# Patient Record
Sex: Male | Born: 1945
Health system: Southern US, Community
[De-identification: ages and names within clinical notes are randomized; demographics above are authoritative.]

## PROBLEM LIST (undated history)

## (undated) DIAGNOSIS — I2510 Atherosclerotic heart disease of native coronary artery without angina pectoris: Secondary | ICD-10-CM

## (undated) DIAGNOSIS — M51379 Other intervertebral disc degeneration, lumbosacral region without mention of lumbar back pain or lower extremity pain: Secondary | ICD-10-CM

## (undated) DIAGNOSIS — K219 Gastro-esophageal reflux disease without esophagitis: Secondary | ICD-10-CM

## (undated) DIAGNOSIS — M5137 Other intervertebral disc degeneration, lumbosacral region: Secondary | ICD-10-CM

## (undated) DIAGNOSIS — M779 Enthesopathy, unspecified: Secondary | ICD-10-CM

## (undated) DIAGNOSIS — Z87442 Personal history of urinary calculi: Secondary | ICD-10-CM

## (undated) DIAGNOSIS — N200 Calculus of kidney: Secondary | ICD-10-CM

## (undated) DIAGNOSIS — I10 Essential (primary) hypertension: Secondary | ICD-10-CM

## (undated) DIAGNOSIS — J301 Allergic rhinitis due to pollen: Secondary | ICD-10-CM

## (undated) DIAGNOSIS — E785 Hyperlipidemia, unspecified: Secondary | ICD-10-CM

## (undated) HISTORY — DX: Personal history of urinary calculi: Z87.442

## (undated) HISTORY — DX: Other intervertebral disc degeneration, lumbosacral region: M51.37

## (undated) HISTORY — DX: Atherosclerotic heart disease of native coronary artery without angina pectoris: I25.10

## (undated) HISTORY — PX: FOOT SURGERY: SHX648

## (undated) HISTORY — PX: TONSILLECTOMY: SHX5217

## (undated) HISTORY — DX: Hyperlipidemia, unspecified: E78.5

## (undated) HISTORY — DX: Allergic rhinitis due to pollen: J30.1

## (undated) HISTORY — DX: Other intervertebral disc degeneration, lumbosacral region without mention of lumbar back pain or lower extremity pain: M51.379

## (undated) HISTORY — DX: Essential (primary) hypertension: I10

## (undated) HISTORY — DX: Enthesopathy, unspecified: M77.9

## (undated) HISTORY — DX: Calculus of kidney: N20.0

## (undated) HISTORY — DX: Gastro-esophageal reflux disease without esophagitis: K21.9

## (undated) HISTORY — PX: OTHER SURGICAL HISTORY: SHX169

---

## 1999-09-15 ENCOUNTER — Emergency Department (HOSPITAL_COMMUNITY): Admission: EM | Admit: 1999-09-15 | Discharge: 1999-09-15 | Payer: Self-pay | Admitting: Emergency Medicine

## 1999-09-16 ENCOUNTER — Encounter: Payer: Self-pay | Admitting: Emergency Medicine

## 2004-03-06 ENCOUNTER — Ambulatory Visit (HOSPITAL_COMMUNITY): Admission: RE | Admit: 2004-03-06 | Discharge: 2004-03-06 | Payer: Self-pay | Admitting: Gastroenterology

## 2004-08-06 ENCOUNTER — Ambulatory Visit: Payer: Self-pay | Admitting: Internal Medicine

## 2004-09-10 ENCOUNTER — Ambulatory Visit: Payer: Self-pay | Admitting: Internal Medicine

## 2004-09-15 ENCOUNTER — Ambulatory Visit: Payer: Self-pay | Admitting: Internal Medicine

## 2004-10-08 ENCOUNTER — Ambulatory Visit: Payer: Self-pay | Admitting: Internal Medicine

## 2005-10-23 ENCOUNTER — Ambulatory Visit: Payer: Self-pay | Admitting: Internal Medicine

## 2005-10-29 ENCOUNTER — Ambulatory Visit: Payer: Self-pay | Admitting: Internal Medicine

## 2006-11-19 ENCOUNTER — Ambulatory Visit: Payer: Self-pay | Admitting: Internal Medicine

## 2006-11-19 LAB — CONVERTED CEMR LAB
ALT: 24 units/L (ref 0–40)
AST: 25 units/L (ref 0–37)
Albumin: 3.4 g/dL — ABNORMAL LOW (ref 3.5–5.2)
Alkaline Phosphatase: 74 units/L (ref 39–117)
BUN: 18 mg/dL (ref 6–23)
Basophils Absolute: 0.1 10*3/uL (ref 0.0–0.1)
Basophils Relative: 0.9 % (ref 0.0–1.0)
Bilirubin Urine: NEGATIVE
Bilirubin, Direct: 0.2 mg/dL (ref 0.0–0.3)
CO2: 29 meq/L (ref 19–32)
Calcium: 8.7 mg/dL (ref 8.4–10.5)
Chloride: 111 meq/L (ref 96–112)
Cholesterol: 162 mg/dL (ref 0–200)
Creatinine, Ser: 0.8 mg/dL (ref 0.4–1.5)
Eosinophils Absolute: 0.4 10*3/uL (ref 0.0–0.6)
Eosinophils Relative: 6.4 % — ABNORMAL HIGH (ref 0.0–5.0)
GFR calc Af Amer: 127 mL/min
GFR calc non Af Amer: 105 mL/min
Glucose, Bld: 110 mg/dL — ABNORMAL HIGH (ref 70–99)
HCT: 40.6 % (ref 39.0–52.0)
HDL: 45.6 mg/dL (ref >39.0)
Hemoglobin, Urine: NEGATIVE
Hemoglobin: 13.8 g/dL (ref 13.0–17.0)
Ketones, ur: NEGATIVE mg/dL
LDL Cholesterol: 103 mg/dL — ABNORMAL HIGH (ref 0–99)
Leukocytes, UA: NEGATIVE
Lymphocytes Relative: 36.5 % (ref 12.0–46.0)
MCHC: 34.1 g/dL (ref 30.0–36.0)
MCV: 91.3 fL (ref 78.0–100.0)
Monocytes Absolute: 0.6 10*3/uL (ref 0.2–0.7)
Monocytes Relative: 9 % (ref 3.0–11.0)
Neutro Abs: 3 10*3/uL (ref 1.4–7.7)
Neutrophils Relative %: 47.2 % (ref 43.0–77.0)
Nitrite: NEGATIVE
PSA: 0.96 ng/mL (ref 0.10–4.00)
Platelets: 254 10*3/uL (ref 150–400)
Potassium: 4 meq/L (ref 3.5–5.1)
RBC: 4.45 M/uL (ref 4.22–5.81)
RDW: 13.1 % (ref 11.5–14.6)
Sodium: 144 meq/L (ref 135–145)
Specific Gravity, Urine: 1.02 (ref 1.000–1.03)
TSH: 2.25 microintl units/mL (ref 0.35–5.50)
Total Bilirubin: 1.5 mg/dL — ABNORMAL HIGH (ref 0.3–1.2)
Total CHOL/HDL Ratio: 3.6
Total Protein, Urine: NEGATIVE mg/dL
Total Protein: 6.6 g/dL (ref 6.0–8.3)
Triglycerides: 69 mg/dL (ref 0–149)
Urine Glucose: NEGATIVE mg/dL
Urobilinogen, UA: 0.2 (ref 0.0–1.0)
VLDL: 14 mg/dL (ref 0–40)
WBC: 6.5 10*3/uL (ref 4.5–10.5)
pH: 7 (ref 5.0–8.0)

## 2006-11-25 ENCOUNTER — Ambulatory Visit: Payer: Self-pay | Admitting: Internal Medicine

## 2007-11-12 DIAGNOSIS — M51379 Other intervertebral disc degeneration, lumbosacral region without mention of lumbar back pain or lower extremity pain: Secondary | ICD-10-CM | POA: Insufficient documentation

## 2007-11-12 DIAGNOSIS — K219 Gastro-esophageal reflux disease without esophagitis: Secondary | ICD-10-CM | POA: Insufficient documentation

## 2007-11-12 DIAGNOSIS — Z9089 Acquired absence of other organs: Secondary | ICD-10-CM | POA: Insufficient documentation

## 2007-11-12 DIAGNOSIS — Z87442 Personal history of urinary calculi: Secondary | ICD-10-CM | POA: Insufficient documentation

## 2007-11-12 DIAGNOSIS — M5137 Other intervertebral disc degeneration, lumbosacral region: Secondary | ICD-10-CM | POA: Insufficient documentation

## 2007-11-12 DIAGNOSIS — J301 Allergic rhinitis due to pollen: Secondary | ICD-10-CM | POA: Insufficient documentation

## 2007-11-12 DIAGNOSIS — M779 Enthesopathy, unspecified: Secondary | ICD-10-CM | POA: Insufficient documentation

## 2007-11-12 DIAGNOSIS — E785 Hyperlipidemia, unspecified: Secondary | ICD-10-CM | POA: Insufficient documentation

## 2007-12-05 ENCOUNTER — Encounter: Payer: Self-pay | Admitting: Internal Medicine

## 2008-02-02 ENCOUNTER — Ambulatory Visit: Payer: Self-pay | Admitting: Internal Medicine

## 2008-02-02 LAB — CONVERTED CEMR LAB
ALT: 21 units/L (ref 0–53)
AST: 21 units/L (ref 0–37)
Albumin: 3.5 g/dL (ref 3.5–5.2)
Alkaline Phosphatase: 75 units/L (ref 39–117)
BUN: 19 mg/dL (ref 6–23)
Basophils Absolute: 0 10*3/uL (ref 0.0–0.1)
Basophils Relative: 0.6 % (ref 0.0–1.0)
Bilirubin Urine: NEGATIVE
Bilirubin, Direct: 0.2 mg/dL (ref 0.0–0.3)
CO2: 31 meq/L (ref 19–32)
Calcium: 9.3 mg/dL (ref 8.4–10.5)
Chloride: 105 meq/L (ref 96–112)
Cholesterol: 164 mg/dL (ref 0–200)
Creatinine, Ser: 0.8 mg/dL (ref 0.4–1.5)
Eosinophils Absolute: 0.3 10*3/uL (ref 0.0–0.7)
Eosinophils Relative: 4.6 % (ref 0.0–5.0)
GFR calc Af Amer: 126 mL/min
GFR calc non Af Amer: 104 mL/min
Glucose, Bld: 114 mg/dL — ABNORMAL HIGH (ref 70–99)
HCT: 41.8 % (ref 39.0–52.0)
HDL: 43 mg/dL (ref >39.0)
Hemoglobin, Urine: NEGATIVE
Hemoglobin: 14.2 g/dL (ref 13.0–17.0)
Ketones, ur: NEGATIVE mg/dL
LDL Cholesterol: 105 mg/dL — ABNORMAL HIGH (ref 0–99)
Leukocytes, UA: NEGATIVE
Lymphocytes Relative: 38 % (ref 12.0–46.0)
MCHC: 34.1 g/dL (ref 30.0–36.0)
MCV: 91.6 fL (ref 78.0–100.0)
Monocytes Absolute: 0.6 10*3/uL (ref 0.1–1.0)
Monocytes Relative: 8.3 % (ref 3.0–12.0)
Neutro Abs: 3.6 10*3/uL (ref 1.4–7.7)
Neutrophils Relative %: 48.5 % (ref 43.0–77.0)
Nitrite: NEGATIVE
PSA: 0.92 ng/mL (ref 0.10–4.00)
Platelets: 252 10*3/uL (ref 150–400)
Potassium: 4.2 meq/L (ref 3.5–5.1)
RBC: 4.56 M/uL (ref 4.22–5.81)
RDW: 13.5 % (ref 11.5–14.6)
Sodium: 141 meq/L (ref 135–145)
Specific Gravity, Urine: >=1.03 (ref 1.000–1.03)
TSH: 1.98 microintl units/mL (ref 0.35–5.50)
Total Bilirubin: 1.5 mg/dL — ABNORMAL HIGH (ref 0.3–1.2)
Total CHOL/HDL Ratio: 3.8
Total Protein, Urine: NEGATIVE mg/dL
Total Protein: 7 g/dL (ref 6.0–8.3)
Triglycerides: 78 mg/dL (ref 0–149)
Urine Glucose: NEGATIVE mg/dL
Urobilinogen, UA: 0.2 (ref 0.0–1.0)
VLDL: 16 mg/dL (ref 0–40)
WBC: 7.2 10*3/uL (ref 4.5–10.5)
pH: 5.5 (ref 5.0–8.0)

## 2008-02-08 ENCOUNTER — Ambulatory Visit: Payer: Self-pay | Admitting: Internal Medicine

## 2008-02-17 ENCOUNTER — Telehealth (INDEPENDENT_AMBULATORY_CARE_PROVIDER_SITE_OTHER): Payer: Self-pay | Admitting: *Deleted

## 2008-02-20 ENCOUNTER — Ambulatory Visit: Payer: Self-pay | Admitting: Internal Medicine

## 2008-02-20 DIAGNOSIS — M542 Cervicalgia: Secondary | ICD-10-CM | POA: Insufficient documentation

## 2008-02-21 ENCOUNTER — Encounter: Admission: RE | Admit: 2008-02-21 | Discharge: 2008-02-21 | Payer: Self-pay | Admitting: Internal Medicine

## 2008-02-26 ENCOUNTER — Encounter: Payer: Self-pay | Admitting: Internal Medicine

## 2008-07-31 ENCOUNTER — Ambulatory Visit: Payer: Self-pay | Admitting: Internal Medicine

## 2008-09-05 ENCOUNTER — Telehealth: Payer: Self-pay | Admitting: Internal Medicine

## 2008-11-22 ENCOUNTER — Encounter: Payer: Self-pay | Admitting: Internal Medicine

## 2009-01-10 IMAGING — CR DG CERVICAL SPINE WITH FLEX & EXTEND
7 series · 7 of 7 positions shown · non-contrast
Comparison: None

CLINICAL DATA: Next a dense

CERVICAL SPINE COMPLETE WITH FLEXION AND EXTENSION VIEWS

[view not recorded (1 of 7)]
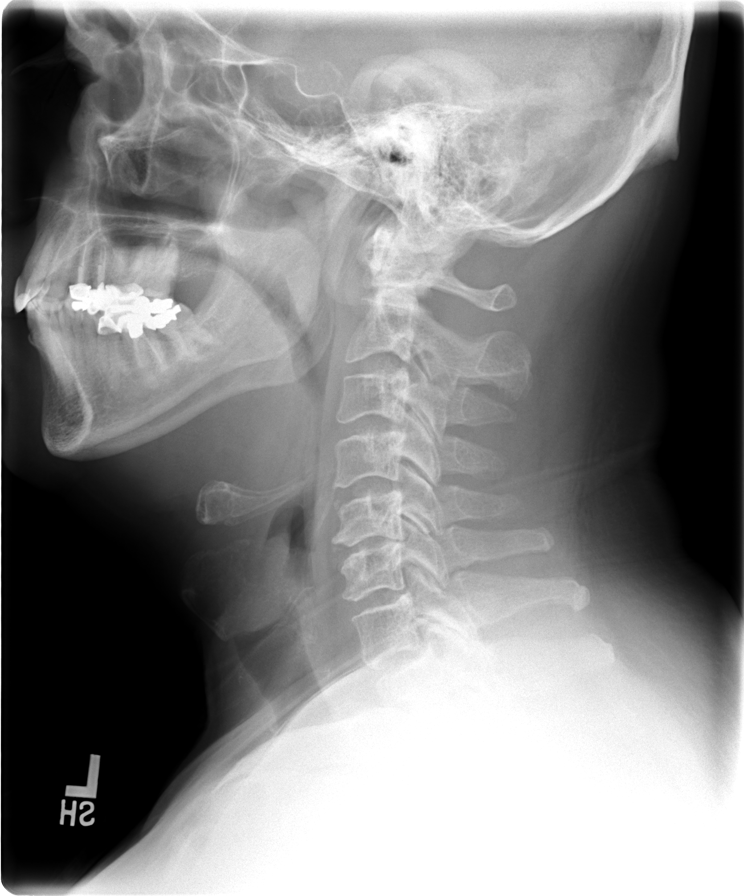

[view not recorded (2 of 7)]
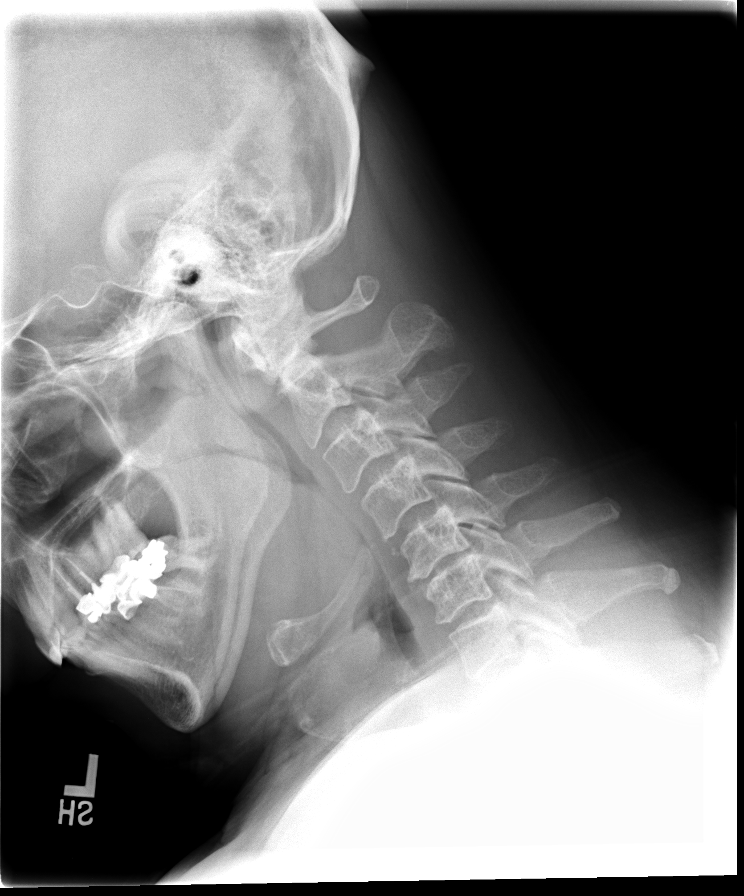

[view not recorded (3 of 7)]
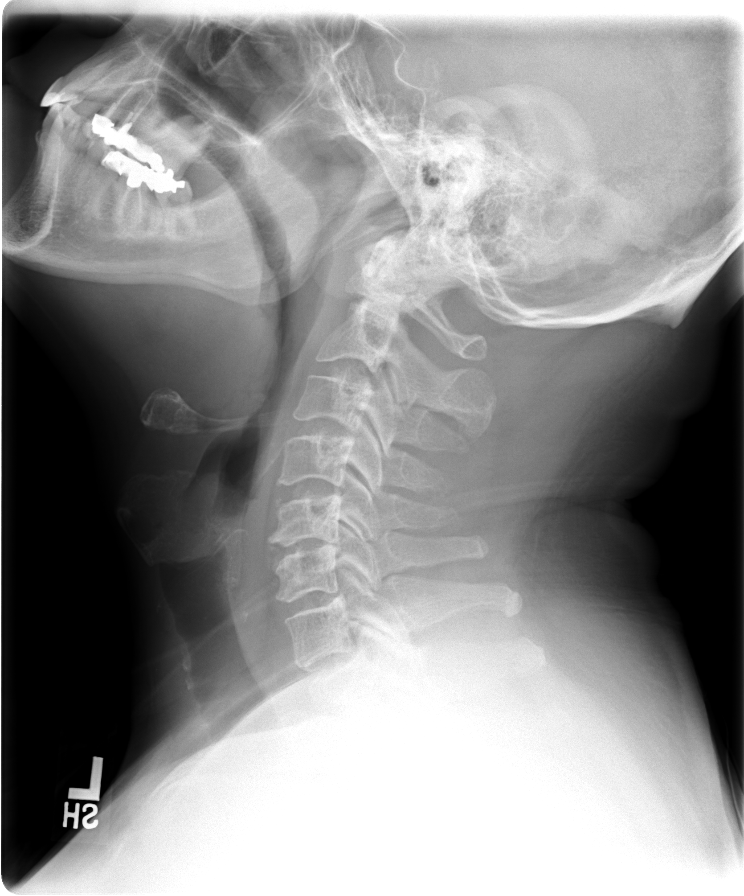

[view not recorded (4 of 7)]
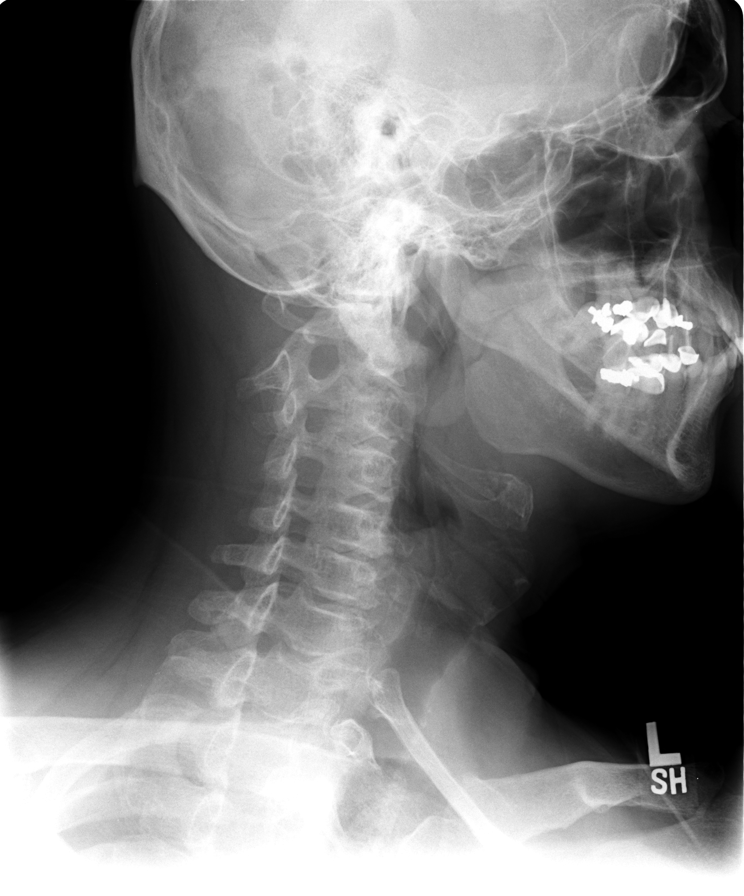

[view not recorded (5 of 7)]
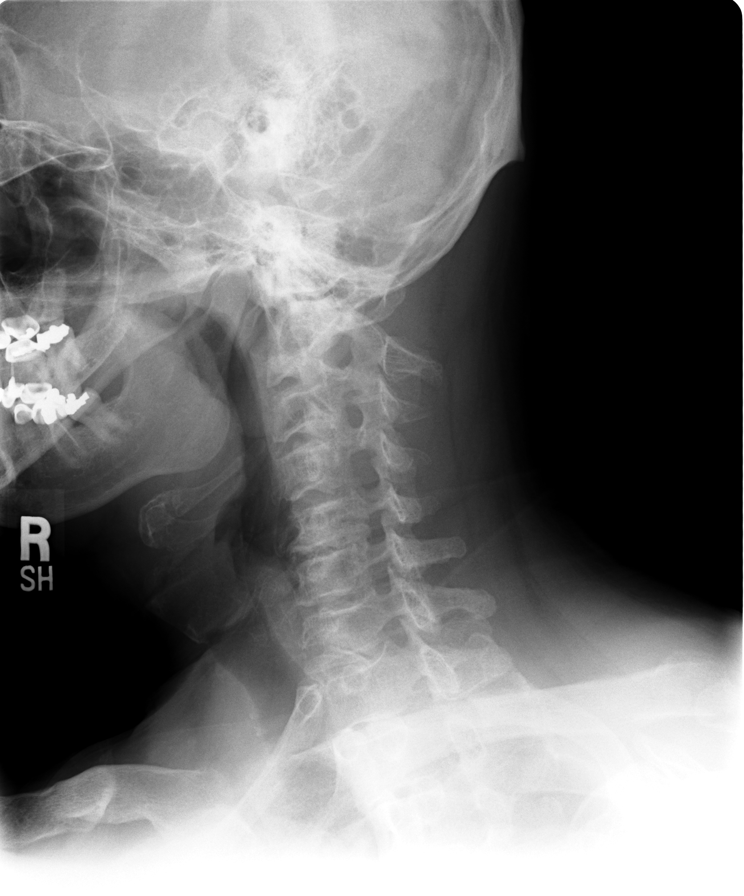

[view not recorded (6 of 7)]
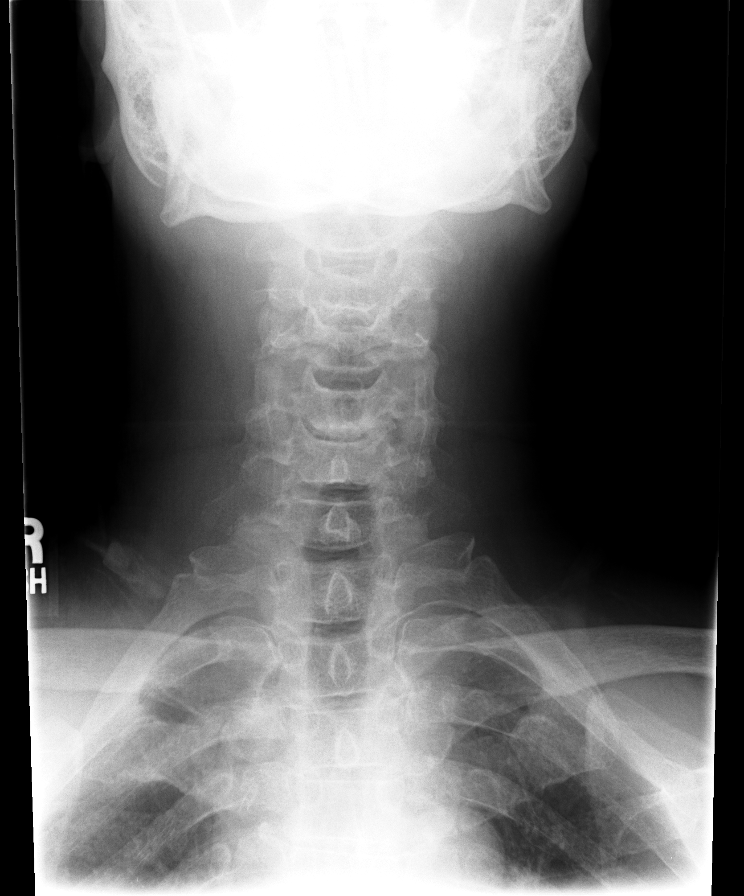

[view not recorded (7 of 7)]
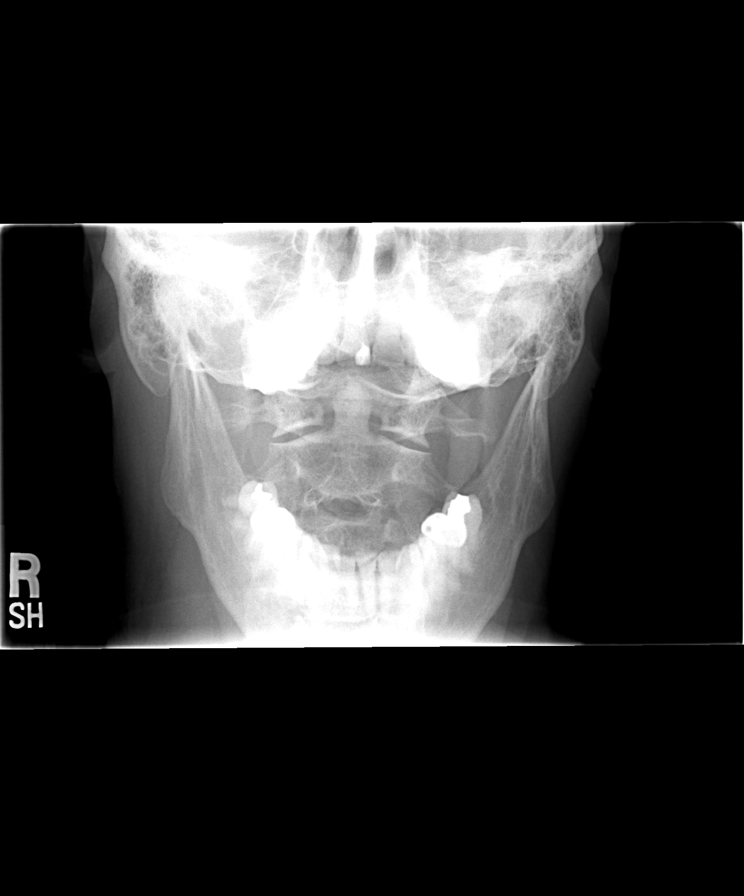

[7 of 7 positions shown; findings below may reference images not displayed]

FINDINGS: There is severe narrowing of the C5-C6 disc.  There is no
vertebral body height loss.  There is anatomic alignment.  Mild
narrowing at C6-7 is noted.  Flexion and extension views
demonstrate slight anterolisthesis at C4-5 upon flexion.  No
obvious instability is noted.  Mild narrowing of the left C5-6
foramen is noted due to uncovertebral osteophytes.  The odontoid is
intact.  No obvious fractures.  Soft tissues are within normal
limits.
IMPRESSION: Degenerative disc disease at C5-6 and to a lesser degree C6-7 as
described.

## 2009-02-11 ENCOUNTER — Ambulatory Visit: Payer: Self-pay | Admitting: Internal Medicine

## 2009-02-11 LAB — CONVERTED CEMR LAB
ALT: 20 units/L (ref 0–53)
AST: 23 units/L (ref 0–37)
Albumin: 3.5 g/dL (ref 3.5–5.2)
Alkaline Phosphatase: 72 units/L (ref 39–117)
BUN: 18 mg/dL (ref 6–23)
Basophils Absolute: 0 10*3/uL (ref 0.0–0.1)
Basophils Relative: 0.5 % (ref 0.0–3.0)
Bilirubin Urine: NEGATIVE
Bilirubin, Direct: 0.2 mg/dL (ref 0.0–0.3)
CO2: 31 meq/L (ref 19–32)
Calcium: 9 mg/dL (ref 8.4–10.5)
Chloride: 103 meq/L (ref 96–112)
Cholesterol: 181 mg/dL (ref 0–200)
Creatinine, Ser: 0.8 mg/dL (ref 0.4–1.5)
Eosinophils Absolute: 0.6 10*3/uL (ref 0.0–0.7)
Eosinophils Relative: 8.6 % — ABNORMAL HIGH (ref 0.0–5.0)
GFR calc non Af Amer: 104.02 mL/min (ref >60)
Glucose, Bld: 108 mg/dL — ABNORMAL HIGH (ref 70–99)
HCT: 40.7 % (ref 39.0–52.0)
HDL: 47.7 mg/dL (ref >39.00)
Hemoglobin, Urine: NEGATIVE
Hemoglobin: 14.3 g/dL (ref 13.0–17.0)
Ketones, ur: NEGATIVE mg/dL
LDL Cholesterol: 120 mg/dL — ABNORMAL HIGH (ref 0–99)
Leukocytes, UA: NEGATIVE
Lymphocytes Relative: 36.3 % (ref 12.0–46.0)
Lymphs Abs: 2.5 10*3/uL (ref 0.7–4.0)
MCHC: 35.2 g/dL (ref 30.0–36.0)
MCV: 90.8 fL (ref 78.0–100.0)
Monocytes Absolute: 0.6 10*3/uL (ref 0.1–1.0)
Monocytes Relative: 9.2 % (ref 3.0–12.0)
Neutro Abs: 3.1 10*3/uL (ref 1.4–7.7)
Neutrophils Relative %: 45.4 % (ref 43.0–77.0)
Nitrite: NEGATIVE
PSA: 0.89 ng/mL (ref 0.10–4.00)
Platelets: 230 10*3/uL (ref 150.0–400.0)
Potassium: 4.1 meq/L (ref 3.5–5.1)
RBC: 4.48 M/uL (ref 4.22–5.81)
RDW: 13.3 % (ref 11.5–14.6)
Sodium: 140 meq/L (ref 135–145)
Specific Gravity, Urine: 1.02 (ref 1.000–1.030)
TSH: 4.32 microintl units/mL (ref 0.35–5.50)
Total Bilirubin: 1.4 mg/dL — ABNORMAL HIGH (ref 0.3–1.2)
Total CHOL/HDL Ratio: 4
Total Protein, Urine: NEGATIVE mg/dL
Total Protein: 7.2 g/dL (ref 6.0–8.3)
Triglycerides: 67 mg/dL (ref 0.0–149.0)
Urine Glucose: NEGATIVE mg/dL
Urobilinogen, UA: 0.2 (ref 0.0–1.0)
VLDL: 13.4 mg/dL (ref 0.0–40.0)
WBC: 6.8 10*3/uL (ref 4.5–10.5)
pH: 6.5 (ref 5.0–8.0)

## 2009-02-15 ENCOUNTER — Ambulatory Visit: Payer: Self-pay | Admitting: Internal Medicine

## 2009-04-11 ENCOUNTER — Telehealth: Payer: Self-pay | Admitting: Internal Medicine

## 2009-08-30 ENCOUNTER — Telehealth: Payer: Self-pay | Admitting: Internal Medicine

## 2009-09-11 ENCOUNTER — Telehealth: Payer: Self-pay | Admitting: Internal Medicine

## 2010-02-11 ENCOUNTER — Ambulatory Visit: Payer: Self-pay | Admitting: Internal Medicine

## 2010-02-11 LAB — CONVERTED CEMR LAB
ALT: 99 units/L — ABNORMAL HIGH (ref 0–53)
AST: 45 units/L — ABNORMAL HIGH (ref 0–37)
Albumin: 3.5 g/dL (ref 3.5–5.2)
Alkaline Phosphatase: 180 units/L — ABNORMAL HIGH (ref 39–117)
BUN: 21 mg/dL (ref 6–23)
Basophils Absolute: 0.1 10*3/uL (ref 0.0–0.1)
Basophils Relative: 1 % (ref 0.0–3.0)
Bilirubin Urine: NEGATIVE
Bilirubin, Direct: 0.2 mg/dL (ref 0.0–0.3)
CO2: 29 meq/L (ref 19–32)
Calcium: 8.7 mg/dL (ref 8.4–10.5)
Chloride: 108 meq/L (ref 96–112)
Cholesterol: 157 mg/dL (ref 0–200)
Creatinine, Ser: 0.9 mg/dL (ref 0.4–1.5)
Eosinophils Absolute: 0.7 10*3/uL (ref 0.0–0.7)
Eosinophils Relative: 10.7 % — ABNORMAL HIGH (ref 0.0–5.0)
GFR calc non Af Amer: 94.12 mL/min (ref >60)
Glucose, Bld: 108 mg/dL — ABNORMAL HIGH (ref 70–99)
HCT: 40.8 % (ref 39.0–52.0)
HDL: 44.4 mg/dL (ref >39.00)
Hemoglobin, Urine: NEGATIVE
Hemoglobin: 14 g/dL (ref 13.0–17.0)
Ketones, ur: NEGATIVE mg/dL
LDL Cholesterol: 91 mg/dL (ref 0–99)
Leukocytes, UA: NEGATIVE
Lymphocytes Relative: 34.1 % (ref 12.0–46.0)
Lymphs Abs: 2.3 10*3/uL (ref 0.7–4.0)
MCHC: 34.3 g/dL (ref 30.0–36.0)
MCV: 91.5 fL (ref 78.0–100.0)
Monocytes Absolute: 0.8 10*3/uL (ref 0.1–1.0)
Monocytes Relative: 11.7 % (ref 3.0–12.0)
Neutro Abs: 2.9 10*3/uL (ref 1.4–7.7)
Neutrophils Relative %: 42.5 % — ABNORMAL LOW (ref 43.0–77.0)
Nitrite: NEGATIVE
PSA: 1.17 ng/mL (ref 0.10–4.00)
Platelets: 247 10*3/uL (ref 150.0–400.0)
Potassium: 4.4 meq/L (ref 3.5–5.1)
RBC: 4.46 M/uL (ref 4.22–5.81)
RDW: 14.9 % — ABNORMAL HIGH (ref 11.5–14.6)
Sodium: 143 meq/L (ref 135–145)
Specific Gravity, Urine: >=1.03 (ref 1.000–1.030)
TSH: 3.59 microintl units/mL (ref 0.35–5.50)
Total Bilirubin: 1 mg/dL (ref 0.3–1.2)
Total CHOL/HDL Ratio: 4
Total Protein, Urine: NEGATIVE mg/dL
Total Protein: 6.9 g/dL (ref 6.0–8.3)
Triglycerides: 110 mg/dL (ref 0.0–149.0)
Urine Glucose: NEGATIVE mg/dL
Urobilinogen, UA: 0.2 (ref 0.0–1.0)
VLDL: 22 mg/dL (ref 0.0–40.0)
WBC: 6.8 10*3/uL (ref 4.5–10.5)
pH: 5.5 (ref 5.0–8.0)

## 2010-02-17 ENCOUNTER — Ambulatory Visit: Payer: Self-pay | Admitting: Internal Medicine

## 2010-08-19 ENCOUNTER — Ambulatory Visit: Payer: Self-pay | Admitting: Internal Medicine

## 2010-08-24 HISTORY — PX: SHOULDER ARTHROSCOPY: SHX128

## 2010-09-24 ENCOUNTER — Ambulatory Visit (INDEPENDENT_AMBULATORY_CARE_PROVIDER_SITE_OTHER): Payer: BC Managed Care – PPO | Admitting: Internal Medicine

## 2010-09-24 ENCOUNTER — Encounter: Payer: Self-pay | Admitting: Internal Medicine

## 2010-09-24 DIAGNOSIS — L02419 Cutaneous abscess of limb, unspecified: Secondary | ICD-10-CM

## 2010-09-24 DIAGNOSIS — L03119 Cellulitis of unspecified part of limb: Secondary | ICD-10-CM

## 2010-09-25 NOTE — Progress Notes (Signed)
  Phone Note Call from Patient Call back at Home Phone [phone]   Caller: Patient Call For: Dr Debby Bud Summary of Call: Pt states he needs Nexium, [facility] [address], [address] [phone]. This is his new pharmacy not [facility]. Initial call taken by: Verdell Face,  September 11, 2009 9:53 AM    Prescriptions: NEXIUM 40 MG  CPDR (ESOMEPRAZOLE MAGNESIUM) Take 1 tablet by mouth every morning  #90 x 1   Entered by:   Ami Bullins CMA   Authorized by:   Jacques Navy MD   Signed by:   Bill Salinas CMA on 09/11/2009   Method used:   Electronically to        [id number] ([facility]ail[facility]       [address] [place], [place]  [address]       Ph: [phone]       Fax: [phone]   RxID:   [id number]

## 2010-09-25 NOTE — Progress Notes (Signed)
  Phone Note Refill Request Message from:  Fax from Pharmacy on August 30, 2009 9:13 AM  Refills Requested: Medication #1:  CRESTOR 5 MG TABS Take 1 tablet by mouth once a day. Initial call taken by: Ami Bullins CMA,  August 30, 2009 9:13 AM    Prescriptions: CRESTOR 5 MG TABS (ROSUVASTATIN CALCIUM) Take 1 tablet by mouth once a day  #90 x 3   Entered by:   Ami Bullins CMA   Authorized by:   Jacques Navy MD   Signed by:   Bill Salinas CMA on 08/30/2009   Method used:   Electronically to        [facility] # [id number]* (retail)       [address]       [place], [place]  [address]       Ph: [phone] or [phone]       Fax: [phone]   RxID:   [id number]

## 2010-09-25 NOTE — Assessment & Plan Note (Signed)
Summary: LEG BOIL--MRSA???--REFUSED ANOTHER MD--[facility]C   Vital Signs:  Patient profile:   65 year old male Height:      68 inches Weight:      216 pounds BMI:     32.96 O2 Sat:      97 % on Room air Temp:     97.7 degrees F oral Pulse rate:   65 / minute BP sitting:   142 / 78  (left arm) Cuff size:   regular  Vitals Entered By: Bill Salinas CMA (August 19, 2010 8:47 AM)  O2 Flow:  Room air CC: pt here for evaluation of boil on his right leg/ ab   Primary Care Provider:  Norins  CC:  pt here for evaluation of boil on his right leg/ ab.  History of Present Illness: Patinet had a furncle on the rigft lower leg several weeks ago for which he went to Urgent Care. Culture came back MRSA. He was treated with TMP/SMX which he did not tolerate and was switched to Doxy with good results.  Last week he developed a sore on the right lower leg that became swollen, red and drained purulent material. He has been applying warm soaks and dressing the wound. He has had increased swelling of the calve and tenderness. He denies any fever or rigors or pain in the calve. He has had no ascending erythema and no enlarged lymph nodes in the groin.   He is concerned about his left testicle having a lump.   Current Medications (verified): 1)  Nexium 40 Mg  Cpdr (Esomeprazole Magnesium) .... Take 1 Tablet By Mouth Every Morning 2)  Adult Aspirin Ec Low Strength 81 Mg Tbec (Aspirin) .Marland Kitchen.. 1 By Mouth Once Daily 3)  Crestor 5 Mg Tabs (Rosuvastatin Calcium) .... Take 1 Tablet By Mouth Once A Day 4)  Celebrex 200 Mg Caps (Celecoxib) .Marland Kitchen.. 1 By Mouth Once Daily  Allergies (verified): No Known Drug Allergies  Past History:  Past Medical History: Last updated: 11/12/2007 DISC DISEASE, LUMBAR (ICD-722.52) NEPHROLITHIASIS, HX OF (ICD-V13.01) ALLERGIC RHINITIS, SEASONAL (ICD-477.0) HYPERLIPIDEMIA (ICD-272.4) Hx of TENDINITIS (ICD-726.90) GASTROESOPHAGEAL REFLUX DISEASE (ICD-530.81)    Past Surgical  History: Last updated: 11/12/2007 TONSILLECTOMY, HX OF (ICD-V45.79) FOOT SURGERY, HX OF FOR PLANTAR FASCIITIS (ICD-V15.89)  Family History: Last updated: 02/21/2008 father- deceased @84 : C.Diff, COPD, CAD, CHF, HTN, DM mother - deceased @ 43: alzheimer's, CVD, DM Neg- colon or prostate cancer  Social History: Last updated: 02-21-2008 HSG Married '74- 7 years divorced; married '84 1 son - '79,  daughter '79;  2 grandsons, 3 step-grandchildren work: [employer], 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  Review of Systems Derm:  Complains of changes in color of skin, lesion(s), and poor wound healing; denies changes in nail beds, dryness, excessive perspiration, flushing, hair loss, insect bite(s), and itching.  Physical Exam  General:  alert, well-developed, well-nourished, and well-hydrated.   Eyes:  C&S clear Lungs:  normal respiratory effort and normal breath sounds.   Heart:  normal rate and regular rhythm.   Skin:  3 cm diameter lesion distal right LE with erythema. There is frank purulent drainage. there is surrounding erythema extending 2-3 cm from the wound.  Psych:  Oriented X3, normally interactive, and good eye contact.     Impression & Recommendations:  Problem # 1:  CARBUNCLE AND FURUNCLE OF LEG EXCEPT FOOT (ICD-680.6) lesion on the same leg as a MRSA abscess several weeks ago. the wound looks angry with surrounding erythema  Plan - presumed MRSA -  doxycycline 100mg  two times a day for 10 days           warm soaks with gentle manipulation followed by vigorous washing with soap and water           apply  2 x 2 gauze and cover with bandaid           if not improved  by Friday (after 3 days treatment) will need reassessment and possible admission for IV antibiotics.   Complete Medication List: 1)  Nexium 40 Mg Cpdr (Esomeprazole magnesium) .... Take 1 tablet by mouth every morning 2)  Adult Aspirin Ec Low Strength 81 Mg Tbec (Aspirin) .Marland Kitchen.. 1 by mouth once daily 3)   Crestor 5 Mg Tabs (Rosuvastatin calcium) .... Take 1 tablet by mouth once a day 4)  Celebrex 200 Mg Caps (Celecoxib) .Marland Kitchen.. 1 by mouth once daily 5)  Doxycycline Hyclate 100 Mg Caps (Doxycycline hyclate) .Marland Kitchen.. 1 by mouth two times a day x 10 days for presumed mrsa skin infection Prescriptions: DOXYCYCLINE HYCLATE 100 MG CAPS (DOXYCYCLINE HYCLATE) 1 by mouth two times a day x 10 days for presumed MRSA skin infection  #20 x 0   Entered and Authorized by:   Jacques Navy MD   Signed by:   Jacques Navy MD on 08/19/2010   Method used:   [facility]ron[facility] to        [facility] [phone][facility] (retail)       [address] [place], [place]  [address]       Ph: [phone]       Fax: [phone]   RxID:   [id number]    Orders Added: 1)  Est. Patient Level III [25956]

## 2010-09-25 NOTE — Assessment & Plan Note (Signed)
Summary: CPX/BCBS/#/CD   Vital Signs:  Patient profile:   65 year old male Height:      68 inches Weight:      211 pounds BMI:     32.20 O2 Sat:      96 % on Room air Temp:     97.7 degrees F oral Pulse rate:   61 / minute BP sitting:   130 / 74  (left arm) Cuff size:   regular  Vitals Entered By: Bill Salinas CMA (February 17, 2010 10:04 AM)  O2 Flow:  Room air CC: pt here for cpx, he is no longer taking simvastatin or levaquin/ ab  Vision Screening:      Vision Comments: Last eye exam with in the past 2 years at [facility] with a normal exam   Primary Care Provider:  Norins  CC:  pt here for cpx and he is no longer taking simvastatin or levaquin/ ab.  History of Present Illness: Been having a lot of back pain. He has seen two chiropractors and had x-rays and has been told he has DDD at multiple level in the lumbar spine. He has not had any radiation of pain to his legs. He did have a HNP in the past which responded to oral steroids and exercise.   Preventive Screening-Counseling & Management  Alcohol-Tobacco     Smoking Status: quit  Caffeine-Diet-Exercise     Diet Comments: needs calorie restriction     Diet Counseling: to improve diet; diet is suboptimal     Does Patient Exercise: yes     Type of exercise: golf     Exercise (avg: min/session): >60     Times/week: 4  Current Medications (verified): 1)  Nexium 40 Mg  Cpdr (Esomeprazole Magnesium) .... Take 1 Tablet By Mouth Every Morning 2)  Simvastatin 40 Mg  Tabs (Simvastatin) .... Take 1/2 Tablet Daily 3)  Levaquin 500 Mg Tabs (Levofloxacin) .Marland Kitchen.. 1po Once Daily 4)  Adult Aspirin Ec Low Strength 81 Mg Tbec (Aspirin) .Marland Kitchen.. 1 By Mouth Once Daily 5)  Crestor 5 Mg Tabs (Rosuvastatin Calcium) .... Take 1 Tablet By Mouth Once A Day  Allergies (verified): No Known Drug Allergies  Past History:  Past Medical History: Last updated: 11/12/2007 DISC DISEASE, LUMBAR (ICD-722.52) NEPHROLITHIASIS, HX OF  (ICD-V13.01) ALLERGIC RHINITIS, SEASONAL (ICD-477.0) HYPERLIPIDEMIA (ICD-272.4) Hx of TENDINITIS (ICD-726.90) GASTROESOPHAGEAL REFLUX DISEASE (ICD-530.81)    Past Surgical History: Last updated: 11/12/2007 TONSILLECTOMY, HX OF (ICD-V45.79) FOOT SURGERY, HX OF FOR PLANTAR FASCIITIS (ICD-V15.89)  Family History: Last updated: 02/22/08 father- deceased @84 : C.Diff, COPD, CAD, CHF, HTN, DM mother - deceased @ 37: alzheimer's, CVD, DM Neg- colon or prostate cancer  Social History: Last updated: 2008-02-22 HSG Married '74- 7 years divorced; married '84 1 son - '79,  daughter '79;  2 grandsons, 3 step-grandchildren work: [employer], 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  Social History: Smoking Status:  quit  Review of Systems  The patient denies anorexia, fever, weight loss, weight gain, vision loss, decreased hearing, chest pain, syncope, dyspnea on exertion, prolonged cough, abdominal pain, severe indigestion/heartburn, hematuria, incontinence, difficulty walking, unusual weight change, angioedema, and testicular masses.    Physical Exam  General:  overweight white male in no distress Head:  normocephalic and atraumatic.   Eyes:  vision grossly intact, pupils equal, pupils round, pupils react to accomodation, and corneas and lenses clear.   Ears:  R ear normal and L ear normal.   Nose:  no external deformity and no external erythema.  Mouth:  Oral mucosa and oropharynx without lesions or exudates.  Teeth in good repair. Neck:  supple, full ROM, no thyromegaly, and no carotid bruits.   Chest Wall:  No deformities, masses, tenderness or gynecomastia noted. Lungs:  Normal respiratory effort, chest expands symmetrically. Lungs are clear to auscultation, no crackles or wheezes. Heart:  Normal rate and regular rhythm. S1 and S2 normal without gallop, murmur, click, rub or other extra sounds. Abdomen:  soft, non-tender, normal bowel sounds, no guarding, no rigidity, and no  hepatomegaly.   Msk:  normal ROM, no joint tenderness, no joint swelling, no joint warmth, no joint deformities, and no joint instability.   Pulses:  2+ radial and DP pulses Extremities:  No clubbing, cyanosis, edema, or deformity noted with normal full range of motion of all joints.   Neurologic:  alert & oriented X3, cranial nerves II-XII intact, strength normal in all extremities, sensation intact to pinprick, gait normal, and DTRs symmetrical and normal.   Skin:  turgor normal, color normal, no rashes, no petechiae, and no ulcerations.   Cervical Nodes:  no anterior cervical adenopathy and no posterior cervical adenopathy.   Axillary Nodes:  no R axillary adenopathy and no L axillary adenopathy.   Inguinal Nodes:  no R inguinal adenopathy and no L inguinal adenopathy.   Psych:  Oriented X3, memory intact for recent and remote, and good eye contact.     Impression & Recommendations:  Problem # 1:  CERVICALGIA (ICD-723.1) stable with no c/o neck pain at today's visit  His updated medication list for this problem includes:    Adult Aspirin Ec Low Strength 81 Mg Tbec (Aspirin) .Marland Kitchen... 1 by mouth once daily    Celebrex 200 Mg Caps (Celecoxib) .Marland Kitchen... 1 by mouth once daily  Problem # 2:  DISC DISEASE, LUMBAR (ICD-722.52) Patient with chronic low back pain for which he has monthly adjustments. In the past he has had good relief with celebrex.  Plan - DAILY back exercises           Celebrex 200mg  once daily   Problem # 3:  HYPERLIPIDEMIA (ICD-272.4)  Tolerating meds well.  His updated medication list for this problem includes:    Simvastatin 40 Mg Tabs (Simvastatin) .Marland Kitchen... Take 1/2 tablet daily    Crestor 5 Mg Tabs (Rosuvastatin calcium) .Marland Kitchen... Take 1 tablet by mouth once a day  Labs Reviewed: SGOT: 45 (02/11/2010)   SGPT: 99 (02/11/2010)   HDL:44.40 (02/11/2010), 47.70 (02/11/2009)  LDL:91 (02/11/2010), 120 (45/40/9811)  Chol:157 (02/11/2010), 181 (02/11/2009)  Trig:110.0 (02/11/2010),  67.0 (02/11/2009)  Good control. He will continue crestor 5mg  once daily   Problem # 4:  GASTROESOPHAGEAL REFLUX DISEASE (ICD-530.81) Well controlled with nexium daily.  His updated medication list for this problem includes:    Nexium 40 Mg Cpdr (Esomeprazole magnesium) .Marland Kitchen... Take 1 tablet by mouth every morning  Problem # 5:  NONSPEC ELEVATION OF LEVELS OF TRANSAMINASE/LDH (ICD-790.4) Patient with minor elevation of transaminases. He does report that just prior to having lab drawn he had a brief flu-like illness. Reviewed chart - last LFTs were normal.  Plan - repeat LFTs in 3 months.   Problem # 6:  Preventive Health Care (ICD-V70.0) Assessment: New Unremarkable history except for back pain. Exam is normal except for  weight issue - patient encouraged to reduce portion size, read "Younger Next Year," and to loose 11 lbs by his next annual exam.  Lab results are normal except for liver functions as noted above.  He is current with colonoscopy - last study in '05. He is given tetnus and shingles vaccine today. Current with prostate cancer screening with a normal PSA.  In summary- a nice man who is medically stable. He needs to do daily back care; manage his weight with portion size restriction and smart food choices. He will return as needed or 1 year.   Complete Medication List: 1)  Nexium 40 Mg Cpdr (Esomeprazole magnesium) .... Take 1 tablet by mouth every morning 2)  Simvastatin 40 Mg Tabs (Simvastatin) .... Take 1/2 tablet daily 3)  Adult Aspirin Ec Low Strength 81 Mg Tbec (Aspirin) .Marland Kitchen.. 1 by mouth once daily 4)  Crestor 5 Mg Tabs (Rosuvastatin calcium) .... Take 1 tablet by mouth once a day 5)  Celebrex 200 Mg Caps (Celecoxib) .Marland Kitchen.. 1 by mouth once daily  Other Orders: Tdap => 68yrs IM (88416) Admin 1st Vaccine (60630) Zoster (Shingles) Vaccine Live [phone]) Admin of Any Addtl Vaccine (93235)  Patient: Bryan Hays Note: All result statuses are Final unless otherwise  noted.  Tests: (1) BMP (METABOL)   Sodium                    143 mEq/L                   135-145   Potassium                 4.4 mEq/L                   3.5-5.1   Chloride                  108 mEq/L                   96-112   Carbon Dioxide            29 mEq/L                    19-32   Glucose              [H]  108 mg/dL                   57-32   BUN                       21 mg/dL                    2-02   Creatinine                0.9 mg/dL                   5.4-2.7   Calcium                   8.7 mg/dL                   0.6-23.7   GFR                       94.12 mL/min                >60  Tests: (2) Lipid Panel (LIPID)   Cholesterol               157 mg/dL                   6-283  ATP III Classification            Desirable:  < 200 mg/dL                    Borderline High:  200 - 239 mg/dL               High:  > = 240 mg/dL   Triglycerides             110.0 mg/dL                 1.9-147.8     Normal:  <150 mg/dL     Borderline High:  295 - 199 mg/dL   HDL                       62.13 mg/dL                 >08.65   VLDL Cholesterol          22.0 mg/dL                  7.8-46.9   LDL Cholesterol           91 mg/dL                    6-29  CHO/HDL Ratio:  CHD Risk                             4                    Men          Women     1/2 Average Risk     3.4          3.3     Average Risk          5.0          4.4     2X Average Risk          9.6          7.1     3X Average Risk          15.0          11.0                           Tests: (3) CBC Platelet w/Diff (CBCD)   White Cell Count          6.8 K/uL                    4.5-10.5   Red Cell Count            4.46 Mil/uL                 4.22-5.81   Hemoglobin                14.0 g/dL                   52.8-41.3   Hematocrit                40.8 %                      39.0-52.0   MCV  91.5 fl                     78.0-100.0   MCHC                      34.3 g/dL                   04.5-40.9   RDW                   [H]  14.9 %                      11.5-14.6   Platelet Count            247.0 K/uL                  150.0-400.0   Neutrophil %         [L]  42.5 %                      43.0-77.0   Lymphocyte %              34.1 %                      12.0-46.0   Monocyte %                11.7 %                      3.0-12.0   Eosinophils%         [H]  10.7 %                      0.0-5.0   Basophils %               1.0 %                       0.0-3.0   Neutrophill Absolute      2.9 K/uL                    1.4-7.7   Lymphocyte Absolute       2.3 K/uL                    0.7-4.0   Monocyte Absolute         0.8 K/uL                    0.1-1.0  Eosinophils, Absolute                             0.7 K/uL                    0.0-0.7   Basophils Absolute        0.1 K/uL                    0.0-0.1  Tests: (4) Hepatic/Liver Function Panel (HEPATIC)   Total Bilirubin           1.0 mg/dL                   8.1-1.9   Direct Bilirubin          0.2 mg/dL  0.0-0.3   Alkaline Phosphatase [H]  180 U/L                     39-117   AST                  [H]  45 U/L                      0-37   ALT                  [H]  99 U/L                      0-53   Total Protein             6.9 g/dL                    1.4-7.8   Albumin                   3.5 g/dL                    2.9-5.6  Tests: (5) TSH (TSH)   FastTSH                   3.59 uIU/mL                 0.35-5.50  Tests: (6) Prostate Specific Antigen (PSA)   PSA-Hyb                   1.17 ng/mL                  0.10-4.00  Tests: (7) UDip Only (UDIP)   Color                     YELLOW       RANGE:  Yellow;Lt. Yellow   Clarity                   CLEAR                       Clear   Specific Gravity          >=1.030                     1.000 - 1.030   Urine Ph                  5.5                         5.0-8.0   Protein                   NEGATIVE                    Negative   Urine Glucose             NEGATIVE                    Negative   Ketones                    NEGATIVE                    Negative   Urine Bilirubin  NEGATIVE                    Negative   Blood                     NEGATIVE                    Negative   Urobilinogen              0.2                         0.0 - 1.0   Leukocyte Esterace        NEGATIVE                    Negative   Nitrite                   NEGATIVE                    NegativePrescriptions: CRESTOR 5 MG TABS (ROSUVASTATIN CALCIUM) Take 1 tablet by mouth once a day  #90 x 3   Entered and Authorized by:   Jacques Navy MD   Signed by:   Jacques Navy MD on 02/17/2010   Method used:   Electronically to        [facility] [phone]* (retail)       [address] [place], [place]  [address]       Ph: [phone]       Fax: [phone]   RxID:   [id number] NEXIUM 40 MG  CPDR (ESOMEPRAZOLE MAGNESIUM) Take 1 tablet by mouth every morning  #90 x 1   Entered and Authorized by:   Jacques Navy MD   Signed by:   Jacques Navy MD on 02/17/2010   Method used:   Electronically to        [facility] # [phone]* (retail)       [address] [place], [place]  [address]       Ph: [phone]       Fax: [phone]   RxID:   [id number] CELEBREX 200 MG CAPS (CELECOXIB) 1 by mouth once daily  #90 x 3   Entered and Authorized by:   Jacques Navy MD   Signed by:   Jacques Navy MD on 02/17/2010   Method used:   Electronically to        [facility] [phone]* (retail)       [address] [place], [place]  [address]       Ph: [phone]       Fax: [phone]   RxID:   [id number]    Immunization History:  Influenza Immunization History:    Influenza:  declined (02/17/2010)  Immunizations Administered:  Tetanus Vaccine:    Vaccine Type: Tdap    Site: left deltoid    Mfr: GlaxoSmithKline    Dose: 0.5 ml    Route: IM    Given by: Ami Bullins CMA    Exp. Date: 11/16/2011    Lot #: [id number]    VIS given: 07/12/07 version given  February 17, 2010.  Zostavax # 1:    Vaccine Type: Zostavax    Site: right arm    Mfr: Merck  Dose: 0.5 ml    Route: [place]    Given by: Ami Bullins CMA    Exp. Date: 01/31/2011    Lot #: [id number]    VIS given: 06/05/05 given February 17, 2010.

## 2010-10-01 NOTE — Assessment & Plan Note (Signed)
Summary: r calf boil,hx MRSA   Vital Signs:  Patient profile:   65 year old male Height:      68 inches Weight:      216 pounds BMI:     32.96 O2 Sat:      97 % on Room air Temp:     97.6 degrees F oral Pulse rate:   67 / minute BP sitting:   126 / 80  (left arm) Cuff size:   regular  Vitals Entered By: Bill Salinas CMA (September 24, 2010 11:29 AM)  O2 Flow:  Room air CC: pt here for evalution of severel spots he is concerned with on his right leg. Pt had spot on his right leg that had tested postive for MRSA and two more spots have popped up/ ab   Primary Care Provider:  Jamion Carter  CC:  pt here for evalution of severel spots he is concerned with on his right leg. Pt had spot on his right leg that had tested postive for MRSA and two more spots have popped up/ ab.  History of Present Illness: Patient returns with a new infection on the right leg. He noticed an area of swelling, redness and tenderness several days ago. With lack of response to neosporin and bandaid he presents for treatment. He has had a previous MRSA infection on the right leg.   Current Medications (verified): 1)  Nexium 40 Mg  Cpdr (Esomeprazole Magnesium) .... Take 1 Tablet By Mouth Every Morning 2)  Adult Aspirin Ec Low Strength 81 Mg Tbec (Aspirin) .Marland Kitchen.. 1 By Mouth Once Daily 3)  Crestor 5 Mg Tabs (Rosuvastatin Calcium) .... Take 1 Tablet By Mouth Once A Day 4)  Celebrex 200 Mg Caps (Celecoxib) .Marland Kitchen.. 1 By Mouth Once Daily  Allergies (verified): No Known Drug Allergies PMH-FH-SH reviewed-no changes except otherwise noted  Review of Systems  The patient denies anorexia, fever, chest pain, peripheral edema, abnormal bleeding, and enlarged lymph nodes.    Physical Exam  General:  Well-developed,well-nourished,in no acute distress; alert,appropriate and cooperative throughout examination Lungs:  normal respiratory effort and normal breath sounds.   Heart:  normal rate and regular rhythm.   Skin:  infected  lesion just below the knee right leg with erythema, swelling, central pointing with mild drainage.   Impression & Recommendations:  Problem # 1:  CELLULITIS AND ABSCESS OF LEG EXCEPT FOOT (ICD-682.6) recurrent infection presumed to be MRSA.  Plan - doxycycline 100mg  two times a day           decolonization: after finishing treatment for infection use mupirocin to each nares two times a day x 10 days; use hebiclens as bodywash once daily for 10 days.   His updated medication list for this problem includes:    Doxycycline Hyclate 100 Mg Tabs (Doxycycline hyclate) .Marland Kitchen... 1 by mouth two times a day x 10 days  Complete Medication List: 1)  Nexium 40 Mg Cpdr (Esomeprazole magnesium) .... Take 1 tablet by mouth every morning 2)  Adult Aspirin Ec Low Strength 81 Mg Tbec (Aspirin) .Marland Kitchen.. 1 by mouth once daily 3)  Crestor 5 Mg Tabs (Rosuvastatin calcium) .... Take 1 tablet by mouth once a day 4)  Celebrex 200 Mg Caps (Celecoxib) .Marland Kitchen.. 1 by mouth once daily 5)  Doxycycline Hyclate 100 Mg Tabs (Doxycycline hyclate) .Marland Kitchen.. 1 by mouth two times a day x 10 days 6)  Bactroban Nasal 2 % Oint (Mupirocin calcium) .... Apply with a q-tip to each nostril two times  a day for 10 days - decolonization mrsa  Patient Instructions: 1)  Recurrent cellulitis - most likely MRSA. Plan - doxycyline 100 mb two times a day x 10 days. Wash the wound two times a day with soap and water then dry.  2)  Pevention - an attempt at decolonization (literature states that the success rate is about 25% at 12 months): after leg infection clears use mupirocin ointment to each nostril two times a day for 10 days.  body wash with hebiclens daily for 10 days.  Prescriptions: BACTROBAN NASAL 2 % OINT (MUPIROCIN CALCIUM) apply with a q-tip to each nostril two times a day for 10 days - decolonization MRSA  #15g x 1   Entered and Authorized by:   Jacques Navy MD   Signed by:   Jacques Navy MD on 09/24/2010   Method used:   Electronically  to        [facility] # [phone]* (retail)       [address] [place], [place]  [address]       Ph: [phone]       Fax: [phone]   RxID:   [id number] DOXYCYCLINE HYCLATE 100 MG TABS (DOXYCYCLINE HYCLATE) 1 by mouth two times a day x 10 days  #20 x 1   Entered and Authorized by:   Jacques Navy MD   Signed by:   Jacques Navy MD on 09/24/2010   Method used:   Electronically to        [facility] # [phone]* (retail)       [address] [place], [place]  [address]       Ph: [phone]       Fax: [phone]   RxID:   [id number]    Orders Added: 1)  Est. Patient Level III [23557]

## 2010-10-14 ENCOUNTER — Ambulatory Visit (INDEPENDENT_AMBULATORY_CARE_PROVIDER_SITE_OTHER): Payer: BC Managed Care – PPO | Admitting: Internal Medicine

## 2010-10-14 ENCOUNTER — Encounter: Payer: Self-pay | Admitting: Internal Medicine

## 2010-10-14 DIAGNOSIS — L02429 Furuncle of limb, unspecified: Secondary | ICD-10-CM | POA: Insufficient documentation

## 2010-10-19 ENCOUNTER — Telehealth: Payer: Self-pay | Admitting: Internal Medicine

## 2010-10-21 NOTE — Assessment & Plan Note (Addendum)
Summary: STAFF INFECTION ON LEG IS WORSE /NWS   Vital Signs:  Patient profile:   65 year old male Height:      68 inches Weight:      220 pounds BMI:     33.57 O2 Sat:      96 % on Room air Temp:     97.5 degrees F oral Pulse rate:   65 / minute BP sitting:   142 / 84  (left arm) Cuff size:   regular  Vitals Entered By: Bill Salinas CMA (October 14, 2010 10:23 AM)  O2 Flow:  Room air CC: pt still has places on right leg, antibiotic did not help/ ab   Primary Care Provider:  Norins  CC:  pt still has places on right leg and antibiotic did not help/ ab.  History of Present Illness: Bryan Hays returns for recurrent follicular infection on the distal right lower extremity. He has had three episodes of infection which was initially identified as a MRSA infection at an Urgent Care. Each time he has had a good response to doxycycline. At his last visit for this problem he was instructed to use Hibiclens as a body wash and to use mupirocin nasal ointment for 10 days as prophylaxis. Within 10 days of completing his last round of doxycline he has a recurrent infection at two locations on the distal right LE. He has had no fevers, chills, or sweats. He has no underlying medical ailment that would render him immuno-incompetent.  Current Medications (verified): 1)  Nexium 40 Mg  Cpdr (Esomeprazole Magnesium) .... Take 1 Tablet By Mouth Every Morning 2)  Adult Aspirin Ec Low Strength 81 Mg Tbec (Aspirin) .Marland Kitchen.. 1 By Mouth Once Daily 3)  Crestor 5 Mg Tabs (Rosuvastatin Calcium) .... Take 1 Tablet By Mouth Once A Day 4)  Celebrex 200 Mg Caps (Celecoxib) .Marland Kitchen.. 1 By Mouth Once Daily 5)  Doxycycline Hyclate 100 Mg Tabs (Doxycycline Hyclate) .Marland Kitchen.. 1 By Mouth Two Times A Day X 10 Days 6)  Bactroban Nasal 2 % Oint (Mupirocin Calcium) .... Apply With A Q-Tip To Each Nostril Two Times A Day For 10 Days - Decolonization Mrsa  Allergies (verified): No Known Drug Allergies  Past History:  Past Medical  History: Last updated: 11/12/2007 DISC DISEASE, LUMBAR (ICD-722.52) NEPHROLITHIASIS, HX OF (ICD-V13.01) ALLERGIC RHINITIS, SEASONAL (ICD-477.0) HYPERLIPIDEMIA (ICD-272.4) Hx of TENDINITIS (ICD-726.90) GASTROESOPHAGEAL REFLUX DISEASE (ICD-530.81)    Past Surgical History: Last updated: 11/12/2007 TONSILLECTOMY, HX OF (ICD-V45.79) FOOT SURGERY, HX OF FOR PLANTAR FASCIITIS (ICD-V15.89) FH reviewed for relevance, SH/Risk Factors reviewed for relevance  Review of Systems Derm:  Complains of lesion(s); denies changes in color of skin, changes in nail beds, dryness, excessive perspiration, flushing, hair loss, insect bite(s), itching, poor wound healing, and rash.  Physical Exam  General:  Well-developed,well-nourished,in no acute distress; alert,appropriate and cooperative throughout examination Eyes:  C&S clear Nose:  no lesions in nares Lungs:  normal respiratory effort.   Heart:  normal rate and regular rhythm.   Neurologic:  alert & oriented X3 and gait normal.   Skin:  two small abscesses on the right leg: posterior calve and over the shin. There is a pointing with surrounding erythema. Purulent material is expressed from the wound on the calve and cultured.  Psych:  Oriented X3 and good eye contact.     Impression & Recommendations:  Problem # 1:  CARBUNCLE AND FURUNCLE OF LEG EXCEPT FOOT (ICD-680.6) Patient with 4th episode of small furuncle despite attempted prophylaxis  with hebilcens and nasal mupirocin.  Plan - wound culture and sensitivities           doxycylcine 100mg  two times a day x 10 days           adjust antibiotics as needed based on micro data           copy of this note to Dr. Orvan Falconer - re: any advice pertaining to diagnosis or treatment.  Orders: T-Culture, Wound ([id number])  Complete Medication List: 1)  Nexium 40 Mg Cpdr (Esomeprazole magnesium) .... Take 1 tablet by mouth every morning 2)  Adult Aspirin Ec Low Strength 81 Mg Tbec (Aspirin)  .Marland Kitchen.. 1 by mouth once daily 3)  Crestor 5 Mg Tabs (Rosuvastatin calcium) .... Take 1 tablet by mouth once a day 4)  Celebrex 200 Mg Caps (Celecoxib) .Marland Kitchen.. 1 by mouth once daily 5)  Doxycycline Hyclate 100 Mg Tabs (Doxycycline hyclate) .Marland Kitchen.. 1 by mouth two times a day x 10 days 6)  Bactroban Nasal 2 % Oint (Mupirocin calcium) .... Apply with a q-tip to each nostril two times a day for 10 days - decolonization mrsa Prescriptions: DOXYCYCLINE HYCLATE 100 MG TABS (DOXYCYCLINE HYCLATE) 1 by mouth two times a day x 10 days  #20 x 1   Entered and Authorized by:   Jacques Navy MD   Signed by:   Jacques Navy MD on 10/14/2010   Method used:   Electronically to        [facility] # [phone]* (retail)       [address] [place], [place]  [address]       Ph: [phone]       Fax: [phone]   RxID:   [id number] DOXYCYCLINE HYCLATE 100 MG TABS (DOXYCYCLINE HYCLATE) 1 by mouth two times a day x 10 days  #20 x 1   Entered and Authorized by:   Jacques Navy MD   Signed by:   Jacques Navy MD on 10/14/2010   Method used:   Electronically to        [facility]  [address] # [id number]* (retail)       [address]       [place], [place]  [address]       Ph: [phone] or [phone]       Fax: [phone]   RxID:   [id number]    Orders Added: 1)  T-Culture, Wound [[id number]] 2)  Est. Patient Level III [09323]

## 2010-10-30 NOTE — Progress Notes (Signed)
Summary: SULFA allergy?  Phone Note Outgoing Call   Reason for Call: Discuss lab or test results Summary of Call: Please call patient: wound culture positive for MRSA - not sensitve to doxycycline. Sensitve to trimethoprim/sulfamethsoxasol. Plan - tmp/smx DS two times a day for 14 days. I will let him know when I hear back from Dr. Orvan Falconer.  Thanks Initial call taken by: Jacques Navy MD,  October 19, 2010 11:38 PM  Follow-up for Phone Call        lmoam for pt to call back Follow-up by: Ami Bullins CMA,  October 20, 2010 11:37 AM  Additional Follow-up for Phone Call Additional follow up Details #1::        Spoke w/pt - says that first treatment for wound 9/11 was given rx for sulfur antibiotic and it caused nausea & vomiting. Would you like to change rx?  Additional Follow-up by: Lamar Sprinkles, CMA,  October 20, 2010 4:42 PM   New Allergies: ! SULFA Additional Follow-up for Phone Call Additional follow up Details #2::    ciprofloxacin 500mg  two times a day x 10 days, #20, 1 refill Follow-up by: Jacques Navy MD,  October 20, 2010 6:08 PM  Additional Follow-up for Phone Call Additional follow up Details #3:: Details for Additional Follow-up Action Taken: Pt informed  Additional Follow-up by: Lamar Sprinkles, CMA,  October 20, 2010 7:15 PM  New/Updated Medications: CIPRO 500 MG TABS (CIPROFLOXACIN HCL) two times a day x 10 days New Allergies: ! SULFAPrescriptions: CIPRO 500 MG TABS (CIPROFLOXACIN HCL) two times a day x 10 days  #20 x 1   Entered by:   Lamar Sprinkles, CMA   Authorized by:   Jacques Navy MD   Signed by:   Lamar Sprinkles, CMA on 10/20/2010   Method used:   Electronically to        [facility]  [address] # [id number]* (retail)       [address]       [place], [place]  [address]       Ph: [phone] or [phone]       Fax: [phone]   RxID:   [id number]

## 2011-01-05 ENCOUNTER — Other Ambulatory Visit: Payer: Self-pay | Admitting: Internal Medicine

## 2011-01-09 NOTE — Assessment & Plan Note (Signed)
[facility]                           PRIMARY CARE OFFICE NOTE   NAME:Dsouza, OLNEY MONIER                        MRN:          [id number]  DATE:11/25/2006                            DOB:          Oct 02, 1945    Mr. Bryan Hays is a 65 year old Caucasian gentleman who presents for routine  followup evaluation and exam.  He was last seen October 29, 2005.   INTERVAL HISTORY:  The patient has done, with no medical problems.  We  had discussed weight management at his last visit, but he has been  unsuccessful in loosing weight, in part because his wife is a great cook  and he is less physically active.   PAST MEDICAL HISTORY:  Well-documented in my note of September 15, 2004  and is without change or additions.  Family history and social history  are likewise well-documented.   INTERVAL SOCIAL HISTORY:  Significant for the fact that his stepdaughter  and granddaughter live with him in the house.  Otherwise, nothing has  changed.   REVIEW OF SYSTEMS:  The patient has had a 6 pound weight gain but no  other constitutional problems.  No ophthalmic, ENT, cardiovascular,  respiratory problems.  GI symptoms are very well-controlled with Nexium  q.a.m., and he has tried other products with less success.  The patient  has no GU complaints, except for a question of mildly decreased libido.  The patient does have occasional back pain, with a known history of a  herniated nucleus pulposus in 1992, but nothing that is disabling or  limiting him in his activities.   PHYSICAL EXAMINATION:  VITAL SIGNS:  Temperature 97, blood pressure  121/79, pulse 74.  Weight 225, up from 212.  GENERAL:  This is a heavy-set Caucasian male who looks his stated age,  in no acute distress.  HEENT:  Normocephalic, atraumatic.  EACs and TMs were normal.  Oropharynx with native dentition in good repair.  No buccal or palatal  lesions were noted.  The posterior pharynx was clear.  Conjunctivae and  sclerae were clear.  PERRLA, EOMI.  Funduscopic exam was unremarkable.  NECK:  Supple without thyromegaly.  No lymphadenopathy was noted in the  cervical or supraclavicular regions.  CHEST:  No CVA tenderness.  Lungs were clear to auscultation and  percussion.  CARDIOVASCULAR:  2+ radial pulses.  No JVD or carotid bruits.  He had a  quiet precordium with a regular rate and rhythm and without murmurs,  rubs, or gallops.  ABDOMEN:  Protuberant.  He had positive bowel sounds.  He had no  guarding or rebound.  No organosplenomegaly was appreciated.  GU:  Genitalia:  Normal male phallus, uncircumcised, bilaterally  descended testicles without masses.  RECTAL:  Normal sphincter tone was noted.  The prostate was smooth,  round, and normal in size and contour without nodules.  EXTREMITIES:  Without clubbing, cyanosis, edema, or deformity.  NEUROLOGIC:  Nonfocal.   LABORATORY DATA:  Hemoglobin 13.8, white count 6500 with a normal  differential.  Chemistries were normal with a serum glucose of 110.  Kidney function normal  with a creatinine of 0.8 and a GFR of 105  milliliters per minute.  Liver functions were normal, except for a  mildly elevated total bilirubin at 1.5.  Cholesterol 162, triglycerides  69, HDL 45.6, LDL 103.  Thyroid function normal with a TSH of 2.25.  PSA  was normal at 0.96.  Urinalysis was negative.   ASSESSMENT AND PLAN:  1. Gastroesophageal reflux disease.  The patient is well-controlled      with Nexium 40 mg every morning.  He will continue the same.  A      prescription was provided.  2. Lipids.  The patient has great control with simvastatin, with his      low-density lipoprotein definitely at goal of less than 130.  The      patient is to continue his present medications.  3. Health maintenance.  The patient would be a candidate for      colorectal cancer screening in 2010, having had a normal      colonoscopy in July of 2005 by Dr. Dorena Cookey.  He otherwise is       current and up to date with his health maintenance.  He did have a      12-lead electrocardiogram today which showed a normal sinus rhythm      and a normal electrocardiogram.  The patient is to loose weight.  I      have advised him to cut back on his calories and to shoot for      loosing 1 pound per month so that when he sees me in one year, he      will weigh 215 pounds or thereabouts.     Rosalyn Gess Norins, MD  Electronically Signed    MEN/MedQ  DD: 11/25/2006  DT: 11/25/2006  Job #: [id number]   cc:   Carnella Guadalajara

## 2011-01-09 NOTE — Discharge Summary (Signed)
[facility]  Patient:    Bryan Hays                         MRN: [id number] Adm. Date:  [id number] Disc. Date: 09/16/99 Attending:  Talitha Givens Dictator:   Lavella Hammock, P.A. CC:         Luis Abed, M.D. [facility]             Kathrine Cords, R.N.                           Discharge Summary  DATE OF BIRTH:  Oct 23, 1945  PROCEDURE:  Stress Cardiolite.  HOSPITAL COURSE:  [name] is a 65 year old male with no known coronary artery  disease who developed left arm pain and left chest pain that he had for about an hour prior to coming to the emergency room.  There was some shortness of breath  associated with this, but no nausea, vomiting, or diaphoresis.  He received nitroglycerin x 2 in the emergency room, and his blood pressure did drop with this, but the pain improved.  He was admitted for a further evaluation.  The enzymes ere negative for MI, and he was scheduled for a stress Cardiolite.  The stress Cardiolite was done on September 16, 1999, and he reached stage III, and his heart rate reached 160 with a goal of 142.  He was actually having 1/10 chest pain before the Cardiolite started, but the Cardiolite was done because his enzymes were negative, and the pain had been consistent.  The chest pain did not change at all with the exertion, and there were no EKG changes indicative of ischemia.  His total exercise time was 5 minutes and 35 seconds.  The Cardiolite images showed no scar, no ischemia, and an ejection fraction of 56%.  Because his pain had improved, and his enzymes were negative, and his Cardiolite was negative, he was considered suitable for discharge on September 16, 1999 p.m.  LABORATORY VALUES:  Hemoglobin 14.6, hematocrit 41.2, WBCs 12.4, and platelets f 310.  Sodium 138, potassium 3.5, chloride 108, CO2 28, BUN 16, creatinine 0.9, nd glucose 126.  Serial CK-MB was negative for MI.  Total cholesterol  232, triglycerides 148, HDL 39, and LDL 163.  CHEST X-RAY:  Vascular congestion without frank edema.  CONDITION ON DISCHARGE:  Improved.  CONSULTATIONS:  None.  COMPLICATIONS:  None.  DISCHARGE DIAGNOSES: 1. Chest pain, possibly musculoskeletal in origin.  The patient is to take the    Celebrex that he has at home, and we will assist with obtaining a primary    physician. 2. Hyperlipidemia.  The patient is to stick to a low-fat diet and follow up with    cardiology and primary care. 3. Borderline leukocytosis.  The patient is to report any fevers, cough, and other    symptoms of illness. 4. Gastroesophageal reflux disease symptoms.  The symptoms have been    well-controlled on Prilosec 20 q.d. prior to admission.  DISCHARGE INSTRUCTIONS: 1. The patients activity level is to be as tolerated.  He may return to work on  Thursday. 2. He is to stick to a low-fat and low-cholesterol diet.  He is to call Dr. Myrtis Ser    for an appointment tomorrow and see him in approximately 2-3 weeks.  DISCHARGE MEDICATIONS: 1. Prilosec 20 mg 1-2 x a day. 2. Celebrex p.r.n. suggested  q.d. DD:  09/16/99 TD:  09/16/99 Job: [id number]

## 2011-01-09 NOTE — Op Note (Signed)
NAMEJERAMEY, Bryan Hays                           ACCOUNT NO.:  [id number]   MEDICAL RECORD NO.:  [id number]                   PATIENT TYPE:  AMB   LOCATION:  ENDO                                 FACILITY:  [facility]   PHYSICIAN:  John C. Madilyn Fireman, M.D.                 DATE OF BIRTH:  07-Apr-1946   DATE OF PROCEDURE:  03/06/2004  DATE OF DISCHARGE:                                 OPERATIVE REPORT   INDICATIONS FOR PROCEDURE:  Average risk colon cancer screening in a 65-year-  old patient.   PROCEDURE:  The patient was placed in the left lateral decubitus position  and placed on pulse monitor with continuous low-flow oxygen delivered by  nasal cannula.  He was sedated with 25 mcg IV fentanyl and 2 mg IV Versed in  addition to medicine given for the previous EGD.  The Olympus video  colonoscope was inserted into the rectum and advanced to the cecum,  confirmed by transillumination of McBurney's point and visualization of the  ileocecal valve and appendiceal orifice.  The prep was excellent.  The  cecum, ascending, transverse, descending and sigmoid colon all appeared  normal with no masses, polyps, diverticula or other mucosal abnormalities.  The rectum likewise appeared normal.  On retroflexed view, the anus revealed  no obvious internal hemorrhoids.  The scope was then withdrawn and the  patient returned to the recovery room in stable condition.  He tolerated the  procedure well and there were no immediate complications.   IMPRESSION:  Normal colonoscopy.   PLAN:  Next colon screening by sigmoidoscopy in 5 years.                                               John C. Madilyn Fireman, M.D.    JCH/MEDQ  D:  03/06/2004  T:  03/06/2004  Job:  [id number]   cc:   Rosalyn Gess. Norins, M.D. [facility]

## 2011-01-09 NOTE — Op Note (Signed)
Bryan Hays, Bryan Hays                           ACCOUNT NO.:  [id number]   MEDICAL RECORD NO.:  [id number]                   PATIENT TYPE:  AMB   LOCATION:  ENDO                                 FACILITY:  [facility]   PHYSICIAN:  John C. Madilyn Fireman, M.D.                 DATE OF BIRTH:  Dec 29, 1945   DATE OF PROCEDURE:  03/06/2004  DATE OF DISCHARGE:                                 OPERATIVE REPORT   PROCEDURE:  Esophagogastroduodenoscopy.   INDICATION FOR PROCEDURE:  The patient undergoing screening colonoscopy.  He  has had chronic reflux symptoms.  Procedure is done as a one time screening  for Barrett's esophagus.   DESCRIPTION OF PROCEDURE:  The patient was placed in the left lateral  decubitus position and placed on the pulse monitor with continuous low-flow  oxygen delivered by nasal cannula.  He was sedated with 50 mcg IV fentanyl  and 6 mg IV Versed.  The Olympus video endoscope was advanced under direct  vision into the oropharynx and esophagus.  The esophagus was straight and of  normal caliber with the squamocolumnar line at 38 cm.  Above a small hiatal  hernia, there was no visible esophagitis, ring, stricture, or other  abnormality of the GE junction.  The stomach was entered, and a small amount  of liquid secretions were suctioned from the fundus.  Retroflexed view of  the cardia was unremarkable.  The fundus, body, antrum, and pylorus all  appeared normal.  The duodenum was entered, and both the bulb and second  portion were well-inspected and appeared to be within normal limits.  The  scope was then withdrawn, and the patient prepared for colonoscopy.  He  tolerated the procedure well, and there were no immediate complications.   IMPRESSION:  1. Small hiatal hernia.  2. Otherwise, normal study.   PLAN:  We will proceed with colonoscopy.                                               John C. Madilyn Fireman, M.D.    JCH/MEDQ  D:  03/06/2004  T:  03/06/2004  Job:  [id number]   cc:    Rosalyn Gess. Norins, M.D. [facility]

## 2011-02-13 ENCOUNTER — Other Ambulatory Visit (INDEPENDENT_AMBULATORY_CARE_PROVIDER_SITE_OTHER): Payer: BC Managed Care – PPO

## 2011-02-13 ENCOUNTER — Other Ambulatory Visit: Payer: Self-pay | Admitting: Internal Medicine

## 2011-02-13 DIAGNOSIS — Z0000 Encounter for general adult medical examination without abnormal findings: Secondary | ICD-10-CM

## 2011-02-13 DIAGNOSIS — Z0389 Encounter for observation for other suspected diseases and conditions ruled out: Secondary | ICD-10-CM

## 2011-02-13 LAB — BASIC METABOLIC PANEL
CO2: 28 mEq/L (ref 19–32)
Calcium: 8.9 mg/dL (ref 8.4–10.5)
Creatinine, Ser: 0.8 mg/dL (ref 0.4–1.5)
GFR: 111.34 mL/min (ref >60.00)
Glucose, Bld: 106 mg/dL — ABNORMAL HIGH (ref 70–99)

## 2011-02-13 LAB — CBC WITH DIFFERENTIAL/PLATELET
Basophils Relative: 0.4 % (ref 0.0–3.0)
Eosinophils Absolute: 0.7 10*3/uL (ref 0.0–0.7)
HCT: 42.2 % (ref 39.0–52.0)
Hemoglobin: 14.4 g/dL (ref 13.0–17.0)
Lymphs Abs: 2.5 10*3/uL (ref 0.7–4.0)
MCHC: 34.2 g/dL (ref 30.0–36.0)
MCV: 90.5 fl (ref 78.0–100.0)
Monocytes Absolute: 0.6 10*3/uL (ref 0.1–1.0)
Neutro Abs: 3.7 10*3/uL (ref 1.4–7.7)
RBC: 4.67 Mil/uL (ref 4.22–5.81)

## 2011-02-13 LAB — URINALYSIS
Leukocytes, UA: NEGATIVE
Nitrite: NEGATIVE
Specific Gravity, Urine: >=1.03 (ref 1.000–1.030)
Total Protein, Urine: NEGATIVE
pH: 5.5 (ref 5.0–8.0)

## 2011-02-13 LAB — LIPID PANEL
Total CHOL/HDL Ratio: 4
Triglycerides: 100 mg/dL (ref 0.0–149.0)

## 2011-02-13 LAB — HEPATIC FUNCTION PANEL
Alkaline Phosphatase: 78 U/L (ref 39–117)
Bilirubin, Direct: 0.2 mg/dL (ref 0.0–0.3)

## 2011-02-18 ENCOUNTER — Encounter: Payer: Self-pay | Admitting: Internal Medicine

## 2011-02-19 ENCOUNTER — Encounter: Payer: Self-pay | Admitting: Internal Medicine

## 2011-02-20 ENCOUNTER — Encounter: Payer: Self-pay | Admitting: Internal Medicine

## 2011-02-20 ENCOUNTER — Ambulatory Visit (INDEPENDENT_AMBULATORY_CARE_PROVIDER_SITE_OTHER): Payer: BC Managed Care – PPO | Admitting: Internal Medicine

## 2011-02-20 VITALS — BP 140/76 | HR 66 | Temp 97.5°F | Wt 213.0 lb

## 2011-02-20 DIAGNOSIS — Z136 Encounter for screening for cardiovascular disorders: Secondary | ICD-10-CM

## 2011-02-20 DIAGNOSIS — E785 Hyperlipidemia, unspecified: Secondary | ICD-10-CM

## 2011-02-20 DIAGNOSIS — Z0000 Encounter for general adult medical examination without abnormal findings: Secondary | ICD-10-CM

## 2011-02-20 DIAGNOSIS — N498 Inflammatory disorders of other specified male genital organs: Secondary | ICD-10-CM

## 2011-02-20 DIAGNOSIS — J301 Allergic rhinitis due to pollen: Secondary | ICD-10-CM

## 2011-02-20 DIAGNOSIS — N492 Inflammatory disorders of scrotum: Secondary | ICD-10-CM

## 2011-02-20 MED ORDER — ROSUVASTATIN CALCIUM 5 MG PO TABS: 5.0000 mg | ORAL_TABLET | Freq: Every day | ORAL | Status: DC

## 2011-02-20 MED ORDER — ESOMEPRAZOLE MAGNESIUM 40 MG PO CPDR: 40.0000 mg | DELAYED_RELEASE_CAPSULE | Freq: Every day | ORAL | Status: DC

## 2011-02-20 NOTE — Progress Notes (Signed)
Subjective:    Patient ID: Bryan Hays, male    DOB: March 20, 1946, 65 y.o.   MRN: [id number]  HPI Bryan Hays presents for routine medical follow-up. In the interval since his last visit he has been doing well with no major illness, he did have small retinal tear OS that was corrected with laser surgery (Dr. Allyne Gee), no injury. He did have a leg wound/infection that was persistent but did finally respond to cipro.   CC: he has a sore right shoulder. He was seen by Dr. Rennis Chris who performed intra-articular cortisone injection for bursitis that did not relieve pain. He cannot lay on the right side. 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   Past Medical History  Diagnosis Date  . Hyperlipidemia   . GERD (gastroesophageal reflux disease)   . Degeneration of lumbar or lumbosacral intervertebral disc   . Personal history of urinary calculi   . Allergic rhinitis due to pollen   . Tendinitis    Past Surgical History  Procedure Date  . Tonsillectomy   . Foot surgery     Hx plantar fasciitis   Family History  Problem Relation Age of Onset  . COPD Father   . Coronary artery disease Father   . Heart disease Father     chf  . Hypertension Father   . Diabetes Father   . Other Father     Cdiff  . Heart disease Mother     cardiovascular disease  . Alzheimer's disease Mother   . Diabetes Mother   . Cancer Neg Hx     colon or prostate   History   Social History  . Marital Status: Married    Spouse Name: N/A    Number of Children: 2  . Years of Education: 16   Occupational History  . 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 Smoking status: Never Smoker   . Smokeless tobacco: Never Used  . Alcohol Use: Yes     a occasional cold beer  . Drug Use: No  . Sexually Active: Yes -- Male partner(s)   Other Topics Concern  . Not on file   Social History Narrative   HSG. Bryan Hays less than a year.  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  Married '74--7 years divorced; married '84. 1 son -'8, daughter -'36; 2 grandsons, 3 step grandchildren. Retired from  [employer]. Enjoying his retirement. 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 End of Life care: provided packet.        Review of Systems Review of Systems  Constitutional:  Negative for fever, chills, activity change and unexpected weight change.  HEENT:  Negative for hearing loss, ear pain, congestion, neck stiffness and postnasal drip. Negative for sore throat or swallowing problems. Negative for dental complaints.   Eyes: Negative for vision loss or change in visual acuity.  Respiratory: Negative for chest tightness and wheezing.   Cardiovascular: Negative for chest pain and palpitation. No decreased exercise tolerance Gastrointestinal: No change in bowel habit. No bloating or gas. No reflux or indigestion Genitourinary: Negative for urgency, frequency, flank pain and difficulty urinating.  Musculoskeletal: Negative for myalgias, back pain, arthralgias and gait problem.  Neurological: Negative for dizziness, tremors, weakness and headaches.  Hematological: Negative for adenopathy.  Psychiatric/Behavioral: Negative for behavioral problems and dysphoric mood.  Objective:   Physical Exam Vital signs reviewed Gen'l: Well nourished well developed overweight white male in no acute distress  HENT:  Head: Normocephalic and atraumatic.  Right Ear: External ear normal. EAC/TM nl Left Ear: External ear normal.  EAC/TM nl Nose: Nose normal.  Mouth/Throat: Oropharynx is clear and moist. Dentition - native, in good repair. No buccal or palatal lesions. Posterior pharynx clear. Eyes: Conjunctivae and sclera clear. EOM intact. Pupils are equal, round, and reactive to light. Right eye exhibits no discharge. Left eye exhibits no discharge. Neck: Normal range of motion. Neck supple. No JVD present. No tracheal deviation present. No  thyromegaly present.  Cardiovascular: Normal rate, regular rhythm, no gallop, no friction rub, no murmur heard.      Quiet precordium. 2+ radial and DP pulses . No carotid bruits Pulmonary/Chest: Effort normal. No respiratory distress or increased WOB, no wheezes, no rales. No chest wall deformity or CVAT. Abdominal: Soft. Bowel sounds are normal in all quadrants. He exhibits no distension, no tenderness, no rebound or guarding, No heptosplenomegaly  Genitourinary: uncircumsized phallus, bilaterally descended testicles normal in size and texture. Small nodule above the testicle in left scrotum that is firm, mobile and not particularly tender.    Musculoskeletal: Decreased  range of motion right shoulder - limited abduction. Visible hollowing of upper head right biceps. He exhibits no edema and no tenderness.       Small and large joints without redness, synovial thickening or deformity. Full range of motion preserved about all small, median and large joints.  Lymphadenopathy:    He has no cervical or supraclavicular adenopathy.  Neurological: He is alert and oriented to person, place, and time. CN II-XII intact. DTRs 2+ and symmetrical biceps, radial and patellar tendons. Cerebellar function normal with no tremor, rigidity, normal gait and station.  Skin: Skin is warm and dry. No rash noted. No erythema.  Psychiatric: He has a normal mood and affect. His behavior is normal. Thought content normal.      Lab Results  Component Value Date   WBC 7.6 02/13/2011   HGB 14.4 02/13/2011   HCT 42.2 02/13/2011   PLT 226.0 02/13/2011   CHOL 182 02/13/2011   TRIG 100.0 02/13/2011   HDL 46.50 02/13/2011   ALT 21 02/13/2011   AST 23 02/13/2011   NA 141 02/13/2011   K 4.0 02/13/2011   CL 108 02/13/2011   CREATININE 0.8 02/13/2011   BUN 20 02/13/2011   CO2 28 02/13/2011   TSH 3.25 02/13/2011   PSA 1.38 02/13/2011   Lab Results  Component Value Date   LDLCALC 116* 02/13/2011       Assessment & Plan:  1.  Ortho - suspect patient has avusled the short head of the biceps and may have a posterior rotator cuff tear vs tear of scapularis vs rhomboid major on right.  Plan - return to Dr. Rennis Chris for re-evaluation.  2. Scrotal nodule - distinct nodule left scrotum. No weight loss, night sweats or other symptoms of malignancy  Plan - scrotal U/S

## 2011-02-21 DIAGNOSIS — Z0000 Encounter for general adult medical examination without abnormal findings: Secondary | ICD-10-CM | POA: Insufficient documentation

## 2011-02-21 NOTE — Assessment & Plan Note (Signed)
LDL 116 - below goal of 130, HDL greater than 39. He is tolerating crestor well.  Plan - continue present medication - Rx renewed

## 2011-02-21 NOTE — Assessment & Plan Note (Signed)
Symptoms are extending beyond spring and fall to now early summer.  Plan - trial of nasal steroid spray.

## 2011-02-21 NOTE — Assessment & Plan Note (Signed)
Interval history remarkable for ortho injury otherwise normal. Physical exam - abnormal for biceps injury and scrotal nodule. Lab results are in normal range. Colorectal cancer screening is current with last study July '05. Immunizations: Tdap June '11; Shingles June '11. 12 lead EKG without signs of ischemia or injury.   In summary - a very nice man who is generally doing well medically. He is advised to return to Dr. Rennis Chris for continued evaluation of his shoulder and biceps. He will be schedule for scrotal U/S as noted. He is encouraged to continue with a physical exercise program. He is counseled to loose weight: smart food choices, PORTION SIZE CONTROL, and exercise with a target weight of 190 lbw (BMI 25) down from 213. Goal is to loose 1 lb per month. He will return as needed or in 1 year.

## 2011-02-24 ENCOUNTER — Ambulatory Visit (HOSPITAL_COMMUNITY)
Admission: RE | Admit: 2011-02-24 | Discharge: 2011-02-24 | Disposition: A | Discharge status: Home / Self Care | Payer: BC Managed Care – PPO | Source: Ambulatory Visit | Attending: Internal Medicine | Admitting: Internal Medicine

## 2011-02-24 DIAGNOSIS — N492 Inflammatory disorders of scrotum: Secondary | ICD-10-CM

## 2011-02-24 DIAGNOSIS — I861 Scrotal varices: Secondary | ICD-10-CM | POA: Insufficient documentation

## 2011-02-24 DIAGNOSIS — N508 Other specified disorders of male genital organs: Secondary | ICD-10-CM | POA: Insufficient documentation

## 2011-02-26 ENCOUNTER — Encounter: Payer: Self-pay | Admitting: Internal Medicine

## 2011-03-03 ENCOUNTER — Telehealth: Payer: Self-pay | Admitting: *Deleted

## 2011-03-03 NOTE — Telephone Encounter (Signed)
Patient requesting result of u/s. Letter was mailed, left pt detailed vm per his request.

## 2011-03-12 ENCOUNTER — Other Ambulatory Visit: Payer: Self-pay | Admitting: Internal Medicine

## 2012-01-04 ENCOUNTER — Other Ambulatory Visit: Payer: Self-pay | Admitting: Internal Medicine

## 2012-03-09 ENCOUNTER — Encounter: Payer: Self-pay | Admitting: Internal Medicine

## 2012-03-09 ENCOUNTER — Ambulatory Visit (INDEPENDENT_AMBULATORY_CARE_PROVIDER_SITE_OTHER): Payer: Medicare Other | Admitting: Internal Medicine

## 2012-03-09 ENCOUNTER — Other Ambulatory Visit (INDEPENDENT_AMBULATORY_CARE_PROVIDER_SITE_OTHER): Payer: Medicare Other

## 2012-03-09 VITALS — BP 122/72 | HR 62 | Temp 97.8°F | Resp 16 | Wt 212.0 lb

## 2012-03-09 DIAGNOSIS — K219 Gastro-esophageal reflux disease without esophagitis: Secondary | ICD-10-CM

## 2012-03-09 DIAGNOSIS — Z136 Encounter for screening for cardiovascular disorders: Secondary | ICD-10-CM

## 2012-03-09 DIAGNOSIS — E785 Hyperlipidemia, unspecified: Secondary | ICD-10-CM

## 2012-03-09 DIAGNOSIS — Z0000 Encounter for general adult medical examination without abnormal findings: Secondary | ICD-10-CM

## 2012-03-09 DIAGNOSIS — Z23 Encounter for immunization: Secondary | ICD-10-CM | POA: Insufficient documentation

## 2012-03-09 LAB — COMPREHENSIVE METABOLIC PANEL
Albumin: 3.7 g/dL (ref 3.5–5.2)
Alkaline Phosphatase: 76 U/L (ref 39–117)
Calcium: 9 mg/dL (ref 8.4–10.5)
Chloride: 105 mEq/L (ref 96–112)
Glucose, Bld: 116 mg/dL — ABNORMAL HIGH (ref 70–99)
Potassium: 4.1 mEq/L (ref 3.5–5.1)
Sodium: 140 mEq/L (ref 135–145)
Total Protein: 7.1 g/dL (ref 6.0–8.3)

## 2012-03-09 LAB — HEPATIC FUNCTION PANEL
ALT: 19 U/L (ref 0–53)
Total Bilirubin: 1.1 mg/dL (ref 0.3–1.2)
Total Protein: 7.1 g/dL (ref 6.0–8.3)

## 2012-03-09 LAB — LIPID PANEL
Cholesterol: 170 mg/dL (ref 0–200)
VLDL: 16.8 mg/dL (ref 0.0–40.0)

## 2012-03-09 NOTE — Progress Notes (Signed)
Subjective:    Patient ID: Bryan Hays, male    DOB: Aug 18, 1946, 66 y.o.   MRN: [id number]  HPI The patient is here for annual Medicare wellness examination and management of other chronic and acute problems.  Interval: had complex shoulder repair right - could not play golf for 3-4 months.    The risk factors are reflected in the social history.  The roster of all physicians providing medical care to patient - is listed in the Snapshot section of the chart.  Activities of daily living:  The patient is 100% inedpendent in all ADLs: dressing, toileting, feeding as well as independent mobility  Home safety : The patient has smoke detectors in the home. Fall- fall safe environment. They wear seatbelts.  firearms are present in the home, kept in a safe fashion. There is no violence in the home.   There is no risks for hepatitis, STDs or HIV. There is no   history of blood transfusion. They have no travel history to infectious disease endemic areas of the world.  The patient has seen their dentist in the last six month. They have seen their eye doctor in the last year. They deny any hearing difficulty and have not had audiologic testing in the last year.  They do not  have excessive sun exposure. Discussed the need for sun protection: hats, long sleeves and use of sunscreen if there is significant sun exposure.   Diet: the importance of a healthy diet is discussed. They do have a healthy diet.  The patient has a regular exercise program: golf/walking , 60 min duration, 4 per week.  The benefits of regular aerobic exercise were discussed.  Depression screen: there are no signs or vegative symptoms of depression- irritability, change in appetite, anhedonia, sadness/tearfullness.  Cognitive assessment: the patient manages all their financial and personal affairs and is actively engaged.   The following portions of the patient's history were reviewed and updated as appropriate: allergies,  current medications, past family history, past medical history,  past surgical history, past social history  and problem list.  Vision, hearing, body mass index were assessed and reviewed.   During the course of the visit the patient was educated and counseled about appropriate screening and preventive services including : fall prevention , diabetes screening, nutrition counseling, colorectal cancer screening, and recommended immunizations.  Past Medical History  Diagnosis Date  . Hyperlipidemia   . GERD (gastroesophageal reflux disease)   . Degeneration of lumbar or lumbosacral intervertebral disc   . Personal history of urinary calculi   . Allergic rhinitis due to pollen   . Tendinitis    Past Surgical History  Procedure Date  . Tonsillectomy   . Foot surgery     Hx plantar fasciitis   Family History  Problem Relation Age of Onset  . COPD Father   . Coronary artery disease Father   . Heart disease Father     chf  . Hypertension Father   . Diabetes Father   . Other Father     Cdiff  . Heart disease Mother     cardiovascular disease  . Alzheimer's disease Mother   . Diabetes Mother   . Cancer Neg Hx     colon or prostate   History   Social History  . Marital Status: Married    Spouse Name: N/A    Number of Children: 2  . Years of Education: 16   Occupational History  . 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 Smoking status: Never Smoker   . Smokeless tobacco: Never Used  . Alcohol Use: Yes     a occasional cold beer  . Drug Use: No  . Sexually Active: Yes -- Male partner(s)   Other Topics Concern  . Not on file   Social History Narrative   HSG. [place] less than a year. 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  Married '74--7 years divorced; married '84. 1 son -'3, daughter -'7; 2 grandsons, 3 step grandchildren. Retired from  [employer]. Enjoying his retirement. 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 End of  Life care: provided packet.        Review of Systems Constitutional:  Negative for fever, chills, activity change and unexpected weight change.  HEENT:  Negative for hearing loss, ear pain, congestion, neck stiffness and postnasal drip. Negative for sore throat or swallowing problems. Negative for dental complaints.   Eyes: Negative for vision loss or change in visual acuity. Did have retinal tear - laser repair (Dr. Grayling Congress) Respiratory: Negative for chest tightness and wheezing. Negative for DOE.   Cardiovascular: Negative for chest pain or palpitations. No decreased exercise tolerance Gastrointestinal: No change in bowel habit. No bloating or gas. No reflux or indigestion Genitourinary: Negative for urgency, frequency, flank pain and difficulty urinating.  Musculoskeletal: Negative for myalgias, back pain, arthralgias and gait problem.  Neurological: Negative for dizziness, tremors, weakness and headaches.  Hematological: Negative for adenopathy.  Psychiatric/Behavioral: Negative for behavioral problems and dysphoric mood.       Objective:   Physical Exam Filed Vitals:   03/09/12 0859  BP: 122/72  Pulse: 62  Temp: 97.8 F (36.6 C)  Resp: 16   Wt Readings from Last 3 Encounters:  03/09/12 212 lb (96.163 kg)  02/20/11 213 lb (96.616 kg)  10/14/10 220 lb (99.791 kg)    Gen'l: Well nourished well developed white male in no acute distress  HEENT: Head: Normocephalic and atraumatic. Right Ear: External ear normal. EAC/TM nl. Left Ear: External ear normal.  EAC/TM nl. Nose: Nose normal. Mouth/Throat: Oropharynx is clear and moist. Dentition - native, in good repair. No buccal or palatal lesions. Posterior pharynx clear. Eyes: Conjunctivae and sclera clear. EOM intact. Pupils are equal, round, and reactive to light. Right eye exhibits no discharge. Left eye exhibits no discharge. Neck: Normal range of motion. Neck supple. No JVD present. No tracheal deviation present. No thyromegaly  present.  Cardiovascular: Normal rate, regular rhythm, no gallop, no friction rub, no murmur heard.      Quiet precordium. 2+ radial and DP pulses . No carotid bruits Pulmonary/Chest: Effort normal. No respiratory distress or increased WOB, no wheezes, no rales. No chest wall deformity or CVAT. Abdominal: Soft. Bowel sounds are normal in all quadrants. He exhibits no distension, no tenderness, no rebound or guarding, No heptosplenomegaly  Genitourinary:  deferred Musculoskeletal: Normal range of motion. He exhibits no edema and no tenderness.       Small and large joints without redness, synovial thickening or deformity. Full range of motion preserved about all small, median and large joints.  Lymphadenopathy:    He has no cervical or supraclavicular adenopathy.  Neurological: He is alert and oriented to person, place, and time. CN II-XII intact. DTRs 2+ and symmetrical biceps, radial and patellar tendons. Cerebellar function normal with no tremor, rigidity, normal gait and station.  Skin: Skin is warm and  dry. No rash noted. No erythema.  Psychiatric: He has a normal mood and affect. His behavior is normal. Thought content normal.   Lab Results  Component Value Date   WBC 7.6 02/13/2011   HGB 14.4 02/13/2011   HCT 42.2 02/13/2011   PLT 226.0 02/13/2011   GLUCOSE 116* 03/09/2012   CHOL 170 03/09/2012   TRIG 84.0 03/09/2012   HDL 52.70 03/09/2012   LDLCALC 101* 03/09/2012        ALT 19 03/09/2012   AST 25 03/09/2012        NA 140 03/09/2012   K 4.1 03/09/2012   CL 105 03/09/2012   CREATININE 0.8 03/09/2012   BUN 21 03/09/2012   CO2 29 03/09/2012   TSH 3.25 02/13/2011   PSA 1.38 02/13/2011         Assessment & Plan:

## 2012-03-12 NOTE — Assessment & Plan Note (Signed)
Sable with no complaints of discomfort, dysphagia or reflux.

## 2012-03-12 NOTE — Assessment & Plan Note (Signed)
INterval medical history significant for complex shoulder repair. Physical exam normal but he is carrying a little weight. Lab results are in normal limits. He is current with colorectal cancer screening. Discussed pros and cons of prostate cancer screening (USPHCTF recommendations reviewed and ACU April '13 recommendations) and he defers evaluation at this time with normal PSA readings '08 thru '12. Immunizations are up to date. 12 lead EKG with minor sinus bradycardia, no evidence of old injury or ischemia.  In summary - a very nice man who appears medically stable. He is enjoying retirement. It is recommended that he return to a full exercise program as soon as possible and this will lead to some modest weight loss. He will return as needed or in 1 year.

## 2012-03-12 NOTE — Assessment & Plan Note (Signed)
Good control: LDL 101 - close to goal of 100 or less; HDL 53 better than goal. Liver functions normal  Plan  Continue crestor at present dose.

## 2012-03-14 ENCOUNTER — Encounter: Payer: Self-pay | Admitting: Internal Medicine

## 2012-04-08 ENCOUNTER — Other Ambulatory Visit: Payer: Self-pay | Admitting: Internal Medicine

## 2012-04-27 ENCOUNTER — Other Ambulatory Visit: Payer: Self-pay | Admitting: Internal Medicine

## 2012-10-08 ENCOUNTER — Other Ambulatory Visit: Payer: Self-pay | Admitting: Internal Medicine

## 2012-10-10 ENCOUNTER — Other Ambulatory Visit: Payer: Self-pay | Admitting: *Deleted

## 2012-10-10 MED ORDER — CELECOXIB 200 MG PO CAPS: 200.0000 mg | ORAL_CAPSULE | Freq: Every day | ORAL | Status: DC

## 2013-03-16 ENCOUNTER — Other Ambulatory Visit (INDEPENDENT_AMBULATORY_CARE_PROVIDER_SITE_OTHER): Payer: Medicare Other

## 2013-03-16 ENCOUNTER — Encounter: Payer: Self-pay | Admitting: Internal Medicine

## 2013-03-16 ENCOUNTER — Ambulatory Visit (INDEPENDENT_AMBULATORY_CARE_PROVIDER_SITE_OTHER): Payer: Medicare Other | Admitting: Internal Medicine

## 2013-03-16 VITALS — BP 112/74 | HR 60 | Temp 97.8°F | Resp 12 | Ht 68.0 in | Wt 216.0 lb

## 2013-03-16 DIAGNOSIS — Z0000 Encounter for general adult medical examination without abnormal findings: Secondary | ICD-10-CM

## 2013-03-16 DIAGNOSIS — Z125 Encounter for screening for malignant neoplasm of prostate: Secondary | ICD-10-CM

## 2013-03-16 DIAGNOSIS — T887XXA Unspecified adverse effect of drug or medicament, initial encounter: Secondary | ICD-10-CM

## 2013-03-16 DIAGNOSIS — E785 Hyperlipidemia, unspecified: Secondary | ICD-10-CM

## 2013-03-16 DIAGNOSIS — K219 Gastro-esophageal reflux disease without esophagitis: Secondary | ICD-10-CM

## 2013-03-16 LAB — LIPID PANEL
Cholesterol: 167 mg/dL (ref 0–200)
LDL Cholesterol: 103 mg/dL — ABNORMAL HIGH (ref 0–99)
Triglycerides: 85 mg/dL (ref 0.0–149.0)

## 2013-03-16 LAB — COMPREHENSIVE METABOLIC PANEL
Albumin: 3.8 g/dL (ref 3.5–5.2)
Alkaline Phosphatase: 81 U/L (ref 39–117)
BUN: 18 mg/dL (ref 6–23)
CO2: 29 mEq/L (ref 19–32)
GFR: 102.68 mL/min (ref >60.00)
Glucose, Bld: 104 mg/dL — ABNORMAL HIGH (ref 70–99)
Potassium: 4.2 mEq/L (ref 3.5–5.1)
Total Bilirubin: 1.4 mg/dL — ABNORMAL HIGH (ref 0.3–1.2)

## 2013-03-16 LAB — HEPATIC FUNCTION PANEL
ALT: 22 U/L (ref 0–53)
AST: 26 U/L (ref 0–37)
Bilirubin, Direct: 0.2 mg/dL (ref 0.0–0.3)
Total Bilirubin: 1.4 mg/dL — ABNORMAL HIGH (ref 0.3–1.2)

## 2013-03-16 LAB — HEMOGLOBIN AND HEMATOCRIT, BLOOD: HCT: 43.2 % (ref 39.0–52.0)

## 2013-03-16 NOTE — Progress Notes (Signed)
Subjective:    Patient ID: DEJION GRILLO, male    DOB: 07-25-1946, 67 y.o.   MRN: [id number]  HPI The patient is here for annual Medicare wellness examination and management of other chronic and acute problems.  He has been taking Celebrex daily for a year and has concerns about side affects.  The risk factors are reflected in the social history.  The roster of all physicians providing medical care to patient - is listed in the Snapshot section of the chart.  Activities of daily living:  The patient is 100% inedpendent in all ADLs: dressing, toileting, feeding as well as independent mobility  Home safety : The patient has smoke detectors in the home. Falls - no. They wear seatbelts. No firearms at home. There is no violence in the home.   There is no risks for hepatitis, STDs or HIV. There is no history of blood transfusion. They have no travel history to infectious disease endemic areas of the world.  The patient has seen their dentist in the last six month. They have seen their eye doctor in the last year. They deny   any hearing difficulty and have not had audiologic testing in the last year.    They do not  have excessive sun exposure. Discussed the need for sun protection: hats, long sleeves and use of sunscreen if there is significant sun exposure.   Diet: the importance of a healthy diet is discussed. They do have a healthy diet.  The patient has a regular exercise program: golf , 4 hr duration, 3 - per week.  The benefits of regular aerobic exercise were discussed.  Depression screen: there are no signs or vegative symptoms of depression- irritability, change in appetite, anhedonia, sadness/tearfullness.  Cognitive assessment: the patient manages all their financial and personal affairs and is actively engaged. They could relate day,date,year and events; recalled 3/3 objects at 3 minutes; performed clock-face test normally.  The following portions of the patient's history were  reviewed and updated as appropriate: allergies, current medications, past family history, past medical history,  past surgical history, past social history  and problem list.  Vision, hearing, body mass index were assessed and reviewed.   During the course of the visit the patient was educated and counseled about appropriate screening and preventive services including : fall prevention , diabetes screening, nutrition counseling, colorectal cancer screening, and recommended immunizations.  Past Medical History  Diagnosis Date  . Hyperlipidemia   . GERD (gastroesophageal reflux disease)   . Degeneration of lumbar or lumbosacral intervertebral disc   . Personal history of urinary calculi   . Allergic rhinitis due to pollen   . Tendinitis    Past Surgical History  Procedure Laterality Date  . Tonsillectomy    . Foot surgery      Hx plantar fasciitis  . Shoulder arthroscopy  2012    right shoulder    Family History  Problem Relation Age of Onset  . COPD Father   . Coronary artery disease Father   . Heart disease Father     chf  . Hypertension Father   . Diabetes Father   . Other Father     Cdiff  . Heart disease Mother     cardiovascular disease  . Alzheimer's disease Mother   . Diabetes Mother   . Cancer Neg Hx     colon or prostate   History   Social History  . Marital Status: Married    Spouse Name: N/A  Number of Children: 2  . Years of Education: 16   Occupational History  . 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 Smoking status: Never Smoker   . Smokeless tobacco: Never Used  . Alcohol Use: Yes     Comment: a occasional cold beer  . Drug Use: No  . Sexually Active: Yes -- Male partner(s)   Other Topics Concern  . Not on file   Social History Narrative   HSG. [employer] less than a year. 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  Married '74--7 years divorced; married '84. 1 son -'71, daughter -'2; 2 grandsons, 3  step grandchildren. Retired from  [employer]. Enjoying his retirement. 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 End of Life care: provided packet.     Current Outpatient Prescriptions on File Prior to Visit  Medication Sig Dispense Refill  . aspirin 81 MG EC tablet Take 81 mg by mouth daily.        . celecoxib (CELEBREX) 200 MG capsule Take 1 capsule (200 mg total) by mouth daily.  90 capsule  2  . CRESTOR 5 MG tablet TAKE 1 TABLET BY MOUTH EVERY DAY  90 tablet  3  . NEXIUM 40 MG capsule TAKE ONE CAPSULE BY MOUTH EVERY DAY BEFORE BREAKFAST  90 capsule  3   No current facility-administered medications on file prior to visit.      Review of Systems Constitutional:  Negative for fever, chills, activity change and unexpected weight change.  HEENT:  Negative for hearing loss, ear pain, congestion, neck stiffness and postnasal drip. Negative for sore throat or swallowing problems. Negative for dental complaints.   Eyes: Negative for vision loss or change in visual acuity.  Respiratory: Negative for chest tightness and wheezing. Negative for DOE.   Cardiovascular: Negative for chest pain or palpitations. No decreased exercise tolerance Gastrointestinal: No change in bowel habit. No bloating or gas. No reflux or indigestion Genitourinary: Negative for urgency, frequency, flank pain and difficulty urinating.  Musculoskeletal: Negative for myalgias, back pain, arthralgias and gait problem.  Neurological: Negative for dizziness, tremors, weakness and headaches.  Hematological: Negative for adenopathy.  Psychiatric/Behavioral: Negative for behavioral problems and dysphoric mood.       Objective:   Physical Exam Filed Vitals:   03/16/13 1000  BP: 112/74  Pulse: 60  Temp: 97.8 F (36.6 C)  Resp: 12   Wt Readings from Last 3 Encounters:  03/16/13 216 lb (97.977 kg)  03/09/12 212 lb (96.163 kg)  02/20/11 213 lb (96.616 kg)   Gen'l: Well nourished well developed white male in no acute  distress  HEENT: Head: Normocephalic and atraumatic. Right Ear: External ear normal. EAC/TM nl. Left Ear: External ear normal.  EAC/TM nl. Nose: Nose normal. Mouth/Throat: Oropharynx is clear and moist. Dentition - native, in good repair. No buccal or palatal lesions. Posterior pharynx clear. Eyes: Conjunctivae and sclera clear. EOM intact. Pupils are equal, round, and reactive to light. Right eye exhibits no discharge. Left eye exhibits no discharge. Neck: Normal range of motion. Neck supple. No JVD present. No tracheal deviation present. No thyromegaly present.  Cardiovascular: Normal rate, regular rhythm, no gallop, no friction rub, no murmur heard.      Quiet precordium. 2+ radial and DP pulses . No carotid bruits Pulmonary/Chest: Effort normal. No respiratory distress or increased WOB, no wheezes, no rales. No chest wall deformity or CVAT. Abdomen:  Soft. Bowel sounds are normal in all quadrants. He exhibits no distension, no tenderness, no rebound or guarding, No heptosplenomegaly  Genitourinary:  deferred Musculoskeletal: Normal range of motion. He exhibits no edema and no tenderness.       Small and large joints without redness, synovial thickening or deformity. Full range of motion preserved about all small, median and large joints.  Lymphadenopathy:    He has no cervical or supraclavicular adenopathy.  Neurological: He is alert and oriented to person, place, and time. CN II-XII intact. DTRs 2+ and symmetrical biceps, radial and patellar tendons. Cerebellar function normal with no tremor, rigidity, normal gait and station.  Skin: Skin is warm and dry. No rash noted. No erythema.  Psychiatric: He has a normal mood and affect. His behavior is normal. Thought content normal.   Recent Results (from the past 2160 hour(s))  HEPATIC FUNCTION PANEL     Status: Abnormal   Collection Time    03/16/13 11:12 AM      Result Value Range   Total Bilirubin 1.4 (*) 0.3 - 1.2 mg/dL   Bilirubin, Direct  0.2  0.0 - 0.3 mg/dL   Alkaline Phosphatase 81  39 - 117 U/L   AST 26  0 - 37 U/L   ALT 22  0 - 53 U/L   Total Protein 6.8  6.0 - 8.3 g/dL   Albumin 3.8  3.5 - 5.2 g/dL  COMPREHENSIVE METABOLIC PANEL     Status: Abnormal   Collection Time    03/16/13 11:12 AM      Result Value Range   Sodium 140  135 - 145 mEq/L   Potassium 4.2  3.5 - 5.1 mEq/L   Chloride 106  96 - 112 mEq/L   CO2 29  19 - 32 mEq/L   Glucose, Bld 104 (*) 70 - 99 mg/dL   BUN 18  6 - 23 mg/dL   Creatinine, Ser 0.8  0.4 - 1.5 mg/dL   Total Bilirubin 1.4 (*) 0.3 - 1.2 mg/dL   Alkaline Phosphatase 81  39 - 117 U/L   AST 26  0 - 37 U/L   ALT 22  0 - 53 U/L   Total Protein 6.8  6.0 - 8.3 g/dL   Albumin 3.8  3.5 - 5.2 g/dL   Calcium 9.4  8.4 - 16.1 mg/dL   GFR 096.04  >54.09 mL/min  LIPID PANEL     Status: Abnormal   Collection Time    03/16/13 11:12 AM      Result Value Range   Cholesterol 167  0 - 200 mg/dL   Comment: ATP III Classification       Desirable:  < 200 mg/dL               Borderline High:  200 - 239 mg/dL          High:  > = 811 mg/dL   Triglycerides 91.4  0.0 - 149.0 mg/dL   Comment: Normal:  <782 mg/dLBorderline High:  150 - 199 mg/dL   HDL 95.62  >13.08 mg/dL   VLDL 65.7  0.0 - 84.6 mg/dL   LDL Cholesterol 962 (*) 0 - 99 mg/dL   Total CHOL/HDL Ratio 4     Comment:                Men          Women1/2 Average Risk     3.4  3.3Average Risk          5.0          4.42X Average Risk          9.6          7.13X Average Risk          15.0          11.0                      PSA     Status: None   Collection Time    03/16/13 11:12 AM      Result Value Range   PSA 1.14  0.10 - 4.00 ng/mL  HEMOGLOBIN AND HEMATOCRIT, BLOOD     Status: None   Collection Time    03/16/13 11:12 AM      Result Value Range   Hemoglobin 14.5  13.0 - 17.0 g/dL   HCT 40.9  81.1 - 91.4 %         Assessment & Plan:

## 2013-03-16 NOTE — Patient Instructions (Addendum)
Thanks for coming in.  Physical exam is good except for being somewhat overweight. Please work on this: Diet management: smart food choices, PORTION SIZE CONTROL, regular exercise. Goal - to loose 1-2 lbs.month. Target weight - 190.  Labs today - results will be available on MyChart - please sign up.   Health maintenance is up to date - colonoscopy will be due in 2015.   See you next year.

## 2013-03-18 ENCOUNTER — Encounter: Payer: Self-pay | Admitting: Internal Medicine

## 2013-03-19 NOTE — Assessment & Plan Note (Signed)
Taking and tolerating medication. LDL is close to goal of 100 or less. HDL is ok. Liver functions ok  Plan  Continue present med and dose.

## 2013-03-19 NOTE — Assessment & Plan Note (Signed)
Interval history is benign. Physical exam is normal. Lab results in normal limits. He is current with colorectal cancer screening and prostate cancer screening with normal PSA. Immunizations are up to date.  In summary - a nice man who is medically stable. He will continue his present regimen and return in 1 year or sooner as needed.

## 2013-04-13 ENCOUNTER — Other Ambulatory Visit: Payer: Self-pay | Admitting: Internal Medicine

## 2013-07-11 ENCOUNTER — Other Ambulatory Visit: Payer: Self-pay | Admitting: Internal Medicine

## 2013-12-04 ENCOUNTER — Ambulatory Visit (INDEPENDENT_AMBULATORY_CARE_PROVIDER_SITE_OTHER): Payer: Medicare Other

## 2013-12-04 VITALS — BP 150/89 | HR 73 | Resp 18

## 2013-12-04 DIAGNOSIS — M79609 Pain in unspecified limb: Secondary | ICD-10-CM

## 2013-12-04 DIAGNOSIS — M722 Plantar fascial fibromatosis: Secondary | ICD-10-CM

## 2013-12-04 DIAGNOSIS — M7730 Calcaneal spur, unspecified foot: Secondary | ICD-10-CM

## 2013-12-04 DIAGNOSIS — M79672 Pain in left foot: Secondary | ICD-10-CM

## 2013-12-04 DIAGNOSIS — M79671 Pain in right foot: Secondary | ICD-10-CM

## 2013-12-04 MED ORDER — TRIAMCINOLONE ACETONIDE 10 MG/ML IJ SUSP: 10.0000 mg | Freq: Once | INTRAMUSCULAR | Status: DC

## 2013-12-04 NOTE — Progress Notes (Signed)
   Subjective:    Patient ID: Bryan Hays, male    DOB: [birth year], 68 y.o.   MRN: [id number]  HPI my right heel and has been going on for about 2 weeks and hurts on the bottom and I did see Dr. Janus Molder a long time and he did injections and inserts and surgery on it and it was doing good but it feels like it has come back and hurts when I get up    Review of Systems  HENT: Positive for sinus pressure.   All other systems reviewed and are negative.      Objective:   Physical Exam March the objective findings as follows vascular status is intact pedal pulses palpable DP and PT posterior were for bilateral capillary refill time 3 seconds all digits skin temperature warm turgor normal no edema rubor pallor or varicosities noted. Neurologically epicritic and proprioceptive sensations intact and symmetric bilateral is normal plantar response and DTRs noted. Dermatologically skin color pigment normal hair growth absent. Nails somewhat criptotic orthopedic exam there is pain on palpation Magan plantar fascia medial calcaneal tubercle on the right heel. There is well-developed inferior calculus for on the radial. Historically patient did have surgery EPF procedure on the right heel back in 2001 or 2000 and to have in orthotics previously however stop wearing them sometime ago. Patient otherwise been active doing other things over the last 2 weeks has had pain for step in the morning or getting up appear rest he went addressed early before Severe like it did previously.       Assessment & Plan:  Assessment recalcitrant recurrent plantar fasciitis/heel spur syndrome right heel mid band of the plantar fascia was affected this time injection tender with Kenalog 20 with a gram Xylocaine plain infiltrated to the right heel fascial strapping is applied recheck in 2 weeks for possible orthotic scan and reevaluation at consider additional injections patient currently taking Celebrex will continue to do so as  instructed also recommended ice every evening.  Harriet Masson DPM

## 2013-12-04 NOTE — Patient Instructions (Signed)

## 2013-12-22 ENCOUNTER — Ambulatory Visit (INDEPENDENT_AMBULATORY_CARE_PROVIDER_SITE_OTHER): Payer: Medicare Other

## 2013-12-22 VITALS — BP 142/75 | HR 68 | Resp 16

## 2013-12-22 DIAGNOSIS — M7730 Calcaneal spur, unspecified foot: Secondary | ICD-10-CM

## 2013-12-22 DIAGNOSIS — M79671 Pain in right foot: Secondary | ICD-10-CM

## 2013-12-22 DIAGNOSIS — M722 Plantar fascial fibromatosis: Secondary | ICD-10-CM

## 2013-12-22 DIAGNOSIS — M79609 Pain in unspecified limb: Secondary | ICD-10-CM

## 2013-12-22 DIAGNOSIS — M79672 Pain in left foot: Secondary | ICD-10-CM

## 2013-12-22 NOTE — Progress Notes (Signed)
   Subjective:    Patient ID: Bryan Hays, male    DOB: 1946-05-01, 68 y.o.   MRN: [id number]  HPI Comments: "I am so much better. I am not having any pain"  Plantar fasciitis - Follow up right heel     Review of Systems no new systemic changes or findings noted     Objective:   Physical Exam Neurovascular status is intact pedal pulses palpable bilateral patient's taping and steroid injection has a good result in symptomology patient has had orthotics in the past over the wore out he tried the orthotics at in health or at this time is a candidate for new orthotics your prescription functional orthotic however his insurance does not cover custom orthoses this time the option of a prefab OTC orthotics is a power step is given patient will obtain power step orthotics with fit and break in wearing instructions given at this time. Followup in the future and as-needed basis if there's a recurrence or exacerbation       Assessment & Plan:  Assessment plantar fasciitis/heel spur syndrome responded to taping and steroid injection patient continues to have symptoms of plantar fascial symptomology has had a history in the past the orthoses recommendations for followup again with orthoses OTC orthotic power step to dispensed followup as needed next progress Harriet Masson DPM

## 2013-12-22 NOTE — Patient Instructions (Signed)

## 2014-04-11 ENCOUNTER — Other Ambulatory Visit: Payer: Self-pay | Admitting: *Deleted

## 2014-04-11 MED ORDER — ROSUVASTATIN CALCIUM 5 MG PO TABS: ORAL_TABLET | ORAL | Status: DC

## 2014-04-18 ENCOUNTER — Other Ambulatory Visit: Payer: Self-pay | Admitting: Internal Medicine

## 2014-04-26 ENCOUNTER — Other Ambulatory Visit: Payer: Self-pay | Admitting: *Deleted

## 2014-04-26 MED ORDER — CELECOXIB 200 MG PO CAPS: 200.0000 mg | ORAL_CAPSULE | Freq: Every day | ORAL | Status: DC

## 2014-04-27 ENCOUNTER — Ambulatory Visit (INDEPENDENT_AMBULATORY_CARE_PROVIDER_SITE_OTHER): Payer: Medicare Other | Admitting: Internal Medicine

## 2014-04-27 ENCOUNTER — Other Ambulatory Visit (INDEPENDENT_AMBULATORY_CARE_PROVIDER_SITE_OTHER): Payer: Medicare Other

## 2014-04-27 ENCOUNTER — Encounter: Payer: Self-pay | Admitting: Internal Medicine

## 2014-04-27 VITALS — BP 168/80 | HR 66 | Temp 97.9°F | Resp 18 | Ht 68.0 in | Wt 221.4 lb

## 2014-04-27 DIAGNOSIS — K219 Gastro-esophageal reflux disease without esophagitis: Secondary | ICD-10-CM

## 2014-04-27 DIAGNOSIS — R6882 Decreased libido: Secondary | ICD-10-CM

## 2014-04-27 DIAGNOSIS — E785 Hyperlipidemia, unspecified: Secondary | ICD-10-CM

## 2014-04-27 DIAGNOSIS — Z0000 Encounter for general adult medical examination without abnormal findings: Secondary | ICD-10-CM

## 2014-04-27 DIAGNOSIS — Z23 Encounter for immunization: Secondary | ICD-10-CM

## 2014-04-27 DIAGNOSIS — M5137 Other intervertebral disc degeneration, lumbosacral region: Secondary | ICD-10-CM

## 2014-04-27 DIAGNOSIS — IMO0001 Reserved for inherently not codable concepts without codable children: Secondary | ICD-10-CM | POA: Insufficient documentation

## 2014-04-27 DIAGNOSIS — R030 Elevated blood-pressure reading, without diagnosis of hypertension: Secondary | ICD-10-CM

## 2014-04-27 LAB — BASIC METABOLIC PANEL WITH GFR
BUN: 20 mg/dL (ref 6–23)
CO2: 28 meq/L (ref 19–32)
Calcium: 9.3 mg/dL (ref 8.4–10.5)
Chloride: 104 meq/L (ref 96–112)
Creatinine, Ser: 0.9 mg/dL (ref 0.4–1.5)
GFR: 91.68 mL/min
Glucose, Bld: 114 mg/dL — ABNORMAL HIGH (ref 70–99)
Potassium: 4.1 meq/L (ref 3.5–5.1)
Sodium: 140 meq/L (ref 135–145)

## 2014-04-27 LAB — CBC
HCT: 45 % (ref 39.0–52.0)
Hemoglobin: 15.1 g/dL (ref 13.0–17.0)
MCHC: 33.7 g/dL (ref 30.0–36.0)
MCV: 91 fl (ref 78.0–100.0)
Platelets: 248 10*3/uL (ref 150.0–400.0)
RBC: 4.95 Mil/uL (ref 4.22–5.81)
RDW: 14.3 % (ref 11.5–15.5)
WBC: 9 10*3/uL (ref 4.0–10.5)

## 2014-04-27 NOTE — Assessment & Plan Note (Signed)
Checking lipid panel today. Adjust regimen as needed although he has had previous side effects with zocor and lipitor.

## 2014-04-27 NOTE — Patient Instructions (Signed)
We will check on your labs today and call you with the results. We will have you check your blood pressure at home 1-2 times per month. If you are seeing high values (>155 top number or >100 bottom number) consistently call our office to come in for a blood pressure check.   Stop taking the celebrex to help your blood pressure go down. We will have you come for a nurse visit to check on the blood pressure in about 3 months.   If you are doing well with blood pressure you can come back to see the doctor in about 1 year, if you have problems or are sick before then feel free to call our office.   Exercise to Lose Weight Exercise and a healthy diet may help you lose weight. Your doctor may suggest specific exercises. EXERCISE IDEAS AND TIPS  Choose low-cost things you enjoy doing, such as walking, bicycling, or exercising to workout videos.  Take stairs instead of the elevator.  Walk during your lunch break.  Park your car further away from work or school.  Go to a gym or an exercise class.  Start with 5 to 10 minutes of exercise each day. Build up to 30 minutes of exercise 4 to 6 days a week.  Wear shoes with good support and comfortable clothes.  Stretch before and after working out.  Work out until you breathe harder and your heart beats faster.  Drink extra water when you exercise.  Do not do so much that you hurt yourself, feel dizzy, or get very short of breath. Exercises that burn about 150 calories:  Running 1  miles in 15 minutes.  Playing volleyball for 45 to 60 minutes.  Washing and waxing a car for 45 to 60 minutes.  Playing touch football for 45 minutes.  Walking 1  miles in 35 minutes.  Pushing a stroller 1  miles in 30 minutes.  Playing basketball for 30 minutes.  Raking leaves for 30 minutes.  Bicycling 5 miles in 30 minutes.  Walking 2 miles in 30 minutes.  Dancing for 30 minutes.  Shoveling snow for 15 minutes.  Swimming laps for 20  minutes.  Walking up stairs for 15 minutes.  Bicycling 4 miles in 15 minutes.  Gardening for 30 to 45 minutes.  Jumping rope for 15 minutes.  Washing windows or floors for 45 to 60 minutes. Document Released: 09/12/2010 Document Revised: 11/02/2011 Document Reviewed: 09/12/2010 [facility] Patient Information 2015 [place], [place]. This information is not intended to replace advice given to you by your health care provider. Make sure you discuss any questions you have with your health care provider.

## 2014-04-27 NOTE — Progress Notes (Signed)
   Subjective:    Patient ID: Bryan Hays, male    DOB: 10-02-1945, 68 y.o.   MRN: [id number]  HPI The patient is a 68 YO man coming in to have his annual exam. He has not had any interval problems and has PMH of hyperlipidemia, GERD, lumbar back pain. 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 He knows that he should lose a little bit of weight but his wife's cooking is too good. He has noticed over the years that his sex drive is less than it used to be and he wonders about his testosterone. He does not have any problems with getting erections or during sex. He denies urinary incontinence or problems with his flow. He does have grandchildren in town and they keep him pretty busy. He does use celebrex for his back and takes it most days.  He denies any chest pains with activity, no SOB, no constipation or diarrhea. No abdominal pains or leg swelling. No problems with his balance and no falls at home.   Review of Systems  Constitutional: Negative for fever, chills, activity change, appetite change, fatigue and unexpected weight change.  Respiratory: Negative for cough, chest tightness, shortness of breath and wheezing.   Cardiovascular: Negative for chest pain, palpitations and leg swelling.  Gastrointestinal: Negative for abdominal pain, diarrhea, constipation and abdominal distention.  Musculoskeletal: Positive for back pain. Negative for arthralgias, joint swelling and myalgias.  Skin: Negative for wound.  Neurological: Negative for dizziness, weakness, numbness and headaches.  Psychiatric/Behavioral: Negative for sleep disturbance and dysphoric mood.      Objective:   Physical Exam  Constitutional: He is oriented to person, place, and time. He appears well-developed and well-nourished.  Obese  HENT:  Head: Normocephalic and atraumatic.  Eyes: EOM are normal.  Neck: Normal range of motion. No JVD present.  Cardiovascular: Normal  rate and regular rhythm.   No murmur heard. Pulmonary/Chest: Effort normal and breath sounds normal. No respiratory distress. He has no wheezes. He has no rales.  Abdominal: Soft. Bowel sounds are normal. He exhibits no distension. There is no tenderness. There is no rebound.  Neurological: He is alert and oriented to person, place, and time. Coordination normal.  Skin: Skin is warm and dry.   Filed Vitals:   04/27/14 1003 04/27/14 1022  BP: 160/88 168/80  Pulse: 66   Temp: 97.9 F (36.6 C)   TempSrc: Oral   Resp: 18   Height: 5\' 8"  (1.727 m)   Weight: 221 lb 6.4 oz (100.426 kg)   SpO2: 97%       Assessment & Plan:

## 2014-04-27 NOTE — Assessment & Plan Note (Signed)
Flu shot given today and discussed getting colonoscopy as he is due.

## 2014-04-27 NOTE — Assessment & Plan Note (Signed)
Patient takes omeprazole daily and advised him that he can trial every other day for several weeks and if no difference either keep every other day or prn only. He knows not to eat meals late at night.

## 2014-04-27 NOTE — Assessment & Plan Note (Signed)
Asked patient to stop celebrex as his blood pressure was elevated and to use tylenol if needed for back pain. He will try and call if having problems.

## 2014-04-27 NOTE — Assessment & Plan Note (Signed)
Patient's blood pressure not often elevated and will have him stop NSAID and come back for nurse visit in 3 months to check on blood pressure. If repeat values are elevated will treat as hypertension.

## 2014-04-27 NOTE — Assessment & Plan Note (Signed)
Per patient request will check testosterone levels, also advised that diet and exercise may provide more energy and drive. Also checking BMP and CBC.

## 2014-04-27 NOTE — Progress Notes (Signed)
Pre visit review using our clinic review tool, if applicable. No additional management support is needed unless otherwise documented below in the visit note. 

## 2014-05-01 LAB — TESTOSTERONE, FREE, TOTAL, SHBG
SEX HORMONE BINDING: 73 nmol/L — AB (ref 13–71)
TESTOSTERONE FREE: 65 pg/mL (ref 47.0–244.0)
TESTOSTERONE-% FREE: 1.2 % — AB (ref 1.6–2.9)
TESTOSTERONE: 540 ng/dL (ref 300–890)

## 2014-05-25 ENCOUNTER — Other Ambulatory Visit: Payer: Self-pay | Admitting: Geriatric Medicine

## 2014-05-25 MED ORDER — ROSUVASTATIN CALCIUM 5 MG PO TABS: ORAL_TABLET | ORAL | Status: DC

## 2014-06-26 ENCOUNTER — Telehealth: Payer: Self-pay | Admitting: Internal Medicine

## 2014-06-28 ENCOUNTER — Other Ambulatory Visit: Payer: Self-pay | Admitting: Geriatric Medicine

## 2014-06-28 MED ORDER — ROSUVASTATIN CALCIUM 5 MG PO TABS: ORAL_TABLET | ORAL | Status: DC

## 2014-07-15 ENCOUNTER — Other Ambulatory Visit: Payer: Self-pay | Admitting: Internal Medicine

## 2014-07-27 ENCOUNTER — Ambulatory Visit: Payer: Medicare Other

## 2014-08-22 ENCOUNTER — Other Ambulatory Visit: Payer: Self-pay | Admitting: Geriatric Medicine

## 2014-08-22 MED ORDER — ROSUVASTATIN CALCIUM 5 MG PO TABS: ORAL_TABLET | ORAL | Status: DC

## 2014-11-26 ENCOUNTER — Other Ambulatory Visit: Payer: Self-pay | Admitting: Internal Medicine

## 2014-11-26 DIAGNOSIS — R109 Unspecified abdominal pain: Secondary | ICD-10-CM

## 2014-11-30 ENCOUNTER — Ambulatory Visit
Admission: RE | Admit: 2014-11-30 | Discharge: 2014-11-30 | Disposition: A | Discharge status: Home / Self Care | Payer: Medicare Other | Source: Ambulatory Visit | Attending: Internal Medicine | Admitting: Internal Medicine

## 2014-11-30 DIAGNOSIS — R109 Unspecified abdominal pain: Secondary | ICD-10-CM

## 2015-01-22 ENCOUNTER — Ambulatory Visit (INDEPENDENT_AMBULATORY_CARE_PROVIDER_SITE_OTHER): Payer: Medicare Other | Admitting: Podiatry

## 2015-01-22 ENCOUNTER — Encounter: Payer: Self-pay | Admitting: Podiatry

## 2015-01-22 ENCOUNTER — Ambulatory Visit (INDEPENDENT_AMBULATORY_CARE_PROVIDER_SITE_OTHER): Payer: Medicare Other

## 2015-01-22 VITALS — BP 140/78 | HR 86 | Resp 15

## 2015-01-22 DIAGNOSIS — M779 Enthesopathy, unspecified: Secondary | ICD-10-CM

## 2015-01-22 DIAGNOSIS — M7752 Other enthesopathy of left foot: Secondary | ICD-10-CM | POA: Diagnosis not present

## 2015-01-22 DIAGNOSIS — M778 Other enthesopathies, not elsewhere classified: Secondary | ICD-10-CM

## 2015-01-22 MED ORDER — METHYLPREDNISOLONE 4 MG PO TBPK: ORAL_TABLET | ORAL | Status: DC

## 2015-01-22 MED ORDER — MELOXICAM 15 MG PO TABS: 15.0000 mg | ORAL_TABLET | Freq: Every day | ORAL | Status: DC

## 2015-01-22 NOTE — Progress Notes (Signed)
   Subjective:    Patient ID: Bryan Hays, male    DOB: November 01, 1945, 69 y.o.   MRN: [id number]  HPI Pt presents with left ankle pain and soreness on lateral side, aggravated by prolonged standing and walking, denies injury. Stated that he first noticed the pain 2 weeks ago and it has progressively worsened and is sharp and shooting in nature  Review of Systems  Cardiovascular: Positive for leg swelling.  All other systems reviewed and are negative.      Objective:   Physical Exam: I have reviewed his past medical history medications allergy surgery social history and review of systems area pulses are palpable bilateral. Neurologic sensorium is intact bilateral. Muscle strength is normal bilateral. He has pain on direct palpation of the sinus tarsi and pain on end range of motion of the subtalar joint with eversion.        Assessment & Plan:  Assessment: The subtalar joint capsulitis left.  Plan: Injected Kenalog and local aesthetic after sterile Betadine skin prep. Kenalog 20 mg was injected. I will follow-up with him in 1 month. Discussed appropriate shoe gear stress she exercises and ice therapy.

## 2015-02-18 ENCOUNTER — Other Ambulatory Visit: Payer: Self-pay

## 2015-02-19 ENCOUNTER — Ambulatory Visit: Payer: Medicare Other | Admitting: Podiatry

## 2015-09-21 ENCOUNTER — Ambulatory Visit (INDEPENDENT_AMBULATORY_CARE_PROVIDER_SITE_OTHER): Payer: Medicare Other | Admitting: Internal Medicine

## 2015-09-21 VITALS — BP 122/72 | HR 95 | Temp 98.7°F | Resp 17 | Ht 66.0 in | Wt 223.0 lb

## 2015-09-21 DIAGNOSIS — J45909 Unspecified asthma, uncomplicated: Secondary | ICD-10-CM | POA: Diagnosis not present

## 2015-09-21 DIAGNOSIS — J0100 Acute maxillary sinusitis, unspecified: Secondary | ICD-10-CM

## 2015-09-21 MED ORDER — HYDROCODONE-HOMATROPINE 5-1.5 MG/5ML PO SYRP: 5.0000 mL | ORAL_SOLUTION | Freq: Four times a day (QID) | ORAL | Status: DC | PRN

## 2015-09-21 MED ORDER — PREDNISONE 20 MG PO TABS: ORAL_TABLET | ORAL | Status: DC

## 2015-09-21 MED ORDER — AMOXICILLIN 875 MG PO TABS: 875.0000 mg | ORAL_TABLET | Freq: Two times a day (BID) | ORAL | Status: DC

## 2015-09-21 NOTE — Progress Notes (Signed)
Subjective:  This chart was scribed for Tami Lin, MD by [facility], medical scribe at [facility].The patient was seen in exam room 09 and the patient's care was started at 9:25 AM.   Patient ID: Bryan Hays, male    DOB: 24-Dec-1945, 70 y.o.   MRN: [id number] Chief Complaint  Patient presents with  . URI  . Cough  . Nasal Congestion   HPI HPI Comments: Bryan Hays is a 70 y.o. male who presents to [facility] due to a cough, sinus congestion and post nasal drip for the past week. The cough is producing a green/yellow phlegm. Associated mild sore throat and some SOB. He has not had any ear pain  Past Medical History  Diagnosis Date  . Hyperlipidemia   . GERD (gastroesophageal reflux disease)   . Degeneration of lumbar or lumbosacral intervertebral disc   . Personal history of urinary calculi   . Allergic rhinitis due to pollen   . Tendinitis    Prior to Admission medications   Medication Sig Start Date End Date Taking? Authorizing Provider  aspirin 81 MG EC tablet Take 81 mg by mouth daily.     Yes Historical Provider, MD  celecoxib (CELEBREX) 200 MG capsule Take 1 capsule (200 mg total) by mouth daily. 04/26/14  Yes Aleksei Plotnikov V, MD  celecoxib (CELEBREX) 200 MG capsule TAKE 1 CAPSULE BY MOUTH DAILY. 07/16/14  Yes Biagio Borg, MD  NEXIUM 40 MG capsule TAKE ONE CAPSULE BY MOUTH EVERY DAY BEFORE BREAKFAST 04/27/12  Yes Neena Rhymes, MD  rosuvastatin (CRESTOR) 5 MG tablet TAKE 1 TABLET BY MOUTH EVERY DAY 08/22/14  Yes Hoyt Koch, MD   Allergies  Allergen Reactions  . Sulfonamide Derivatives     REACTION: NAUSEA \\T \ VOMITING   Review of Systems  HENT: Positive for congestion, postnasal drip and sore throat.   Respiratory: Positive for cough and shortness of breath.       Objective:  BP 122/72 mmHg  Pulse 95  Temp(Src) 98.7 F (37.1 C) (Oral)  Resp 17  Ht 5\' 6"  (1.676 m)  Wt 223 lb (101.152 kg)  BMI  36.01 kg/m2  SpO2 94% Physical Exam  Constitutional: He is oriented to person, place, and time. He appears well-developed and well-nourished. No distress.  HENT:  Head: Normocephalic and atraumatic.  Nares have purulent discharge. Throat is slightly inflamed.  Eyes: Pupils are equal, round, and reactive to light. Left conjunctiva is injected.  Neck: Normal range of motion.  Cardiovascular: Normal rate and regular rhythm.   Pulmonary/Chest: Effort normal. No respiratory distress.  Wheezing on forced expiration bilaterally.  Musculoskeletal: Normal range of motion.  Neurological: He is alert and oriented to person, place, and time.  Skin: Skin is warm and dry.  Psychiatric: He has a normal mood and affect. His behavior is normal.  Nursing note and vitals reviewed.     Assessment & Plan:   I have completed the patient encounter in its entirety as documented by the scribe, with editing by me where necessary. Anessia Oakland P. Laney Pastor, M.D. Acute maxillary sinusitis, recurrence not specified  RAD (reactive airway disease), unspecified asthma severity, uncomplicated  Meds ordered this encounter  Medications  . amoxicillin (AMOXIL) 875 MG tablet    Sig: Take 1 tablet (875 mg total) by mouth 2 (two) times daily.    Dispense:  20 tablet    Refill:  0  . predniSONE (DELTASONE) 20  MG tablet    Sig: 3/3/2/2/1/1 single daily dose for 6 days    Dispense:  12 tablet    Refill:  0  . HYDROcodone-homatropine (HYCODAN) 5-1.5 MG/5ML syrup    Sig: Take 5 mLs by mouth every 6 (six) hours as needed.    Dispense:  120 mL    Refill:  0    By signing my name below, I, Nadim Abuhashem, attest that this documentation has been prepared under the direction and in the presence of Tami Lin, MD.  Electronically Signed: Lora Havens, medical scribe. 09/21/2015, 9:31 AM.

## 2015-12-17 ENCOUNTER — Ambulatory Visit: Payer: Medicare Other | Admitting: Podiatry

## 2016-07-07 ENCOUNTER — Other Ambulatory Visit: Payer: Self-pay | Admitting: Neurological Surgery

## 2016-07-27 NOTE — Progress Notes (Signed)
Pt advised of arrival time of 0815, npo after midnight except for sips with meds in am, denies chest pain shortness of breath.

## 2016-07-28 ENCOUNTER — Inpatient Hospital Stay (HOSPITAL_COMMUNITY): Payer: Medicare Other | Admitting: Certified Registered Nurse Anesthetist

## 2016-07-28 ENCOUNTER — Inpatient Hospital Stay (HOSPITAL_COMMUNITY)
Admission: RE | Admit: 2016-07-28 | Discharge: 2016-07-30 | DRG: 460 | Disposition: A | Discharge status: Home / Self Care | Payer: Medicare Other | Source: Ambulatory Visit | Attending: Neurological Surgery | Admitting: Neurological Surgery

## 2016-07-28 ENCOUNTER — Inpatient Hospital Stay (HOSPITAL_COMMUNITY): Payer: Medicare Other

## 2016-07-28 ENCOUNTER — Encounter (HOSPITAL_COMMUNITY)
Admission: RE | Disposition: A | Discharge status: Home / Self Care | Payer: Self-pay | Source: Ambulatory Visit | Attending: Neurological Surgery

## 2016-07-28 ENCOUNTER — Encounter (HOSPITAL_COMMUNITY): Payer: Self-pay | Admitting: *Deleted

## 2016-07-28 DIAGNOSIS — E785 Hyperlipidemia, unspecified: Secondary | ICD-10-CM | POA: Diagnosis present

## 2016-07-28 DIAGNOSIS — Z882 Allergy status to sulfonamides status: Secondary | ICD-10-CM

## 2016-07-28 DIAGNOSIS — K219 Gastro-esophageal reflux disease without esophagitis: Secondary | ICD-10-CM | POA: Diagnosis present

## 2016-07-28 DIAGNOSIS — Z833 Family history of diabetes mellitus: Secondary | ICD-10-CM

## 2016-07-28 DIAGNOSIS — M4316 Spondylolisthesis, lumbar region: Secondary | ICD-10-CM

## 2016-07-28 DIAGNOSIS — Z8249 Family history of ischemic heart disease and other diseases of the circulatory system: Secondary | ICD-10-CM | POA: Diagnosis not present

## 2016-07-28 DIAGNOSIS — M4317 Spondylolisthesis, lumbosacral region: Secondary | ICD-10-CM | POA: Diagnosis present

## 2016-07-28 DIAGNOSIS — M48061 Spinal stenosis, lumbar region without neurogenic claudication: Secondary | ICD-10-CM | POA: Diagnosis present

## 2016-07-28 DIAGNOSIS — Z419 Encounter for procedure for purposes other than remedying health state, unspecified: Secondary | ICD-10-CM

## 2016-07-28 DIAGNOSIS — Z79899 Other long term (current) drug therapy: Secondary | ICD-10-CM

## 2016-07-28 DIAGNOSIS — Z7982 Long term (current) use of aspirin: Secondary | ICD-10-CM | POA: Diagnosis not present

## 2016-07-28 DIAGNOSIS — J301 Allergic rhinitis due to pollen: Secondary | ICD-10-CM | POA: Diagnosis present

## 2016-07-28 DIAGNOSIS — M5416 Radiculopathy, lumbar region: Secondary | ICD-10-CM | POA: Diagnosis present

## 2016-07-28 DIAGNOSIS — Z91018 Allergy to other foods: Secondary | ICD-10-CM

## 2016-07-28 LAB — CBC
HCT: 43.9 % (ref 39.0–52.0)
Hemoglobin: 14.9 g/dL (ref 13.0–17.0)
MCH: 30.2 pg (ref 26.0–34.0)
MCHC: 33.9 g/dL (ref 30.0–36.0)
MCV: 89 fL (ref 78.0–100.0)
PLATELETS: 253 10*3/uL (ref 150–400)
RBC: 4.93 MIL/uL (ref 4.22–5.81)
RDW: 14.4 % (ref 11.5–15.5)
WBC: 8.9 10*3/uL (ref 4.0–10.5)

## 2016-07-28 LAB — TYPE AND SCREEN
ABO/RH(D): A NEG
Antibody Screen: NEGATIVE

## 2016-07-28 LAB — SURGICAL PCR SCREEN
MRSA, PCR: NEGATIVE
Staphylococcus aureus: NEGATIVE

## 2016-07-28 LAB — BASIC METABOLIC PANEL
Anion gap: 6 (ref 5–15)
BUN: 13 mg/dL (ref 6–20)
CO2: 27 mmol/L (ref 22–32)
CREATININE: 0.86 mg/dL (ref 0.61–1.24)
Calcium: 9.3 mg/dL (ref 8.9–10.3)
Chloride: 107 mmol/L (ref 101–111)
GFR calc Af Amer: >60 mL/min (ref >60)
Glucose, Bld: 119 mg/dL — ABNORMAL HIGH (ref 65–99)
Potassium: 3.9 mmol/L (ref 3.5–5.1)
SODIUM: 140 mmol/L (ref 135–145)

## 2016-07-28 LAB — ABO/RH: ABO/RH(D): A NEG

## 2016-07-28 SURGERY — POSTERIOR LUMBAR FUSION 1 LEVEL
Anesthesia: General | Site: Back

## 2016-07-28 MED ORDER — NEOSTIGMINE METHYLSULFATE 5 MG/5ML IV SOSY
PREFILLED_SYRINGE | INTRAVENOUS | Status: AC
  Filled 2016-07-28: qty 5

## 2016-07-28 MED ORDER — MENTHOL 3 MG MT LOZG: 1.0000 | LOZENGE | OROMUCOSAL | Status: DC | PRN

## 2016-07-28 MED ORDER — THROMBIN 20000 UNITS EX SOLR
CUTANEOUS | Status: DC | PRN
  Administered 2016-07-28: 14:00:00 via TOPICAL

## 2016-07-28 MED ORDER — OXYCODONE-ACETAMINOPHEN 5-325 MG PO TABS
1.0000 | ORAL_TABLET | ORAL | Status: DC | PRN
  Administered 2016-07-28 – 2016-07-30 (×6): 2 via ORAL
  Filled 2016-07-28 (×6): qty 2

## 2016-07-28 MED ORDER — ALUM & MAG HYDROXIDE-SIMETH 200-200-20 MG/5ML PO SUSP: 30.0000 mL | Freq: Four times a day (QID) | ORAL | Status: DC | PRN

## 2016-07-28 MED ORDER — DEXAMETHASONE SODIUM PHOSPHATE 4 MG/ML IJ SOLN
4.0000 mg | Freq: Once | INTRAMUSCULAR | Status: AC
  Administered 2016-07-28: 4 mg via INTRAVENOUS
  Filled 2016-07-28: qty 1

## 2016-07-28 MED ORDER — THROMBIN 5000 UNITS EX SOLR
CUTANEOUS | Status: AC
  Filled 2016-07-28: qty 5000

## 2016-07-28 MED ORDER — LISINOPRIL 5 MG PO TABS
5.0000 mg | ORAL_TABLET | Freq: Every day | ORAL | Status: DC
  Administered 2016-07-29: 5 mg via ORAL
  Filled 2016-07-28 (×2): qty 1

## 2016-07-28 MED ORDER — LACTATED RINGERS IV SOLN
INTRAVENOUS | Status: DC | PRN
  Administered 2016-07-28 (×3): via INTRAVENOUS

## 2016-07-28 MED ORDER — ROCURONIUM BROMIDE 10 MG/ML (PF) SYRINGE
PREFILLED_SYRINGE | INTRAVENOUS | Status: DC | PRN
  Administered 2016-07-28: 40 mg via INTRAVENOUS
  Administered 2016-07-28: 60 mg via INTRAVENOUS

## 2016-07-28 MED ORDER — PROPOFOL 10 MG/ML IV BOLUS
INTRAVENOUS | Status: AC
  Filled 2016-07-28: qty 20

## 2016-07-28 MED ORDER — LACTATED RINGERS IV SOLN
Freq: Once | INTRAVENOUS | Status: AC
  Administered 2016-07-28: 09:00:00 via INTRAVENOUS

## 2016-07-28 MED ORDER — SUGAMMADEX SODIUM 200 MG/2ML IV SOLN
INTRAVENOUS | Status: DC | PRN
  Administered 2016-07-28: 200 mg via INTRAVENOUS

## 2016-07-28 MED ORDER — MIDAZOLAM HCL 5 MG/5ML IJ SOLN
INTRAMUSCULAR | Status: DC | PRN
  Administered 2016-07-28: 2 mg via INTRAVENOUS

## 2016-07-28 MED ORDER — SODIUM CHLORIDE 0.9 % IV SOLN
INTRAVENOUS | Status: DC
Start: 2016-07-28 — End: 2016-07-30
  Administered 2016-07-28: 21:00:00 via INTRAVENOUS

## 2016-07-28 MED ORDER — EPHEDRINE SULFATE 50 MG/ML IJ SOLN
INTRAMUSCULAR | Status: DC | PRN
  Administered 2016-07-28: 5 mg via INTRAVENOUS
  Administered 2016-07-28: 10 mg via INTRAVENOUS
  Administered 2016-07-28 (×2): 5 mg via INTRAVENOUS

## 2016-07-28 MED ORDER — LIDOCAINE-EPINEPHRINE (PF) 2 %-1:200000 IJ SOLN
INTRAMUSCULAR | Status: DC | PRN
  Administered 2016-07-28: 5 mL via INTRADERMAL

## 2016-07-28 MED ORDER — ACETAMINOPHEN 325 MG PO TABS: 650.0000 mg | ORAL_TABLET | ORAL | Status: DC | PRN

## 2016-07-28 MED ORDER — PHENYLEPHRINE 40 MCG/ML (10ML) SYRINGE FOR IV PUSH (FOR BLOOD PRESSURE SUPPORT)
PREFILLED_SYRINGE | INTRAVENOUS | Status: AC
Start: 2016-07-28 — End: 2016-07-28
  Filled 2016-07-28: qty 10

## 2016-07-28 MED ORDER — LIDOCAINE 2% (20 MG/ML) 5 ML SYRINGE
INTRAMUSCULAR | Status: DC | PRN
  Administered 2016-07-28: 100 mg via INTRAVENOUS

## 2016-07-28 MED ORDER — CEFAZOLIN IN D5W 1 GM/50ML IV SOLN
1.0000 g | Freq: Three times a day (TID) | INTRAVENOUS | Status: AC
  Administered 2016-07-29 (×2): 1 g via INTRAVENOUS
  Filled 2016-07-28 (×2): qty 50

## 2016-07-28 MED ORDER — SODIUM CHLORIDE 0.9 % IV SOLN: 250.0000 mL | INTRAVENOUS | Status: DC

## 2016-07-28 MED ORDER — PROMETHAZINE HCL 25 MG/ML IJ SOLN: 6.2500 mg | INTRAMUSCULAR | Status: DC | PRN

## 2016-07-28 MED ORDER — HYDROMORPHONE HCL 1 MG/ML IJ SOLN
0.5000 mg | INTRAMUSCULAR | Status: DC | PRN
  Administered 2016-07-28: 1 mg via INTRAVENOUS
  Filled 2016-07-28: qty 1

## 2016-07-28 MED ORDER — DEXAMETHASONE SODIUM PHOSPHATE 10 MG/ML IJ SOLN
INTRAMUSCULAR | Status: AC
  Filled 2016-07-28: qty 1

## 2016-07-28 MED ORDER — PHENYLEPHRINE HCL 10 MG/ML IJ SOLN
INTRAVENOUS | Status: DC | PRN
  Administered 2016-07-28: 10 ug/min via INTRAVENOUS

## 2016-07-28 MED ORDER — DEXTROSE 5 % IV SOLN
500.0000 mg | Freq: Four times a day (QID) | INTRAVENOUS | Status: DC | PRN
  Filled 2016-07-28: qty 5

## 2016-07-28 MED ORDER — CEFAZOLIN SODIUM 1 G IJ SOLR
INTRAMUSCULAR | Status: AC
  Filled 2016-07-28: qty 20

## 2016-07-28 MED ORDER — THROMBIN 5000 UNITS EX SOLR
OROMUCOSAL | Status: DC | PRN
  Administered 2016-07-28 (×3): via TOPICAL

## 2016-07-28 MED ORDER — CHLORHEXIDINE GLUCONATE CLOTH 2 % EX PADS: 6.0000 | MEDICATED_PAD | Freq: Once | CUTANEOUS | Status: DC

## 2016-07-28 MED ORDER — SUGAMMADEX SODIUM 200 MG/2ML IV SOLN
INTRAVENOUS | Status: AC
  Filled 2016-07-28: qty 2

## 2016-07-28 MED ORDER — GLYCOPYRROLATE 0.2 MG/ML IV SOSY
PREFILLED_SYRINGE | INTRAVENOUS | Status: AC
  Filled 2016-07-28: qty 3

## 2016-07-28 MED ORDER — HYDROMORPHONE HCL 1 MG/ML IJ SOLN: 0.2500 mg | INTRAMUSCULAR | Status: DC | PRN

## 2016-07-28 MED ORDER — ROCURONIUM BROMIDE 10 MG/ML (PF) SYRINGE
PREFILLED_SYRINGE | INTRAVENOUS | Status: AC
  Filled 2016-07-28: qty 10

## 2016-07-28 MED ORDER — CEFAZOLIN SODIUM-DEXTROSE 2-4 GM/100ML-% IV SOLN
2.0000 g | INTRAVENOUS | Status: AC
  Administered 2016-07-28 (×2): 2 g via INTRAVENOUS
  Filled 2016-07-28: qty 100

## 2016-07-28 MED ORDER — MIDAZOLAM HCL 2 MG/2ML IJ SOLN
INTRAMUSCULAR | Status: AC
  Filled 2016-07-28: qty 2

## 2016-07-28 MED ORDER — ACETAMINOPHEN 650 MG RE SUPP: 650.0000 mg | RECTAL | Status: DC | PRN

## 2016-07-28 MED ORDER — MUPIROCIN 2 % EX OINT
1.0000 "application " | TOPICAL_OINTMENT | Freq: Once | CUTANEOUS | Status: AC
  Administered 2016-07-28: 1 via TOPICAL

## 2016-07-28 MED ORDER — MUPIROCIN 2 % EX OINT
TOPICAL_OINTMENT | CUTANEOUS | Status: AC
  Administered 2016-07-28: 1 via TOPICAL
  Filled 2016-07-28: qty 22

## 2016-07-28 MED ORDER — PANTOPRAZOLE SODIUM 40 MG PO TBEC
40.0000 mg | DELAYED_RELEASE_TABLET | Freq: Every day | ORAL | Status: DC
  Administered 2016-07-29 – 2016-07-30 (×2): 40 mg via ORAL
  Filled 2016-07-28 (×2): qty 1

## 2016-07-28 MED ORDER — DEXAMETHASONE SODIUM PHOSPHATE 10 MG/ML IJ SOLN
INTRAMUSCULAR | Status: DC | PRN
  Administered 2016-07-28: 10 mg via INTRAVENOUS

## 2016-07-28 MED ORDER — FENTANYL CITRATE (PF) 100 MCG/2ML IJ SOLN
INTRAMUSCULAR | Status: AC
  Filled 2016-07-28: qty 4

## 2016-07-28 MED ORDER — LORATADINE 10 MG PO TABS
10.0000 mg | ORAL_TABLET | Freq: Every day | ORAL | Status: DC
  Administered 2016-07-29 – 2016-07-30 (×2): 10 mg via ORAL
  Filled 2016-07-28 (×2): qty 1

## 2016-07-28 MED ORDER — FENTANYL CITRATE (PF) 100 MCG/2ML IJ SOLN
INTRAMUSCULAR | Status: AC
  Filled 2016-07-28: qty 2

## 2016-07-28 MED ORDER — LIDOCAINE 2% (20 MG/ML) 5 ML SYRINGE
INTRAMUSCULAR | Status: AC
  Filled 2016-07-28: qty 5

## 2016-07-28 MED ORDER — SODIUM CHLORIDE 0.9 % IR SOLN
Status: DC | PRN
  Administered 2016-07-28: 14:00:00

## 2016-07-28 MED ORDER — ONDANSETRON HCL 4 MG/2ML IJ SOLN: 4.0000 mg | INTRAMUSCULAR | Status: DC | PRN

## 2016-07-28 MED ORDER — METHOCARBAMOL 500 MG PO TABS
500.0000 mg | ORAL_TABLET | Freq: Four times a day (QID) | ORAL | Status: DC | PRN
  Administered 2016-07-28 – 2016-07-30 (×6): 500 mg via ORAL
  Filled 2016-07-28 (×6): qty 1

## 2016-07-28 MED ORDER — HYDROCODONE-ACETAMINOPHEN 5-325 MG PO TABS
1.0000 | ORAL_TABLET | ORAL | Status: DC | PRN
  Filled 2016-07-28: qty 2

## 2016-07-28 MED ORDER — 0.9 % SODIUM CHLORIDE (POUR BTL) OPTIME
TOPICAL | Status: DC | PRN
  Administered 2016-07-28: 1000 mL

## 2016-07-28 MED ORDER — PROPOFOL 10 MG/ML IV BOLUS
INTRAVENOUS | Status: DC | PRN
  Administered 2016-07-28 (×2): 20 mg via INTRAVENOUS
  Administered 2016-07-28: 180 mg via INTRAVENOUS

## 2016-07-28 MED ORDER — LIDOCAINE-EPINEPHRINE (PF) 2 %-1:200000 IJ SOLN
INTRAMUSCULAR | Status: AC
  Filled 2016-07-28: qty 20

## 2016-07-28 MED ORDER — ROSUVASTATIN CALCIUM 5 MG PO TABS
5.0000 mg | ORAL_TABLET | Freq: Every day | ORAL | Status: DC
  Administered 2016-07-29: 5 mg via ORAL
  Filled 2016-07-28: qty 1

## 2016-07-28 MED ORDER — SODIUM CHLORIDE 0.9% FLUSH
3.0000 mL | Freq: Two times a day (BID) | INTRAVENOUS | Status: DC
  Administered 2016-07-29: 3 mL via INTRAVENOUS

## 2016-07-28 MED ORDER — ONDANSETRON HCL 4 MG/2ML IJ SOLN
INTRAMUSCULAR | Status: DC | PRN
  Administered 2016-07-28: 4 mg via INTRAVENOUS

## 2016-07-28 MED ORDER — EPHEDRINE 5 MG/ML INJ
INTRAVENOUS | Status: AC
  Filled 2016-07-28: qty 10

## 2016-07-28 MED ORDER — FENTANYL CITRATE (PF) 100 MCG/2ML IJ SOLN
INTRAMUSCULAR | Status: DC | PRN
  Administered 2016-07-28: 50 ug via INTRAVENOUS
  Administered 2016-07-28 (×2): 100 ug via INTRAVENOUS
  Administered 2016-07-28: 50 ug via INTRAVENOUS

## 2016-07-28 MED ORDER — THROMBIN 20000 UNITS EX SOLR
CUTANEOUS | Status: AC
  Filled 2016-07-28: qty 20000

## 2016-07-28 MED ORDER — PHENYLEPHRINE 40 MCG/ML (10ML) SYRINGE FOR IV PUSH (FOR BLOOD PRESSURE SUPPORT)
PREFILLED_SYRINGE | INTRAVENOUS | Status: DC | PRN
  Administered 2016-07-28: 80 ug via INTRAVENOUS
  Administered 2016-07-28 (×2): 120 ug via INTRAVENOUS
  Administered 2016-07-28: 80 ug via INTRAVENOUS

## 2016-07-28 MED ORDER — ONDANSETRON HCL 4 MG/2ML IJ SOLN
INTRAMUSCULAR | Status: AC
  Filled 2016-07-28: qty 2

## 2016-07-28 MED ORDER — BUPIVACAINE HCL (PF) 0.5 % IJ SOLN
INTRAMUSCULAR | Status: DC | PRN
  Administered 2016-07-28: 5 mL via PERINEURAL
  Administered 2016-07-28: 20 mL via PERINEURAL

## 2016-07-28 MED ORDER — SODIUM CHLORIDE 0.9% FLUSH: 3.0000 mL | INTRAVENOUS | Status: DC | PRN

## 2016-07-28 MED ORDER — PHENOL 1.4 % MT LIQD: 1.0000 | OROMUCOSAL | Status: DC | PRN

## 2016-07-28 SURGICAL SUPPLY — 69 items
ADH SKN CLS APL DERMABOND .7 (GAUZE/BANDAGES/DRESSINGS) ×1
APL SRG 60D 8 XTD TIP BNDBL (TIP)
BAG DECANTER FOR FLEXI CONT (MISCELLANEOUS) ×2 IMPLANT
BASKET BONE COLLECTION (BASKET) ×1 IMPLANT
BLADE CLIPPER SURG (BLADE) ×1 IMPLANT
BUR MATCHSTICK NEURO 3.0 LAGG (BURR) ×3 IMPLANT
CAGE COROENT MP 8X9X23M-8 SPIN (Cage) ×2 IMPLANT
CANISTER SUCT 3000ML PPV (MISCELLANEOUS) ×2 IMPLANT
CARTRIDGE OIL MAESTRO DRILL (MISCELLANEOUS) ×1 IMPLANT
CONNECTOR RELINE 45-65 5.5 (Connector) ×1 IMPLANT
CONT SPEC 4OZ CLIKSEAL STRL BL (MISCELLANEOUS) ×3 IMPLANT
COVER BACK TABLE 60X90IN (DRAPES) ×2 IMPLANT
DECANTER SPIKE VIAL GLASS SM (MISCELLANEOUS) ×1 IMPLANT
DERMABOND ADVANCED (GAUZE/BANDAGES/DRESSINGS) ×1
DERMABOND ADVANCED .7 DNX12 (GAUZE/BANDAGES/DRESSINGS) ×1 IMPLANT
DEVICE DISSECT PLASMABLAD 3.0S (MISCELLANEOUS) ×1 IMPLANT
DIFFUSER DRILL AIR PNEUMATIC (MISCELLANEOUS) ×1 IMPLANT
DRAPE C-ARM 42X72 X-RAY (DRAPES) ×4 IMPLANT
DRAPE HALF SHEET 40X57 (DRAPES) ×1 IMPLANT
DRAPE LAPAROTOMY 100X72X124 (DRAPES) ×2 IMPLANT
DRAPE POUCH INSTRU U-SHP 10X18 (DRAPES) ×2 IMPLANT
DRSG OPSITE POSTOP 4X8 (GAUZE/BANDAGES/DRESSINGS) ×1 IMPLANT
DURAPREP 26ML APPLICATOR (WOUND CARE) ×2 IMPLANT
DURASEAL APPLICATOR TIP (TIP) IMPLANT
DURASEAL SPINE SEALANT 3ML (MISCELLANEOUS) IMPLANT
ELECT REM PT RETURN 9FT ADLT (ELECTROSURGICAL) ×2
ELECTRODE REM PT RTRN 9FT ADLT (ELECTROSURGICAL) ×1 IMPLANT
GAUZE SPONGE 4X4 12PLY STRL (GAUZE/BANDAGES/DRESSINGS) ×2 IMPLANT
GAUZE SPONGE 4X4 16PLY XRAY LF (GAUZE/BANDAGES/DRESSINGS) IMPLANT
GLOVE BIOGEL PI IND STRL 8.5 (GLOVE) ×2 IMPLANT
GLOVE BIOGEL PI INDICATOR 8.5 (GLOVE) ×3
GLOVE ECLIPSE 7.0 STRL STRAW (GLOVE) ×1 IMPLANT
GLOVE ECLIPSE 8.5 STRL (GLOVE) ×5 IMPLANT
GLOVE INDICATOR 7.5 STRL GRN (GLOVE) ×1 IMPLANT
GLOVE SURG SS PI 6.5 STRL IVOR (GLOVE) ×2 IMPLANT
GOWN STRL REUS W/ TWL LRG LVL3 (GOWN DISPOSABLE) IMPLANT
GOWN STRL REUS W/ TWL XL LVL3 (GOWN DISPOSABLE) IMPLANT
GOWN STRL REUS W/TWL 2XL LVL3 (GOWN DISPOSABLE) ×4 IMPLANT
GOWN STRL REUS W/TWL LRG LVL3 (GOWN DISPOSABLE) ×4
GOWN STRL REUS W/TWL XL LVL3 (GOWN DISPOSABLE)
HEMOSTAT POWDER KIT SURGIFOAM (HEMOSTASIS) ×3 IMPLANT
KIT BASIN OR (CUSTOM PROCEDURE TRAY) ×2 IMPLANT
KIT INFUSE X SMALL 1.4CC (Orthopedic Implant) ×1 IMPLANT
KIT ROOM TURNOVER OR (KITS) ×2 IMPLANT
MODULE POWER NUVASIVE (MISCELLANEOUS) IMPLANT
NEEDLE HYPO 22GX1.5 SAFETY (NEEDLE) ×2 IMPLANT
NS IRRIG 1000ML POUR BTL (IV SOLUTION) ×2 IMPLANT
OIL CARTRIDGE MAESTRO DRILL (MISCELLANEOUS)
PACK LAMINECTOMY NEURO (CUSTOM PROCEDURE TRAY) ×2 IMPLANT
PAD ARMBOARD 7.5X6 YLW CONV (MISCELLANEOUS) ×6 IMPLANT
PATTIES SURGICAL .5 X1 (DISPOSABLE) ×2 IMPLANT
PATTIES SURGICAL 1X1 (DISPOSABLE) ×2 IMPLANT
PLASMABLADE 3.0S (MISCELLANEOUS) ×2
POWER MODULE NUVASIVE (MISCELLANEOUS) ×2
ROD RELINE-O LORD 5.5X35MM (Rod) ×2 IMPLANT
SCREW LOCK RELINE 5.5 TULIP (Screw) ×4 IMPLANT
SCREW RELINE-O POLY 6.5X45 (Screw) ×4 IMPLANT
SPONGE LAP 4X18 X RAY DECT (DISPOSABLE) IMPLANT
SPONGE SURGIFOAM ABS GEL 100 (HEMOSTASIS) ×2 IMPLANT
SUT PROLENE 6 0 BV (SUTURE) IMPLANT
SUT VIC AB 1 CT1 18XBRD ANBCTR (SUTURE) ×1 IMPLANT
SUT VIC AB 1 CT1 8-18 (SUTURE) ×2
SUT VIC AB 2-0 CP2 18 (SUTURE) ×2 IMPLANT
SUT VIC AB 3-0 SH 8-18 (SUTURE) ×3 IMPLANT
SYR 3ML LL SCALE MARK (SYRINGE) ×8 IMPLANT
TOWEL OR 17X24 6PK STRL BLUE (TOWEL DISPOSABLE) ×2 IMPLANT
TOWEL OR 17X26 10 PK STRL BLUE (TOWEL DISPOSABLE) ×2 IMPLANT
TRAY FOLEY W/METER SILVER 16FR (SET/KITS/TRAYS/PACK) ×2 IMPLANT
WATER STERILE IRR 1000ML POUR (IV SOLUTION) ×2 IMPLANT

## 2016-07-28 NOTE — H&P (Signed)
Bryan Hays is an 70 y.o. male.   Chief Complaint: Back pain and bilateral leg pain worse with walking. HPI: Patient is a 70 year old individual who's had significant chronic history of back pain for years. He's had multiple efforts at conservative management but in the past years time he's had increasing leg pain with significant numbness into the feet and weakness in his tibialis anterior. And MRI recently demonstrates that the patient has a severe stenosis at the L5-S1 level with grade 1 spondylolisthesis. Plain x-rays demonstrates that he has 13 mm of anterolisthesis at the L5-S1 level. After discussing his efforts at conservative management reviewing their failure I advised that he should undergo surgical decompression arthrodesis at the L5-S1 level. Is now omitted for the procedure.  Past Medical History:  Diagnosis Date  . Allergic rhinitis due to pollen   . Degeneration of lumbar or lumbosacral intervertebral disc   . GERD (gastroesophageal reflux disease)   . Hyperlipidemia   . Personal history of urinary calculi   . Tendinitis     Past Surgical History:  Procedure Laterality Date  . FOOT SURGERY     Hx plantar fasciitis  . SHOULDER ARTHROSCOPY  2012   right shoulder   . TONSILLECTOMY      Family History  Problem Relation Age of Onset  . COPD Father   . Coronary artery disease Father   . Heart disease Father     chf  . Hypertension Father   . Diabetes Father   . Other Father     Cdiff  . Heart disease Mother     cardiovascular disease  . Alzheimer's disease Mother   . Diabetes Mother   . Cancer Neg Hx     colon or prostate   Social History:  reports that he has never smoked. He has never used smokeless tobacco. He reports that he drinks alcohol. He reports that he does not use drugs.  Allergies:  Allergies  Allergen Reactions  . Banana Nausea And Vomiting    G.I. UPSET  . Sulfonamide Derivatives Nausea And Vomiting    Medications Prior to Admission   Medication Sig Dispense Refill  . aspirin 81 MG EC tablet Take 81 mg by mouth daily.      . cetirizine (ZYRTEC) 10 MG tablet Take 10 mg by mouth daily as needed for allergies.    Marland Kitchen esomeprazole (NEXIUM) 20 MG capsule Take 20 mg by mouth daily at 12 noon.    Marland Kitchen lisinopril (PRINIVIL,ZESTRIL) 5 MG tablet Take 5 mg by mouth daily.  0  . naproxen sodium (ANAPROX) 220 MG tablet Take 220 mg by mouth 2 (two) times daily as needed (for back pain.).    Marland Kitchen rosuvastatin (CRESTOR) 5 MG tablet TAKE 1 TABLET BY MOUTH EVERY DAY 30 tablet 3    Results for orders placed or performed during the hospital encounter of 07/28/16 (from the past 48 hour(s))  Type and screen     Status: None   Collection Time: 07/28/16  8:45 AM  Result Value Ref Range   ABO/RH(D) A NEG    Antibody Screen NEG    Sample Expiration 07/31/2016   ABO/Rh     Status: None   Collection Time: 07/28/16  8:45 AM  Result Value Ref Range   ABO/RH(D) A NEG   Surgical pcr screen     Status: None   Collection Time: 07/28/16  8:46 AM  Result Value Ref Range   MRSA, PCR NEGATIVE NEGATIVE   Staphylococcus aureus  NEGATIVE NEGATIVE    Comment:        The Xpert SA Assay (FDA approved for NASAL specimens in patients over 3 years of age), is one component of a comprehensive surveillance program.  Test performance has been validated by [facility] for patients greater than or equal to 30 year old. It is not intended to diagnose infection nor to guide or monitor treatment.   CBC     Status: None   Collection Time: 07/28/16  8:47 AM  Result Value Ref Range   WBC 8.9 4.0 - 10.5 K/uL   RBC 4.93 4.22 - 5.81 MIL/uL   Hemoglobin 14.9 13.0 - 17.0 g/dL   HCT 43.9 39.0 - 52.0 %   MCV 89.0 78.0 - 100.0 fL   MCH 30.2 26.0 - 34.0 pg   MCHC 33.9 30.0 - 36.0 g/dL   RDW 14.4 11.5 - 15.5 %   Platelets 253 150 - 400 K/uL  Basic metabolic panel     Status: Abnormal   Collection Time: 07/28/16  8:47 AM  Result Value Ref Range   Sodium 140 135 - 145  mmol/L   Potassium 3.9 3.5 - 5.1 mmol/L   Chloride 107 101 - 111 mmol/L   CO2 27 22 - 32 mmol/L   Glucose, Bld 119 (H) 65 - 99 mg/dL   BUN 13 6 - 20 mg/dL   Creatinine, Ser 0.86 0.61 - 1.24 mg/dL   Calcium 9.3 8.9 - 10.3 mg/dL   GFR calc non Af Amer >60 >60 mL/min   GFR calc Af Amer >60 >60 mL/min    Comment: (NOTE) The eGFR has been calculated using the CKD EPI equation. This calculation has not been validated in all clinical situations. eGFR's persistently <60 mL/min signify possible Chronic Kidney Disease.    Anion gap 6 5 - 15   No results found.  Review of Systems  Constitutional: Negative.   HENT: Negative.   Eyes: Negative.   Respiratory: Negative.   Cardiovascular: Negative.   Gastrointestinal: Negative.   Genitourinary: Negative.   Musculoskeletal: Positive for back pain.  Skin: Negative.   Neurological:       Leg pain with walking  Endo/Heme/Allergies: Negative.   Psychiatric/Behavioral: Negative.     Blood pressure (!) 154/72, pulse 86, temperature 98 F (36.7 C), temperature source Oral, resp. rate 20, height 5' 6.5" (1.689 m), weight 98.9 kg (218 lb), SpO2 97 %. Physical Exam  Constitutional: He is oriented to person, place, and time. He appears well-developed and well-nourished.  HENT:  Head: Normocephalic and atraumatic.  Right Ear: External ear normal.  Left Ear: External ear normal.  Eyes: Conjunctivae and EOM are normal. Pupils are equal, round, and reactive to light.  Neck: Normal range of motion. Neck supple.  Cardiovascular: Normal rate, regular rhythm and normal heart sounds.   Respiratory: Effort normal and breath sounds normal.  GI: Soft. Bowel sounds are normal.  Musculoskeletal: Normal range of motion.  Positive straight leg raising at 30 in either lower extremity Patrick's maneuver is negative bilaterally. Outpatient of lumbar sacral spine reproduces some localized tenderness with percussion only. No other significant paravertebral spasm  is noted.  Neurological: He is alert and oriented to person, place, and time.  Mild weakness in the tibialis anterior bilaterally is noted at 4+ out of 5  Skin: Skin is warm and dry.     Assessment/Plan Spondylolisthesis L5-S1 with bilateral L5 radiculopathy. Neurogenic claudication.  Plan: Decompression L5-S1 posterior lumbar interbody arthrodesis pedicle screw  fixation L5-S1  Earleen Newport, MD 07/28/2016, 12:57 PM

## 2016-07-28 NOTE — Op Note (Signed)
Date of surgery: 07/28/2016 Preoperative diagnosis: Spondylolisthesis L5-S1 with bilateral L5 radiculopathy Postoperative diagnosis: Same Procedure: Decompression of L5-S1 via laminectomy and decompression of the L5 and S1 nerve roots with more work than required for simple posterior interbody arthrodesis.  Posterior lumbar interbody arthrodesis with local autograft and infuse posterior lateral arthrodesis L5-S1 with local autograft and infuse peek spacers pedicle screw fixation L5-S1.  Surgeon: Kristeen Miss Anesthesia: Gen. endotracheal Indications: Bryan Hays is a 70 year old individual's had significant back and bilateral lower extremity pain has evidence of advanced spondylolisthesis with severe stenosis involving the L5 nerve roots. He's been advised regarding the need for surgical decompression and arthrodesis at the L5-S1 level.  Procedure: The patient was brought to the operating room supine on a stretcher. After the smooth induction of general endotracheal anesthesia, he was turned prone. The back was prepped with alcohol DuraPrep and draped in a sterile fashion. Midline incision was created in carried down to the lumbar dorsal fascia. Fascia was opened on either side of midline and first spinous process was noted to be that of L5. Spinous process itself was noted to be very loose. Dissection found that it was a complete isthmic spondylolisthesis. I carefully dissecting around the base of the laminar arch and the facet joints the arch of L5 was removed and a single piece. Then the area was decompressed with care being taken to protect decompress the L5 nerve roots far out into the foramen. The S1 nerve roots were decompressed in a similar fashion using 2 and 3 mm Kerrison punch. Hemostasis in the epidural spaces was achieved. Veins in the epidural space were also cauterized to allow him to be retracted medially. The disc space was identified. It was incised with a #15 blade. With a significant  spondylitic degenerative changes noted the disc space was entered and a significant quantity of severely desiccated disc material was encountered. A series of and shavers was then used to open the space up and distracted to 7 mm height. Then by working from side to side total discectomy was performed at the L5-S1 level and the endplates were decorticated. Disc material from the central subligamentous portion was removed also to allow for further decompression of the central dural canal. L5 nerve roots at this point were well decompressed far out into the foramen. With this the interspace was sized for an 8 mm tall 23 mm long 8 lordotic spacer. Autograft from the laminar arch was then used to fill the void along with 2 small strips of infuse from a small application of infuse. The cages along with a total of 6 mL of bone graft were filled into the interspace to strips of infuse were used. The lateral gutters were then decorticated between L5 and S1 and in the lateral gutters autograft with infuse was also packed. Pedicle entry sites were then chosen at L5 and S1 and 6.5 x 45 mm screws were placed in both L5 and S1. A neutral construct was constructed using to 35 mm precontoured rods. A transverse connector was also applied to give additional rigidity to the fixation. With this hemostasis was achieved. Near final closure the count of cottonoids was off by one and several x-rays revealed the presence of a cottonoid near the lateral aspect of the pedicle screw at L5 on the right side. After some searching this could be retrieved without straining. With this the wound was closed with lumbar dorsal fascia being closed with #1 Vicryl in interrupted fashion 2-0 Vicryl subcutaneous anus tissues  3-0 Vicryl subsequently. Dermabond was used on the skin. Blood loss is estimated 150 mL. Patient tolerated procedure well and returned to recovery room in stable condition.

## 2016-07-28 NOTE — Transfer of Care (Signed)
Immediate Anesthesia Transfer of Care Note  Patient: Bryan Hays  Procedure(s) Performed: Procedure(s) with comments: LUMBAR FIVE - SACRAL ONE POSTERIOR LUMBAR INTERBODY FUSION (N/A) - LUMBAR FIVE - SACRAL ONE POSTERIOR LUMBAR INTERBODY FUSION  Patient Location: PACU  Anesthesia Type:General  Level of Consciousness: awake and alert   Airway & Oxygen Therapy: Patient Spontanous Breathing  Post-op Assessment: Report given to RN, Post -op Vital signs reviewed and stable and Patient moving all extremities X 4  Post vital signs: Reviewed and stable  Last Vitals:  Vitals:   07/28/16 0900 07/28/16 1742  BP: (!) 154/72   Pulse: 86   Resp: 20   Temp: 36.7 C (P) 36.6 C    Last Pain:  Vitals:   07/28/16 0900  TempSrc: Oral         Complications: No apparent anesthesia complications

## 2016-07-28 NOTE — Anesthesia Postprocedure Evaluation (Signed)
Anesthesia Post Note  Patient: Bryan Hays  Procedure(s) Performed: Procedure(s) (LRB): LUMBAR FIVE - SACRAL ONE POSTERIOR LUMBAR INTERBODY FUSION (N/A)  Patient location during evaluation: PACU Anesthesia Type: General Level of consciousness: awake, awake and alert and oriented Pain management: pain level controlled Vital Signs Assessment: post-procedure vital signs reviewed and stable Respiratory status: spontaneous breathing, nonlabored ventilation and respiratory function stable Cardiovascular status: blood pressure returned to baseline Anesthetic complications: no    Last Vitals:  Vitals:   07/28/16 1742 07/28/16 1744  BP:  127/75  Pulse:  (!) 103  Resp:  (!) 9  Temp: 36.6 C     Last Pain:  Vitals:   07/28/16 0900  TempSrc: Oral                 Candler Ginsberg COKER

## 2016-07-28 NOTE — Anesthesia Preprocedure Evaluation (Signed)
Anesthesia Evaluation  Patient identified by MRN, date of birth, ID band Patient awake    Reviewed: Allergy & Precautions, Patient's Chart, lab work & pertinent test results  History of Anesthesia Complications Negative for: history of anesthetic complications  Airway Mallampati: I  TM Distance: >3 FB Neck ROM: Full    Dental  (+) Teeth Intact   Pulmonary neg pulmonary ROS,    breath sounds clear to auscultation       Cardiovascular  Rhythm:Regular Rate:Normal     Neuro/Psych    GI/Hepatic Neg liver ROS, GERD  ,  Endo/Other  negative endocrine ROS  Renal/GU negative Renal ROS     Musculoskeletal  (+) Arthritis ,   Abdominal   Peds  Hematology   Anesthesia Other Findings   Reproductive/Obstetrics                             Anesthesia Physical Anesthesia Plan  ASA: I  Anesthesia Plan: General   Post-op Pain Management:    Induction: Intravenous  Airway Management Planned:   Additional Equipment:   Intra-op Plan:   Post-operative Plan: Extubation in OR  Informed Consent: I have reviewed the patients History and Physical, chart, labs and discussed the procedure including the risks, benefits and alternatives for the proposed anesthesia with the patient or authorized representative who has indicated his/her understanding and acceptance.     Plan Discussed with: CRNA  Anesthesia Plan Comments:         Anesthesia Quick Evaluation

## 2016-07-29 NOTE — Evaluation (Signed)
Physical Therapy Evaluation Patient Details Name: Bryan Hays MRN: [id number] DOB: 01-May-1946 Today's Date: 07/29/2016   History of Present Illness  This 70 y.o. male admitted for L5-S1 PLIF.  PMH noncontributory  Clinical Impression  Patient is s/p above surgery resulting in functional limitations due to the deficits listed below (see PT Problem List). Pt ambulated 400' mostly without RW as well as ascending 4 steps with rail and min-guard A. Pt limited by pain but otherwise mobilizing well.  Patient will benefit from skilled PT to increase their independence and safety with mobility to allow discharge to the venue listed below.       Follow Up Recommendations Home health PT    Equipment Recommendations  None recommended by PT    Recommendations for Other Services       Precautions / Restrictions Precautions Precautions: Back Precaution Booklet Issued: No Precaution Comments: reviewed back precautions and use of brace Required Braces or Orthoses: Spinal Brace Spinal Brace: Lumbar corset;Applied in sitting position Restrictions Weight Bearing Restrictions: No      Mobility  Bed Mobility Overal bed mobility: Needs Assistance Bed Mobility: Sit to Sidelying;Rolling;Sidelying to Sit Rolling: Supervision Sidelying to sit: Supervision     Sit to sidelying: Min assist General bed mobility comments: pt able to get to EOB safely keeping precautions but had difficulty with return to supine, min A to LE's to get them into bed before rolling onto back  Transfers Overall transfer level: Needs assistance Equipment used: Rolling walker (2 wheeled);None Transfers: Sit to/from Stand Sit to Stand: Supervision         General transfer comment: pt stood safely with and without RW  Ambulation/Gait Ambulation/Gait assistance: Supervision;Min guard Ambulation Distance (Feet): 400 Feet Assistive device: Rolling walker (2 wheeled);None Gait Pattern/deviations: Step-through  pattern Gait velocity: WFL Gait velocity interpretation: at or above normal speed for age/gender General Gait Details: 85' with RW and then pt felt he could ambulate with no AD, min-guard for safety but no LOB or deviations  Stairs Stairs: Yes Stairs assistance: Min guard Stair Management: One rail Left;Alternating pattern;Forwards Number of Stairs: 4 General stair comments: pt comfortable with alternating pattern, no LOB  Wheelchair Mobility    Modified Rankin (Stroke Patients Only)       Balance Overall balance assessment: No apparent balance deficits (not formally assessed)                                           Pertinent Vitals/Pain Pain Assessment: Faces Faces Pain Scale: Hurts little more Pain Location: back Pain Descriptors / Indicators: Sore Pain Intervention(s): Limited activity within patient's tolerance;Monitored during session;Premedicated before session    Home Living Family/patient expects to be discharged to:: Private residence Living Arrangements: Spouse/significant other Available Help at Discharge: Family;Available 24 hours/day Type of Home: House Home Access: Stairs to enter Entrance Stairs-Rails: Right Entrance Stairs-Number of Steps: 5 Home Layout: One level Home Equipment: Walker - 2 wheels;Grab bars - toilet;Grab bars - tub/shower;Hand held shower head;Shower seat      Prior Function Level of Independence: Independent         Comments: 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   Dominant Hand: Right    Extremity/Trunk Assessment   Upper Extremity Assessment: Defer to OT evaluation  Lower Extremity Assessment: Overall WFL for tasks assessed      Cervical / Trunk Assessment: Normal  Communication   Communication: No difficulties  Cognition Arousal/Alertness: Awake/alert Behavior During Therapy: WFL for tasks assessed/performed Overall Cognitive Status: Within  Functional Limits for tasks assessed                      General Comments      Exercises     Assessment/Plan    PT Assessment Patient needs continued PT services  PT Problem List Decreased activity tolerance;Decreased balance;Decreased mobility;Decreased knowledge of use of DME;Decreased knowledge of precautions;Pain          PT Treatment Interventions DME instruction;Gait training;Stair training;Functional mobility training;Therapeutic activities;Therapeutic exercise;Patient/family education    PT Goals (Current goals can be found in the Care Plan section)  Acute Rehab PT Goals Patient Stated Goal: return to home and golf PT Goal Formulation: With patient Time For Goal Achievement: 08/05/16 Potential to Achieve Goals: Good    Frequency Min 5X/week   Barriers to discharge        Co-evaluation               End of Session Equipment Utilized During Treatment: Gait belt;Back brace Activity Tolerance: Patient tolerated treatment well Patient left: in bed;with call bell/phone within reach Nurse Communication: Mobility status         Time: [id number] PT Time Calculation (min) (ACUTE ONLY): 27 min   Charges:   PT Evaluation $PT Eval Moderate Complexity: 1 Procedure PT Treatments $Gait Training: 8-22 mins   PT G Codes:      Leighton Roach, PT  Acute Rehab Services  [place] 07/29/2016, 4:22 PM

## 2016-07-29 NOTE — Progress Notes (Signed)
Patient ID: Bryan Hays, male   DOB: [birth year], 70 y.o.   MRN: [id number] Vital signs are stable Motor function appears intact Races not arrived yet Patient feeling reasonably comfortable Mobilized today as tolerated

## 2016-07-29 NOTE — Evaluation (Signed)
Occupational Therapy Evaluation Patient Details Name: Bryan Hays MRN: [id number] DOB: 1946-04-10 Today's Date: 07/29/2016    History of Present Illness This 70 y.o. male admitted for L5-S1 PLIF.  PMH noncontributory   Clinical Impression   Patient evaluated by Occupational Therapy with no further acute OT needs identified. All education has been completed and the patient has no further questions. Pt is able to perform ADLs at supervision level.  Wife is very supportive.  All education completed.  See below for any follow-up Occupational Therapy or equipment needs. OT is signing off. Thank you for this referral. ]    Follow Up Recommendations  No OT follow up;Supervision/Assistance - 24 hour    Equipment Recommendations  None recommended by OT    Recommendations for Other Services       Precautions / Restrictions Precautions Precautions: Back Precaution Booklet Issued: Yes (comment) Precaution Comments: Pt provided with back handout and precautions reviewed. He demonstrates good awareness of precautions  Required Braces or Orthoses: Spinal Brace Spinal Brace: Lumbar corset;Applied in sitting position Restrictions Weight Bearing Restrictions: No      Mobility Bed Mobility Overal bed mobility: Needs Assistance Bed Mobility: Rolling;Sidelying to Sit Rolling: Supervision Sidelying to sit: Supervision       General bed mobility comments: Pt instructed in log rolling techniques.  Requires use of bedrail for rolling   Transfers Overall transfer level: Needs assistance   Transfers: Sit to/from Stand;Stand Pivot Transfers Sit to Stand: Supervision Stand pivot transfers: Supervision            Balance Overall balance assessment: No apparent balance deficits (not formally assessed)                                          ADL Overall ADL's : Needs assistance/impaired Eating/Feeding: Independent   Grooming: Wash/dry hands;Wash/dry face;Oral  care;Brushing hair;Supervision/safety;Standing Grooming Details (indicate cue type and reason): reviewed safe technique for brushing teeth and shaving  Upper Body Bathing: Set up;Supervision/ safety;Sitting   Lower Body Bathing: Supervison/ safety;Sit to/from stand   Upper Body Dressing : Supervision/safety;Set up;Sitting   Lower Body Dressing: Supervision/safety;Set up;Sit to/from stand   Toilet Transfer: Supervision/safety;Ambulation;Comfort height toilet;Grab bars;RW Armed forces technical officer Details (indicate cue type and reason): reviewed safe technique  Toileting- Clothing Manipulation and Hygiene: Supervision/safety;Sit to/from stand   Tub/ Shower Transfer: Walk-in shower;Supervision/safety;Ambulation;Shower seat;Grab bars;Rolling walker   Functional mobility during ADLs: Supervision/safety;Rolling walker General ADL Comments: Pt is able to cross ankles over knees to access feet.  Reviewed safe technique for LB ADLs.   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, mining      Pertinent Vitals/Pain Pain Assessment: Faces Faces Pain Scale: Hurts a little bit Pain Location: BACK  Pain Descriptors / Indicators: Operative site guarding Pain Intervention(s): Monitored during session     Hand Dominance Right   Extremity/Trunk Assessment Upper Extremity Assessment Upper Extremity Assessment: Overall WFL for tasks assessed   Lower Extremity Assessment Lower Extremity Assessment: Defer to PT evaluation       Communication Communication Communication: No difficulties   Cognition Arousal/Alertness: Awake/alert Behavior During Therapy: WFL for tasks assessed/performed Overall Cognitive Status: Within Functional Limits for tasks assessed       Memory:  Decreased recall of precautions             General Comments       Exercises       Shoulder  Instructions      Home Living Family/patient expects to be discharged to:: Private residence Living Arrangements: Spouse/significant other Available Help at Discharge: Family;Available 24 hours/day Type of Home: House Home Access: Stairs to enter [employer] of Steps: 5 Entrance Stairs-Rails: Right Home Layout: One level     Bathroom Shower/Tub: Tub/shower unit;Walk-in shower   Bathroom Toilet: Handicapped height Bathroom Accessibility: Yes How Accessible: Accessible via walker Home Equipment: Walker - 2 wheels;Grab bars - toilet;Grab bars - tub/shower;Hand held shower head;Shower seat          Prior Functioning/Environment Level of Independence: Independent        Comments: 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 Problem List: Decreased knowledge of use of DME or AE;Decreased knowledge of precautions;Pain   OT Treatment/Interventions:      OT Goals(Current goals can be found in the care plan section) Acute Rehab OT Goals OT Goal Formulation: All assessment and education complete, DC therapy  OT Frequency:     Barriers to D/C:            Co-evaluation              End of Session Equipment Utilized During Treatment: Back brace;Rolling walker;Gait belt Nurse Communication: Mobility status  Activity Tolerance: Patient tolerated treatment well Patient left: in chair;with call bell/phone within reach;with chair alarm set;with family/visitor present   Time: [id number] OT Time Calculation (min): 39 min Charges:  OT General Charges $OT Visit: 1 Procedure OT Evaluation $OT Eval Low Complexity: 1 Procedure OT Treatments $Self Care/Home Management : 23-37 mins G-Codes:    Liliana Brentlinger M 25-Aug-2016, 12:28 PM

## 2016-07-30 MED ORDER — DEXAMETHASONE 1 MG PO TABS: ORAL_TABLET | ORAL | 0 refills | Status: DC

## 2016-07-30 MED ORDER — OXYCODONE-ACETAMINOPHEN 5-325 MG PO TABS: 1.0000 | ORAL_TABLET | ORAL | 0 refills | Status: DC | PRN

## 2016-07-30 MED ORDER — DIAZEPAM 5 MG PO TABS: 5.0000 mg | ORAL_TABLET | Freq: Four times a day (QID) | ORAL | 0 refills | Status: DC | PRN

## 2016-07-30 MED FILL — Sodium Chloride IV Soln 0.9%: INTRAVENOUS | Qty: 3000 | Status: AC

## 2016-07-30 MED FILL — Thrombin For Soln 5000 Unit: CUTANEOUS | Qty: 5000 | Status: AC

## 2016-07-30 NOTE — Discharge Instructions (Signed)
Spinal Fusion, Care After Refer to this sheet in the next few weeks. These instructions provide you with information about caring for yourself after your procedure. Your health care provider may also give you more specific instructions. Your treatment has been planned according to current medical practices, but problems sometimes occur. Call your health care provider if you have any problems or questions after your procedure. WHAT TO EXPECT AFTER THE PROCEDURE After your procedure, it is common to have:  Pain and stiffness in your back.  Pain around your incision. HOME CARE INSTRUCTIONS  Medicines  Take over-the-counter and prescription medicines only as told by your health care provider. These include any pain medicines.  Do not drive for 24 hours if you received a sedative.  Do not drive or operate heavy machinery while taking prescription pain medicine.  If you were prescribed an antibiotic medicine, take it as told by your health care provider. Do not stop taking the antibiotic even if you start to feel better. Incision Care  Follow instructions from your health care provider about how to take care of your incision. Make sure you:  Wash your hands with soap and water before you change your bandage (dressing). If soap and water are not available, use hand sanitizer.  Change your dressing as told by your health care provider.  Leave stitches (sutures), skin glue, or adhesive strips in place. These skin closures may need to be in place for 2 weeks or longer. If adhesive strip edges start to loosen and curl up, you may trim the loose edges. Do not remove adhesive strips completely unless your health care provider tells you to do that.  Keep your incision clean and dry. Do not take baths, swim, or use a hot tub until your health care provider approves.  Check your incision and the surrounding area every day for redness, swelling, and leaking fluid. Physical Activity  Return to your  normal activities as told by your health care provider. Ask your health care provider what activities are safe for you. Rest and protect your back as much as possible.  Follow instructions from your health care provider about how to move and use good posture to help your spine heal.  Do not lift anything that is heavier than 8 lb (3.6 kg)or the limit that your health care provider tells you until he or she says that it is safe. Avoid lifting anything over your head.  Do not twist or bend at the waist until your health care provider approves.  Avoid pushing and pulling motions.  Avoid sitting or lying down in the same position for long periods of time.  Do not begin exercising until told by your health care provider. Ask your health care provider what kinds of exercise you can do to make your back stronger. General Instructions  If you were given a brace, use it as told by your health care provider.  Wear compression stockings as told by your health care provider. These stockings help to prevent blood clots and reduce swelling in your legs.  Do not use tobacco products, including cigarettes, chewing tobacco, or e-cigarettes. If you need help quitting, ask your health care provider.  Keep all follow-up visits as told by your health care provider and, if necessary, your physical therapist. This is important. SEEK MEDICAL CARE IF:  You have pain that gets worse or does not get better with medicine.  Your legs or feet become painful or swollen.  You have redness, swelling, or  pain at the site of your incision.  You have fluid, blood, or pus coming from your incision.  You vomit or feel nauseous.  You have weakness or numbness in your legs that is new or getting worse.  You have a fever.  You have trouble controlling urination or bowel movements. SEEK IMMEDIATE MEDICAL CARE IF:   You have severe pain.  You have chest pain.  You have trouble breathing.  You develop a  cough. These symptoms may represent a serious problem that is an emergency. Do not wait to see if the symptoms will go away. Get medical help right away. Call your local emergency services (911 in the [place] Do not drive yourself to the hospital. This information is not intended to replace advice given to you by your health care provider. Make sure you discuss any questions you have with your health care provider. Document Released: 02/27/2005 Document Revised: 12/02/2015 Document Reviewed: 01/23/2015 Elsevier Interactive Patient Education  2017 [employer].

## 2016-07-30 NOTE — Progress Notes (Signed)
Patient ambulated 70 feet with stand by assist using walker and brace. Tolerated well. He is now sitting up in the chair eating breakfast, call bell at his side.

## 2016-07-30 NOTE — Progress Notes (Signed)
Physical Therapy Treatment Patient Details Name: Bryan Hays MRN: [id number] DOB: August 30, 1945 Today's Date: 07/30/2016    History of Present Illness This 70 y.o. male admitted for L5-S1 PLIF.  PMH noncontributory    PT Comments    Pt progressing towards physical therapy goals. Was able to perform transfers and ambulation with gross modified independence to supervision for safety. Pt anticipates d/c home today. He and wife were educated in walking program, precautions, car transfer, and general safety with pets at home. Will continue to follow and progress as able per POC.   Follow Up Recommendations  Outpatient PT     Equipment Recommendations  None recommended by PT    Recommendations for Other Services       Precautions / Restrictions Precautions Precautions: Back Precaution Booklet Issued: No Precaution Comments: reviewed back precautions and use of brace Required Braces or Orthoses: Spinal Brace Spinal Brace: Lumbar corset;Applied in sitting position Restrictions Weight Bearing Restrictions: No    Mobility  Bed Mobility               General bed mobility comments: Pt sitting up in recliner upon PT arrival. Discussed log roll  Transfers Overall transfer level: Needs assistance Equipment used: None Transfers: Sit to/from Stand Sit to Stand: Modified independent (Device/Increase time)         General transfer comment: pt stood safely without RW  Ambulation/Gait Ambulation/Gait assistance: Modified independent (Device/Increase time) Ambulation Distance (Feet): 500 Feet Assistive device: None Gait Pattern/deviations: Step-through pattern;Decreased stride length Gait velocity: WFL Gait velocity interpretation: at or above normal speed for age/gender General Gait Details: VC's for improved posture. No unsteadiness or LOB noted.    Stairs Stairs: Yes   Stair Management: One rail Left;Alternating pattern;Forwards Number of Stairs: 5 General stair  comments: pt comfortable with alternating pattern, no LOB  Wheelchair Mobility    Modified Rankin (Stroke Patients Only)       Balance Overall balance assessment: No apparent balance deficits (not formally assessed)                                  Cognition Arousal/Alertness: Awake/alert Behavior During Therapy: WFL for tasks assessed/performed Overall Cognitive Status: Within Functional Limits for tasks assessed                      Exercises      General Comments        Pertinent Vitals/Pain Pain Assessment: Faces Faces Pain Scale: Hurts a little bit Pain Location: back Pain Descriptors / Indicators: Sore Pain Intervention(s): Limited activity within patient's tolerance;Monitored during session;Repositioned    Home Living                      Prior Function            PT Goals (current goals can now be found in the care plan section) Acute Rehab PT Goals Patient Stated Goal: return to home and golf PT Goal Formulation: With patient Time For Goal Achievement: 08/05/16 Potential to Achieve Goals: Good Progress towards PT goals: Progressing toward goals    Frequency    Min 5X/week      PT Plan Discharge plan needs to be updated    Co-evaluation             End of Session Equipment Utilized During Treatment: Gait belt;Back brace Activity Tolerance: Patient tolerated treatment well Patient  left: in bed;with call bell/phone within reach     Time: 0956-1014 PT Time Calculation (min) (ACUTE ONLY): 18 min  Charges:  $Gait Training: 8-22 mins                    G Codes:      Thelma Comp 08-04-2016, 3:06 PM  Rolinda Roan, PT, DPT Acute Rehabilitation Services Pager: [phone]

## 2016-07-30 NOTE — Care Management Note (Signed)
Case Management Note  Patient Details  Name: Bryan Hays MRN: [id number] Date of Birth: October 24, 1945  Subjective/Objective:                    Action/Plan: Pt discharging home with recommendations for [facility] PT. No orders per MD and patient refusing HH at this time. No recommendations for DME. Bedside RN updated.   Expected Discharge Date:                  Expected Discharge Plan:  Home/Self Care  In-House Referral:     Discharge planning Services     Post Acute Care Choice:    Choice offered to:     DME Arranged:    DME Agency:     HH Arranged:    [place] Agency:     Status of Service:  Completed, signed off  If discussed at [employer] of Stay Meetings, dates discussed:    Additional Comments:  Pollie Friar, RN 07/30/2016, 11:04 AM

## 2016-07-30 NOTE — Progress Notes (Signed)
RN discussed discharge information with patient and his wife including f/u appointment with Dr. Ellene Route, he states he already has this scheduled, discussed prescriptions, verbalized understanding of pain control, states Dr. Ellene Route told him to remove dressing and he may shower. IV removed. Patient states pain is very minimal at this time, about a 3/10.

## 2016-07-30 NOTE — Discharge Summary (Signed)
Physician Discharge Summary  Patient ID: TAJIRI SEKEL MRN: [id number] DOB/AGE: [birth year] 69 y.o.  Admit date: 07/28/2016 Discharge date: 07/30/2016  Admission Diagnoses:Spondylolisthesis L5-S1 with L5 radiculopathy, lumbar stenosis  Discharge Diagnoses: Spondylolisthesis L5-S1 with L5 radiculopathy, lumbar stenosis Active Problems:   Spondylolisthesis at L5-S1 level   Discharged Condition: good  Hospital Course: Patient was admitted to undergo surgical decompression and stabilization at L5-S1. He tolerated surgery well.  Consults: None  Significant Diagnostic Studies: None  Treatments: surgery: Laminectomy L5 with decompression of L5 and S1 nerve roots posterior lumbar interbody arthrodesis with peek spacers local autograft and infuse. Discharge Exam: Blood pressure (!) 104/57, pulse 78, temperature 97.7 F (36.5 C), temperature source Oral, resp. rate 20, height 5\' 6"  (1.676 m), weight 102.7 kg (226 lb 6.4 oz), SpO2 98 %. Incision is clean and dry, motor function is intact. Station and gait are intact.  Disposition: Discharge home  Discharge Instructions    Call MD for:  redness, tenderness, or signs of infection (pain, swelling, redness, odor or green/yellow discharge around incision site)    Complete by:  As directed    Call MD for:  severe uncontrolled pain    Complete by:  As directed    Call MD for:  temperature >100.4    Complete by:  As directed    Diet - low sodium heart healthy    Complete by:  As directed    Discharge instructions    Complete by:  As directed    Okay to shower. Do not apply salves or appointments to incision. No heavy lifting with the upper extremities greater than 15 pounds. May resume driving when not requiring pain medication and patient feels comfortable with doing so.   Increase activity slowly    Complete by:  As directed        Medication List    TAKE these medications   aspirin 81 MG EC tablet Take 81 mg by mouth daily.    cetirizine 10 MG tablet Commonly known as:  ZYRTEC Take 10 mg by mouth daily as needed for allergies.   dexamethasone 1 MG tablet Commonly known as:  DECADRON 2 tablets twice daily for 2 days, one tablet twice daily for 2 days, one tablet daily for 2 days.   diazepam 5 MG tablet Commonly known as:  VALIUM Take 1 tablet (5 mg total) by mouth every 6 (six) hours as needed for muscle spasms.   esomeprazole 20 MG capsule Commonly known as:  NEXIUM Take 20 mg by mouth daily at 12 noon.   lisinopril 5 MG tablet Commonly known as:  PRINIVIL,ZESTRIL Take 5 mg by mouth daily.   naproxen sodium 220 MG tablet Commonly known as:  ANAPROX Take 220 mg by mouth 2 (two) times daily as needed (for back pain.).   oxyCODONE-acetaminophen 5-325 MG tablet Commonly known as:  PERCOCET/ROXICET Take 1-2 tablets by mouth every 4 (four) hours as needed for moderate pain.   rosuvastatin 5 MG tablet Commonly known as:  CRESTOR TAKE 1 TABLET BY MOUTH EVERY DAY        Signed: Earleen Newport 07/30/2016, 9:29 AM

## 2016-09-23 DIAGNOSIS — M25561 Pain in right knee: Secondary | ICD-10-CM | POA: Diagnosis not present

## 2016-10-28 DIAGNOSIS — M4317 Spondylolisthesis, lumbosacral region: Secondary | ICD-10-CM | POA: Diagnosis not present

## 2016-11-03 ENCOUNTER — Ambulatory Visit: Payer: Medicare HMO | Attending: Neurological Surgery | Admitting: Physical Therapy

## 2016-11-03 DIAGNOSIS — M545 Low back pain: Secondary | ICD-10-CM | POA: Diagnosis not present

## 2016-11-03 DIAGNOSIS — R29898 Other symptoms and signs involving the musculoskeletal system: Secondary | ICD-10-CM | POA: Diagnosis present

## 2016-11-03 NOTE — Patient Instructions (Signed)
Hamstring Step 2    Left foot relaxed, knee straight, other leg bent, foot flat. Raise straight leg further upward to maximal range. Hold _30__ seconds. Relax leg completely down. Repeat _3__ times.   Straight Leg Raise    Bend one leg. Raise other leg __6__ inches with knee locked. Exhale and tighten thigh muscles while raising leg. Repeat with other leg. Repeat __15__ times. Do _2___ sessions per day.    Abduction    Lift leg up toward ceiling. Return. Use __0__ lbs on ankle. Repeat _15___ times each leg. Do __2__ sessions per day.   Sleeping on Back  Place pillow under knees. A pillow with cervical support and a roll around waist are also helpful. Copyright  VHI. All rights reserved.  Sleeping on Side Place pillow between knees. Use cervical support under neck and a roll around waist as needed. Copyright  VHI. All rights reserved.   Sleeping on Stomach   If this is the only desirable sleeping position, place pillow under lower legs, and under stomach or chest as needed.  Posture - Sitting   Sit upright, head facing forward. Try using a roll to support lower back. Keep shoulders relaxed, and avoid rounded back. Keep hips level with knees. Avoid crossing legs for long periods. Stand to Sit / Sit to Stand   To sit: Bend knees to lower self onto front edge of chair, then scoot back on seat. To stand: Reverse sequence by placing one foot forward, and scoot to front of seat. Use rocking motion to stand up.   Work Height and Reach  Ideal work height is no more than 2 to 4 inches below elbow level when standing, and at elbow level when sitting. Reaching should be limited to arm's length, with elbows slightly bent.  Bending  Bend at hips and knees, not back. Keep feet shoulder-width apart.    Posture - Standing   Good posture is important. Avoid slouching and forward head thrust. Maintain curve in low back and align ears over shoul- ders, hips over  ankles.  Alternating Positions   Alternate tasks and change positions frequently to reduce fatigue and muscle tension. Take rest breaks. Computer Work   Position work to Programmer, multimedia. Use proper work and seat height. Keep shoulders back and down, wrists straight, and elbows at right angles. Use chair that provides full back support. Add footrest and lumbar roll as needed.  Getting Into / Out of Car  Lower self onto seat, scoot back, then bring in one leg at a time. Reverse sequence to get out.  Dressing  Lie on back to pull socks or slacks over feet, or sit and bend leg while keeping back straight.    Housework - Sink  Place one foot on ledge of cabinet under sink when standing at sink for prolonged periods.   Pushing / Pulling  Pushing is preferable to pulling. Keep back in proper alignment, and use leg muscles to do the work.  Deep Squat   Squat and lift with both arms held against upper trunk. Tighten stomach muscles without holding breath. Use smooth movements to avoid jerking.  Avoid Twisting   Avoid twisting or bending back. Pivot around using foot movements, and bend at knees if needed when reaching for articles.  Carrying Luggage   Distribute weight evenly on both sides. Use a cart whenever possible. Do not twist trunk. Move body as a unit.   Lifting Principles .Maintain proper posture and head alignment. .Slide object  as close as possible before lifting. .Move obstacles out of the way. .Test before lifting; ask for help if too heavy. .Tighten stomach muscles without holding breath. .Use smooth movements; do not jerk. .Use legs to do the work, and pivot with feet. .Distribute the work load symmetrically and close to the center of trunk. .Push instead of pull whenever possible.   Ask For Help   Ask for help and delegate to others when possible. Coordinate your movements when lifting together, and maintain the low back curve.  Log Roll   Lying on  back, bend left knee and place left arm across chest. Roll all in one movement to the right. Reverse to roll to the left. Always move as one unit. Housework - Sweeping  Use long-handled equipment to avoid stooping.   Housework - Wiping  Position yourself as close as possible to reach work surface. Avoid straining your back.  Laundry - Unloading Wash   To unload small items at bottom of washer, lift leg opposite to arm being used to reach.  [place] close to area to be raked. Use arm movements to do the work. Keep back straight and avoid twisting.     Cart  When reaching into cart with one arm, lift opposite leg to keep back straight.   Getting Into / Out of Bed  Lower self to lie down on one side by raising legs and lowering head at the same time. Use arms to assist moving without twisting. Bend both knees to roll onto back if desired. To sit up, start from lying on side, and use same move-ments in reverse. Housework - Vacuuming  Hold the vacuum with arm held at side. Step back and forth to move it, keeping head up. Avoid twisting.   Laundry - IT consultant so that bending and twisting can be avoided.   Laundry - Unloading Dryer  Squat down to reach into clothes dryer or use a reacher.  Gardening - Weeding / Probation officer or Kneel. Knee pads may be helpful.

## 2016-11-03 NOTE — Therapy (Signed)
[place], [place], [address] Phone: [phone]   Fax:  [phone]  Physical Therapy Evaluation  Patient Details  Name: Bryan Hays MRN: [id number] Date of Birth: 12-Jun-1946 Referring Provider: Dr. Ellene Route  Encounter Date: 11/03/2016      PT End of Session - 11/03/16 1253    Visit Number 1   Date for PT Re-Evaluation 12/29/16   Authorization Type Medicare   PT Start Time 1018   PT Stop Time 1104   PT Time Calculation (min) 46 min   Activity Tolerance Patient tolerated treatment well   Behavior During Therapy [facility] for tasks assessed/performed      Past Medical History:  Diagnosis Date  . Allergic rhinitis due to pollen   . Degeneration of lumbar or lumbosacral intervertebral disc   . GERD (gastroesophageal reflux disease)   . Hyperlipidemia   . Personal history of urinary calculi   . Tendinitis     Past Surgical History:  Procedure Laterality Date  . FOOT SURGERY     Hx plantar fasciitis  . SHOULDER ARTHROSCOPY  2012   right shoulder   . TONSILLECTOMY      There were no vitals filed for this visit.       Subjective Assessment - 11/03/16 1022    Subjective Patient reporting history of back pain. Saw chiro for approx 25 years. Reports L5 vertebrea "slipped out of place." Had L5-S1 fusion on 07/28/17. Wore brace for approx 2 months. Feeling well since fusion. Does have some numbness and tingling into B LE - feels like it is slowly getting better. Walks dog multiple times a day. Feels like flexibility is his greatest deficit. Currently takes hot shower and uses biofreeze for relief.    Currently in Pain? No/denies   Pain Score 0-No pain   Pain Location Back   Pain Orientation Right;Left;Lower   Pain Radiating Towards L LE - lateral thigh to knee, slight numbness of R foot   Pain Relieving Factors hot shower, biofreeze   Multiple Pain Sites No            [facility] PT Assessment  - 11/03/16 1035      Assessment   Medical Diagnosis Spondylolisthesis, lumboscaral region   Referring Provider Dr. Ellene Route   Onset Date/Surgical Date 07/28/16   Next MD Visit 12/09/16   Prior Therapy no     Precautions   Precautions Back   Precaution Comments no bending, lifting, twisting     Restrictions   Weight Bearing Restrictions No     Balance Screen   Has the patient fallen in the past 6 months No   Has the patient had a decrease in activity level because of a fear of falling?  No   Is the patient reluctant to leave their home because of a fear of falling?  No     Home Ecologist residence   Living Arrangements Spouse/significant other     Prior Function   Level of [place] Retired   Leisure golf, walking Orthoptist   Overall Cognitive Status Within Functional Limits for tasks assessed     Observation/Other Assessments   Focus on Therapeutic Outcomes (FOTO)  Lumbar Spine: 58 (42% limited, predicted 36% limited)     Coordination   Gross Motor Movements are Fluid and Coordinated Yes     ROM / Strength   AROM /  PROM / Strength AROM;Strength     AROM   Overall AROM  Within functional limits for tasks performed   Overall AROM Comments did not formally assess due to recent fusion with restriction of bending, lifting, and twisting, however, functional for all task performed in PT     Strength   Strength Assessment Site Hip;Knee;Ankle   Right/Left Hip Right;Left   Right Hip Flexion 4-/5   Right Hip Extension 3+/5   Right Hip ABduction 4-/5   Left Hip Flexion 4/5   Left Hip Extension 3+/5   Left Hip ABduction 4-/5   Right/Left Knee Right;Left   Right Knee Flexion 4-/5   Right Knee Extension 4/5   Left Knee Flexion 4-/5   Left Knee Extension 4/5   Right/Left Ankle Right;Left   Right Ankle Dorsiflexion 4-/5   Left Ankle Dorsiflexion 4-/5     Flexibility   Soft Tissue Assessment /Muscle Length  yes   Hamstrings B tightness   Quadriceps B tightness     Transfers   Comments reviewed log rolling technique                            PT Education - 11/03/16 1253    Education provided Yes   Education Details exam findings, POC, HEP   Person(s) Educated Patient   Methods Explanation;Demonstration;Handout   Comprehension Verbalized understanding;Returned demonstration             PT Long Term Goals - 11/03/16 1300      PT LONG TERM GOAL #1   Title Patient to be independent with advanced HEP (12/29/16)   Status New     PT LONG TERM GOAL #2   Title Patient to demonstrate good body mechanics with ability to self-correct (12/29/16)   Status New     PT LONG TERM GOAL #3   Title Patient to improve B LE strength by >/= 1 MMT grade (12/29/16)   Status New     PT LONG TERM GOAL #4   Title Patient to report ability to perform household tasks and ADLs with no back pain or limitations (12/29/16)   Status New               Plan - 11/03/16 1454    Clinical Impression Statement Patient is a 71 y/o male presenting to [place] today s/p L5-S1 fusion on 07/28/16 due to spondylolisthesis affecting patients functional mobility. Patient today with tightness of both posterior and anterior B LE, reduced strength, as well as reduced functional mobility, in part due to back restrictions (bending, lifting and twisting) floowing fusion. Patient to benefit from PT to address the above listed deficits to allow for improved functional mobility.    Rehab Potential Good   PT Frequency 2x / week   PT Duration 8 weeks   PT Treatment/Interventions ADLs/Self Care Home Management;Cryotherapy;Electrical Stimulation;Moist Heat;Ultrasound;Neuromuscular re-education;Balance training;Therapeutic exercise;Therapeutic activities;Functional mobility training;Stair training;Gait training;Patient/family education;Manual techniques;Passive range of motion;Vasopneumatic Device;Taping;Dry needling    Consulted and Agree with Plan of Care Patient      Patient will benefit from skilled therapeutic intervention in order to improve the following deficits and impairments:  Decreased activity tolerance, Decreased mobility, Decreased strength, Pain  Visit Diagnosis: Acute low back pain, unspecified back pain laterality, with sciatica presence unspecified - Plan: PT plan of care cert/re-cert  Other symptoms and signs involving the musculoskeletal system - Plan: PT plan of care cert/re-cert      G-Codes - 48/18/56  1257    Functional Assessment Tool Used (Outpatient Only) FOTO: 58 (42% limited)   Functional Limitation Mobility: Walking and moving around   Mobility: Walking and Moving Around Current Status [id number]) At least 40 percent but less than 60 percent impaired, limited or restricted   Mobility: Walking and Moving Around Goal Status [phone]) At least 20 percent but less than 40 percent impaired, limited or restricted       Problem List Patient Active Problem List   Diagnosis Date Noted  . Spondylolisthesis at L5-S1 level 07/28/2016  . Elevated blood pressure 04/27/2014  . Decreased sex drive 35/70/1779  . Routine health maintenance 02/21/2011  . HYPERLIPIDEMIA 11/12/2007  . ALLERGIC RHINITIS, SEASONAL 11/12/2007  . GASTROESOPHAGEAL REFLUX DISEASE 11/12/2007  . [place] DISEASE, LUMBAR 11/12/2007  . NEPHROLITHIASIS, HX OF 11/12/2007    Lanney Gins, PT, DPT 11/03/16 2:58 PM   [place], [place], [address] Phone: [phone]   Fax:  [phone]  Name: Bryan Hays MRN: [id number] Date of Birth: August 10, 1946

## 2016-11-09 ENCOUNTER — Encounter: Payer: Self-pay | Admitting: Physical Therapy

## 2016-11-09 ENCOUNTER — Ambulatory Visit: Payer: Medicare HMO | Admitting: Physical Therapy

## 2016-11-09 DIAGNOSIS — M545 Low back pain: Secondary | ICD-10-CM | POA: Diagnosis not present

## 2016-11-09 DIAGNOSIS — R29898 Other symptoms and signs involving the musculoskeletal system: Secondary | ICD-10-CM

## 2016-11-09 NOTE — Therapy (Signed)
[place], [place], [address] Phone: [phone]   Fax:  [phone]  Physical Therapy Treatment  Patient Details  Name: Bryan Hays MRN: [id number] Date of Birth: Mar 05, 1946 Referring Provider: Dr. Ellene Route  Encounter Date: 11/09/2016      PT End of Session - 11/09/16 0917    Visit Number 2   Date for PT Re-Evaluation 12/29/16   Authorization Type Medicare   PT Start Time 0845   PT Stop Time 0920   PT Time Calculation (min) 35 min   Activity Tolerance Patient tolerated treatment well   Behavior During Therapy [facility] for tasks assessed/performed      Past Medical History:  Diagnosis Date  . Allergic rhinitis due to pollen   . Degeneration of lumbar or lumbosacral intervertebral disc   . GERD (gastroesophageal reflux disease)   . Hyperlipidemia   . Personal history of urinary calculi   . Tendinitis     Past Surgical History:  Procedure Laterality Date  . FOOT SURGERY     Hx plantar fasciitis  . SHOULDER ARTHROSCOPY  2012   right shoulder   . TONSILLECTOMY      There were no vitals filed for this visit.      Subjective Assessment - 11/09/16 0845    Subjective Pt reports no change since evaluation   Currently in Pain? No/denies   Pain Score 0-No pain                         [facility] Adult PT Treatment/Exercise - 11/09/16 0001      Exercises   Exercises Lumbar     Lumbar Exercises: Aerobic   Elliptical NuStep L4 x6 min      Lumbar Exercises: Machines for Strengthening   Cybex Knee Extension 10lb 2x10   Cybex Knee Flexion 25lb 2x10   Leg Press 30lb 2x10   Other Lumbar Machine Exercise Lats and Rows 25lb 2x10     Lumbar Exercises: Standing   Other Standing Lumbar Exercises Standing overhead ext red ball 2x10      Lumbar Exercises: Seated   Sit to Stand 10 reps  x2, with forward press with yellow ball                     PT Long Term Goals  - 11/09/16 0919      PT LONG TERM GOAL #1   Title Patient to be independent with advanced HEP (12/29/16)   Status On-going     PT LONG TERM GOAL #2   Title Patient to demonstrate good body mechanics with ability to self-correct (12/29/16)   Status On-going     PT LONG TERM GOAL #3   Title Patient to improve B LE strength by >/= 1 MMT grade (12/29/16)   Status On-going     PT LONG TERM GOAL #4   Title Patient to report ability to perform household tasks and ADLs with no back pain or limitations (12/29/16)   Status On-going               Plan - 11/09/16 5638    Clinical Impression Statement Pt tolerated an initial progression to exercises well. Pt reports no pain or tightness. Compliant with HEP   Rehab Potential Good   PT Frequency 2x / week   PT Duration 8 weeks   PT Treatment/Interventions ADLs/Self Care Home Management;Cryotherapy;Electrical Stimulation;Moist Heat;Ultrasound;Neuromuscular re-education;Balance training;Therapeutic exercise;Therapeutic  activities;Functional mobility training;Stair training;Gait training;Patient/family education;Manual techniques;Passive range of motion;Vasopneumatic Device;Taping;Dry needling   PT Next Visit Plan overall strenght and flexability      Patient will benefit from skilled therapeutic intervention in order to improve the following deficits and impairments:  Decreased activity tolerance, Decreased mobility, Decreased strength, Pain  Visit Diagnosis: Acute low back pain, unspecified back pain laterality, with sciatica presence unspecified  Other symptoms and signs involving the musculoskeletal system     Problem List Patient Active Problem List   Diagnosis Date Noted  . Spondylolisthesis at L5-S1 level 07/28/2016  . Elevated blood pressure 04/27/2014  . Decreased sex drive 26/37/8588  . Routine health maintenance 02/21/2011  . HYPERLIPIDEMIA 11/12/2007  . ALLERGIC RHINITIS, SEASONAL 11/12/2007  . GASTROESOPHAGEAL REFLUX  DISEASE 11/12/2007  . [place] DISEASE, LUMBAR 11/12/2007  . NEPHROLITHIASIS, HX OF 11/12/2007    Scot Jun 11/09/2016, 9:20 AM  [place], [place], [address] Phone: [phone]   Fax:  [phone]  Name: Bryan Hays MRN: [id number] Date of Birth: May 15, 1946

## 2016-11-13 ENCOUNTER — Ambulatory Visit: Payer: Medicare HMO | Admitting: Physical Therapy

## 2016-11-13 ENCOUNTER — Encounter: Payer: Self-pay | Admitting: Physical Therapy

## 2016-11-13 DIAGNOSIS — M545 Low back pain: Secondary | ICD-10-CM

## 2016-11-13 DIAGNOSIS — R29898 Other symptoms and signs involving the musculoskeletal system: Secondary | ICD-10-CM

## 2016-11-13 NOTE — Therapy (Signed)
[place], [place], [address] Phone: [phone]   Fax:  [phone]  Physical Therapy Treatment  Patient Details  Name: Bryan Hays MRN: [id number] Date of Birth: 1945-09-28 Referring Provider: Dr. Ellene Route  Encounter Date: 11/13/2016      PT End of Session - 11/13/16 0926    Visit Number 3   Date for PT Re-Evaluation 12/29/16   Authorization Type Medicare   PT Start Time 0845   PT Stop Time 0927   PT Time Calculation (min) 42 min   Activity Tolerance Patient tolerated treatment well   Behavior During Therapy [facility] for tasks assessed/performed      Past Medical History:  Diagnosis Date  . Allergic rhinitis due to pollen   . Degeneration of lumbar or lumbosacral intervertebral disc   . GERD (gastroesophageal reflux disease)   . Hyperlipidemia   . Personal history of urinary calculi   . Tendinitis     Past Surgical History:  Procedure Laterality Date  . FOOT SURGERY     Hx plantar fasciitis  . SHOULDER ARTHROSCOPY  2012   right shoulder   . TONSILLECTOMY      There were no vitals filed for this visit.      Subjective Assessment - 11/13/16 0844    Subjective Pt reports an pain after last session, just stiffness the next day.   Currently in Pain? No/denies   Pain Score 0-No pain                         [facility] Adult PT Treatment/Exercise - 11/13/16 0001      Lumbar Exercises: Aerobic   Elliptical NuStep L4 x6 min      Lumbar Exercises: Machines for Strengthening   Cybex Knee Extension 10lb 2x10   Cybex Knee Flexion 25lb 2x15   Leg Press 30lb 2x15   Other Lumbar Machine Exercise Lats and Rows 25lb 2x15     Lumbar Exercises: Standing   Other Standing Lumbar Exercises Standing straight arm pull downs 35lb 2x15; Standing overhead press red ball 2x10    Other Standing Lumbar Exercises AR press 25lb x10 each     Lumbar Exercises: Seated   Sit to Stand 15 reps   pressing yellow ball forward     Lumbar Exercises: Supine   Bridge 20 reps;2 seconds   Straight Leg Raise 20 reps;2 seconds                     PT Long Term Goals - 11/09/16 0919      PT LONG TERM GOAL #1   Title Patient to be independent with advanced HEP (12/29/16)   Status On-going     PT LONG TERM GOAL #2   Title Patient to demonstrate good body mechanics with ability to self-correct (12/29/16)   Status On-going     PT LONG TERM GOAL #3   Title Patient to improve B LE strength by >/= 1 MMT grade (12/29/16)   Status On-going     PT LONG TERM GOAL #4   Title Patient to report ability to perform household tasks and ADLs with no back pain or limitations (12/29/16)   Status On-going               Plan - 11/13/16 7035    Clinical Impression Statement pt able to complete all of today's interventions without reports of increase pain.  Pt  demos good strength and ROM. Pt does require cues for pacing and to slow down with exercises. Pt reports that he is edgar to return to play golf. Informed pt that he has to allow his body to heal first.   Rehab Potential Good   PT Frequency 2x / week   PT Duration 8 weeks   PT Treatment/Interventions ADLs/Self Care Home Management;Cryotherapy;Electrical Stimulation;Moist Heat;Ultrasound;Neuromuscular re-education;Balance training;Therapeutic exercise;Therapeutic activities;Functional mobility training;Stair training;Gait training;Patient/family education;Manual techniques;Passive range of motion;Vasopneumatic Device;Taping;Dry needling   PT Next Visit Plan overall strength and flex ability      Patient will benefit from skilled therapeutic intervention in order to improve the following deficits and impairments:  Decreased activity tolerance, Decreased mobility, Decreased strength, Pain  Visit Diagnosis: Other symptoms and signs involving the musculoskeletal system  Acute low back pain, unspecified back pain laterality, with  sciatica presence unspecified     Problem List Patient Active Problem List   Diagnosis Date Noted  . Spondylolisthesis at L5-S1 level 07/28/2016  . Elevated blood pressure 04/27/2014  . Decreased sex drive 55/97/4163  . Routine health maintenance 02/21/2011  . HYPERLIPIDEMIA 11/12/2007  . ALLERGIC RHINITIS, SEASONAL 11/12/2007  . GASTROESOPHAGEAL REFLUX DISEASE 11/12/2007  . [place] DISEASE, LUMBAR 11/12/2007  . NEPHROLITHIASIS, HX OF 11/12/2007    Scot Jun, PTA 11/13/2016, 9:29 AM  [place], [place], [address] Phone: [phone]   Fax:  [phone]  Name: Bryan Hays MRN: [id number] Date of Birth: 06/14/1946

## 2016-11-16 ENCOUNTER — Encounter: Payer: Self-pay | Admitting: Physical Therapy

## 2016-11-16 ENCOUNTER — Ambulatory Visit: Payer: Medicare HMO | Admitting: Physical Therapy

## 2016-11-16 DIAGNOSIS — R29898 Other symptoms and signs involving the musculoskeletal system: Secondary | ICD-10-CM

## 2016-11-16 DIAGNOSIS — M545 Low back pain: Secondary | ICD-10-CM

## 2016-11-16 NOTE — Therapy (Signed)
[place], [place], [address] Phone: [phone]   Fax:  [phone]  Physical Therapy Treatment  Patient Details  Name: Bryan Hays MRN: [id number] Date of Birth: 1946-02-01 Referring Provider: Dr. Ellene Route  Encounter Date: 11/16/2016      PT End of Session - 11/16/16 0923    Visit Number 4   Date for PT Re-Evaluation 12/29/16   PT Start Time 0845   PT Stop Time 0928   PT Time Calculation (min) 43 min      Past Medical History:  Diagnosis Date  . Allergic rhinitis due to pollen   . Degeneration of lumbar or lumbosacral intervertebral disc   . GERD (gastroesophageal reflux disease)   . Hyperlipidemia   . Personal history of urinary calculi   . Tendinitis     Past Surgical History:  Procedure Laterality Date  . FOOT SURGERY     Hx plantar fasciitis  . SHOULDER ARTHROSCOPY  2012   right shoulder   . TONSILLECTOMY      There were no vitals filed for this visit.      Subjective Assessment - 11/16/16 0842    Subjective "Just stiff as usual every morning"   Currently in Pain? No/denies   Pain Score 0-No pain                         [facility] Adult PT Treatment/Exercise - 11/16/16 0001      Lumbar Exercises: Aerobic   Elliptical I 10 R 5 65fd/2rev     Lumbar Exercises: Machines for Strengthening   Cybex Knee Extension 10lb 2x15   Cybex Knee Flexion 25lb 2x15   Leg Press 40lb 3x10   Other Lumbar Machine Exercise Lats and Rows 35lb 2x10   Other Lumbar Machine Exercise back ext black tband 2x10     Lumbar Exercises: Standing   Other Standing Lumbar Exercises Standing straight arm pull downs 35lb 2x15; Standing overhead press red ball 2x10    Other Standing Lumbar Exercises AR press 25lb x10 each                     PT Long Term Goals - 11/16/16 0915      PT LONG TERM GOAL #2   Title Patient to demonstrate good body mechanics with ability to  self-correct (12/29/16)   Status Achieved     PT LONG TERM GOAL #3   Title Patient to improve B LE strength by >/= 1 MMT grade (12/29/16)   Status On-going     PT LONG TERM GOAL #4   Title Patient to report ability to perform household tasks and ADLs with no back pain or limitations (12/29/16)   Status Achieved               Plan - 11/16/16 0923    Clinical Impression Statement Pt continues to do well with all exercises and reports no pain. Pt does fatigue quickly on elliptical. Pt has progressed and met some goals.   Rehab Potential Good   PT Frequency 2x / week   PT Duration 8 weeks   PT Treatment/Interventions ADLs/Self Care Home Management;Cryotherapy;Electrical Stimulation;Moist Heat;Ultrasound;Neuromuscular re-education;Balance training;Therapeutic exercise;Therapeutic activities;Functional mobility training;Stair training;Gait training;Patient/family education;Manual techniques;Passive range of motion;Vasopneumatic Device;Taping;Dry needling   PT Next Visit Plan overall strenght and flexability      Patient will benefit from skilled therapeutic intervention in order to improve the following  deficits and impairments:     Visit Diagnosis: Other symptoms and signs involving the musculoskeletal system  Acute low back pain, unspecified back pain laterality, with sciatica presence unspecified     Problem List Patient Active Problem List   Diagnosis Date Noted  . Spondylolisthesis at L5-S1 level 07/28/2016  . Elevated blood pressure 04/27/2014  . Decreased sex drive 68/06/5725  . Routine health maintenance 02/21/2011  . HYPERLIPIDEMIA 11/12/2007  . ALLERGIC RHINITIS, SEASONAL 11/12/2007  . GASTROESOPHAGEAL REFLUX DISEASE 11/12/2007  . [place] DISEASE, LUMBAR 11/12/2007  . NEPHROLITHIASIS, HX OF 11/12/2007    Scot Jun 11/16/2016, 9:26 AM  [place], [place], [address] Phone:  [phone]   Fax:  [phone]  Name: Bryan Hays MRN: [id number] Date of Birth: 1946-03-24

## 2016-11-18 ENCOUNTER — Ambulatory Visit: Payer: Medicare HMO | Admitting: Physical Therapy

## 2016-11-18 ENCOUNTER — Encounter: Payer: Self-pay | Admitting: Physical Therapy

## 2016-11-18 DIAGNOSIS — R29898 Other symptoms and signs involving the musculoskeletal system: Secondary | ICD-10-CM

## 2016-11-18 DIAGNOSIS — M545 Low back pain: Secondary | ICD-10-CM

## 2016-11-18 NOTE — Therapy (Signed)
[place], [place], [address] Phone: [phone]   Fax:  [phone]  Physical Therapy Treatment  Patient Details  Name: Bryan Hays MRN: [id number] Date of Birth: 1946-08-13 Referring Provider: Dr. Ellene Route  Encounter Date: 11/18/2016      PT End of Session - 11/18/16 0924    Visit Number 5   Date for PT Re-Evaluation 12/29/16   PT Start Time 0845   PT Stop Time 0925   PT Time Calculation (min) 40 min   Activity Tolerance Patient tolerated treatment well   Behavior During Therapy [facility] for tasks assessed/performed      Past Medical History:  Diagnosis Date  . Allergic rhinitis due to pollen   . Degeneration of lumbar or lumbosacral intervertebral disc   . GERD (gastroesophageal reflux disease)   . Hyperlipidemia   . Personal history of urinary calculi   . Tendinitis     Past Surgical History:  Procedure Laterality Date  . FOOT SURGERY     Hx plantar fasciitis  . SHOULDER ARTHROSCOPY  2012   right shoulder   . TONSILLECTOMY      There were no vitals filed for this visit.      Subjective Assessment - 11/18/16 0844    Subjective "Pt reports soreness in R quad from last treatment."   Currently in Pain? No/denies   Pain Score 0-No pain                         [facility] Adult PT Treatment/Exercise - 11/18/16 0001      Lumbar Exercises: Stretches   Passive Hamstring Stretch 4 reps;10 seconds   Single Knee to Chest Stretch 10 seconds;1 rep     Lumbar Exercises: Aerobic   Elliptical NuStep L6 x 7 min     Lumbar Exercises: Machines for Strengthening   Cybex Knee Extension 10lb 2x15   Cybex Knee Flexion 25lb 2x15   Leg Press 40lb 3x10   Other Lumbar Machine Exercise Lats and Rows 35lb 2x15   Other Lumbar Machine Exercise back ext black tband 2x10     Lumbar Exercises: Supine   Bridge 20 reps;2 seconds                     PT Long Term Goals -  11/16/16 0915      PT LONG TERM GOAL #2   Title Patient to demonstrate good body mechanics with ability to self-correct (12/29/16)   Status Achieved     PT LONG TERM GOAL #3   Title Patient to improve B LE strength by >/= 1 MMT grade (12/29/16)   Status On-going     PT LONG TERM GOAL #4   Title Patient to report ability to perform household tasks and ADLs with no back pain or limitations (12/29/16)   Status Achieved               Plan - 11/18/16 0924    Clinical Impression Statement Pt able to complete all of today's exercises well, he reports no increase in pain. Pt does reports some L quad soreness from last treatment   Rehab Potential Good   PT Frequency 2x / week   PT Duration 8 weeks   PT Treatment/Interventions ADLs/Self Care Home Management;Cryotherapy;Electrical Stimulation;Moist Heat;Ultrasound;Neuromuscular re-education;Balance training;Therapeutic exercise;Therapeutic activities;Functional mobility training;Stair training;Gait training;Patient/family education;Manual techniques;Passive range of motion;Vasopneumatic Device;Taping;Dry needling   PT Next Visit Plan overall strenght  and flexability      Patient will benefit from skilled therapeutic intervention in order to improve the following deficits and impairments:  Decreased activity tolerance, Decreased mobility, Decreased strength, Pain  Visit Diagnosis: Other symptoms and signs involving the musculoskeletal system  Acute low back pain, unspecified back pain laterality, with sciatica presence unspecified     Problem List Patient Active Problem List   Diagnosis Date Noted  . Spondylolisthesis at L5-S1 level 07/28/2016  . Elevated blood pressure 04/27/2014  . Decreased sex drive 40/35/2481  . Routine health maintenance 02/21/2011  . HYPERLIPIDEMIA 11/12/2007  . ALLERGIC RHINITIS, SEASONAL 11/12/2007  . GASTROESOPHAGEAL REFLUX DISEASE 11/12/2007  . [place] DISEASE, LUMBAR 11/12/2007  . NEPHROLITHIASIS, HX OF  11/12/2007    Scot Jun 11/18/2016, 9:25 AM  [place], [place], [address] Phone: [phone]   Fax:  [phone]  Name: Bryan Hays MRN: [id number] Date of Birth: [birth year]

## 2016-11-25 ENCOUNTER — Ambulatory Visit: Payer: Medicare HMO | Attending: Neurological Surgery | Admitting: Physical Therapy

## 2016-11-25 ENCOUNTER — Encounter: Payer: Self-pay | Admitting: Physical Therapy

## 2016-11-25 DIAGNOSIS — M545 Low back pain: Secondary | ICD-10-CM | POA: Insufficient documentation

## 2016-11-25 DIAGNOSIS — R29898 Other symptoms and signs involving the musculoskeletal system: Secondary | ICD-10-CM | POA: Insufficient documentation

## 2016-11-25 NOTE — Therapy (Signed)
[place], [place], [address] Phone: [phone]   Fax:  [phone]  Physical Therapy Treatment  Patient Details  Name: Bryan Hays MRN: [id number] Date of Birth: 25-Apr-1946 Referring Provider: Dr. Ellene Route  Encounter Date: 11/25/2016      PT End of Session - 11/25/16 0929    Visit Number 6   Date for PT Re-Evaluation 12/29/16   Authorization Type Medicare   PT Start Time 0845   PT Stop Time 0928   PT Time Calculation (min) 43 min   Activity Tolerance Patient tolerated treatment well      Past Medical History:  Diagnosis Date  . Allergic rhinitis due to pollen   . Degeneration of lumbar or lumbosacral intervertebral disc   . GERD (gastroesophageal reflux disease)   . Hyperlipidemia   . Personal history of urinary calculi   . Tendinitis     Past Surgical History:  Procedure Laterality Date  . FOOT SURGERY     Hx plantar fasciitis  . SHOULDER ARTHROSCOPY  2012   right shoulder   . TONSILLECTOMY      There were no vitals filed for this visit.      Subjective Assessment - 11/25/16 0841    Subjective "Just a little stiff this morning"   Currently in Pain? No/denies   Pain Score 0-No pain                         [facility] Adult PT Treatment/Exercise - 11/25/16 0001      Lumbar Exercises: Aerobic   Elliptical NuStep L6 x 6 min   UBE (Upper Arm Bike) Elliptical I10 R 5 31fro/2rev     Lumbar Exercises: Machines for Strengthening   Cybex Knee Extension 10lb 2x15   Cybex Knee Flexion 35lb 2x15   Leg Press 50lb 3x10   Other Lumbar Machine Exercise Lats 45lb 2x15     Lumbar Exercises: Standing   Row 10 reps;Power Automotive engineer, rev grip   Row Limitations 35lb   Other Standing Lumbar Exercises Standing straight arm pull downs 35lb 2x15; Standing overhead ext yellow ball 2x10; DL 4lb to 10 in surface   Other Standing Lumbar Exercises AR press 25lb 2x10 each                      PT Long Term Goals - 11/25/16 0930      PT LONG TERM GOAL #1   Title Patient to be independent with advanced HEP (12/29/16)   Status On-going     PT LONG TERM GOAL #2   Title Patient to demonstrate good body mechanics with ability to self-correct (12/29/16)   Status Achieved     PT LONG TERM GOAL #3   Title Patient to improve B LE strength by >/= 1 MMT grade (12/29/16)   Status On-going     PT LONG TERM GOAL #4   Status Achieved               Plan - 11/25/16 0930    Clinical Impression Statement Pt continues to perform well in therapy. Pt expressed that he couldn't wait to play golf. No reports of increase pain with today's activities. Pt tends to move through exercises quickly and needs cues to be redirected.   Rehab Potential Good   PT Frequency 2x / week   PT Duration 8 weeks   PT Treatment/Interventions ADLs/Self Care  Home Management;Cryotherapy;Electrical Stimulation;Moist Heat;Ultrasound;Neuromuscular re-education;Balance training;Therapeutic exercise;Therapeutic activities;Functional mobility training;Stair training;Gait training;Patient/family education;Manual techniques;Passive range of motion;Vasopneumatic Device;Taping;Dry needling   PT Next Visit Plan overall strenght and flexability      Patient will benefit from skilled therapeutic intervention in order to improve the following deficits and impairments:  Decreased activity tolerance, Decreased mobility, Decreased strength, Pain  Visit Diagnosis: Other symptoms and signs involving the musculoskeletal system  Acute low back pain, unspecified back pain laterality, with sciatica presence unspecified     Problem List Patient Active Problem List   Diagnosis Date Noted  . Spondylolisthesis at L5-S1 level 07/28/2016  . Elevated blood pressure 04/27/2014  . Decreased sex drive 56/31/4970  . Routine health maintenance 02/21/2011  . HYPERLIPIDEMIA 11/12/2007  . ALLERGIC RHINITIS,  SEASONAL 11/12/2007  . GASTROESOPHAGEAL REFLUX DISEASE 11/12/2007  . [place] DISEASE, LUMBAR 11/12/2007  . NEPHROLITHIASIS, HX OF 11/12/2007    Scot Jun 11/25/2016, 9:35 AM  [place], [place], [address] Phone: [phone]   Fax:  [phone]  Name: Bryan Hays MRN: [id number] Date of Birth: 16-Dec-1945

## 2016-11-27 ENCOUNTER — Ambulatory Visit: Payer: Medicare HMO | Admitting: Physical Therapy

## 2016-11-27 ENCOUNTER — Encounter: Payer: Self-pay | Admitting: Physical Therapy

## 2016-11-27 DIAGNOSIS — R29898 Other symptoms and signs involving the musculoskeletal system: Secondary | ICD-10-CM | POA: Diagnosis not present

## 2016-11-27 DIAGNOSIS — M545 Low back pain: Secondary | ICD-10-CM

## 2016-11-27 NOTE — Therapy (Signed)
[place], [place], [address] Phone: [phone]   Fax:  [phone]  Physical Therapy Treatment  Patient Details  Name: Bryan Hays MRN: [id number] Date of Birth: January 30, 1946 Referring Provider: Dr. Ellene Route  Encounter Date: 11/27/2016      PT End of Session - 11/27/16 0921    Visit Number 7   Date for PT Re-Evaluation 12/29/16   Authorization Type Medicare   PT Start Time 0845   PT Stop Time 0925   PT Time Calculation (min) 40 min   Activity Tolerance Patient tolerated treatment well   Behavior During Therapy [facility] for tasks assessed/performed      Past Medical History:  Diagnosis Date  . Allergic rhinitis due to pollen   . Degeneration of lumbar or lumbosacral intervertebral disc   . GERD (gastroesophageal reflux disease)   . Hyperlipidemia   . Personal history of urinary calculi   . Tendinitis     Past Surgical History:  Procedure Laterality Date  . FOOT SURGERY     Hx plantar fasciitis  . SHOULDER ARTHROSCOPY  2012   right shoulder   . TONSILLECTOMY      There were no vitals filed for this visit.      Subjective Assessment - 11/27/16 0848    Subjective Pt. reports no pain, just a little stiffness. No issues since last treatment.    Currently in Pain? No/denies   Pain Score 0-No pain                         [facility] Adult PT Treatment/Exercise - 11/27/16 0001      Lumbar Exercises: Aerobic   Elliptical NuStep L6 x 6 min     Lumbar Exercises: Machines for Strengthening   Cybex Knee Extension 10lb 2x15   Cybex Knee Flexion 35lb 2x15   Leg Press 50lb 3x10   Other Lumbar Machine Exercise Lats 45lb 2x15   Other Lumbar Machine Exercise Rows 25lbs 2x10     Lumbar Exercises: Standing   Other Standing Lumbar Exercises Standing overhead extension with yellow ball 3x10     Lumbar Exercises: Supine   Bridge 10 reps   Bridge Limitations 2 sets with blue  theraband (abduction)  with blue theraband   Straight Leg Raise 10 reps   Straight Leg Raises Limitations 2 sets with 3 lbs   Other Supine Lumbar Exercises DLR 2x10                     PT Long Term Goals - 11/25/16 0930      PT LONG TERM GOAL #1   Title Patient to be independent with advanced HEP (12/29/16)   Status On-going     PT LONG TERM GOAL #2   Title Patient to demonstrate good body mechanics with ability to self-correct (12/29/16)   Status Achieved     PT LONG TERM GOAL #3   Title Patient to improve B LE strength by >/= 1 MMT grade (12/29/16)   Status On-going     PT LONG TERM GOAL #4   Status Achieved               Plan - 11/27/16 6962    Clinical Impression Statement Pt. was able to complete all therapuetic exericses. SOB due to stuffiness from allergies. No reports of increased pain during exercise. Cramped in R hamstring after hamstring curls   Rehab Potential  Good   PT Frequency 2x / week   PT Next Visit Plan continue with strenght and muscular endurance      Patient will benefit from skilled therapeutic intervention in order to improve the following deficits and impairments:  Decreased activity tolerance, Decreased mobility, Decreased strength, Pain  Visit Diagnosis: Other symptoms and signs involving the musculoskeletal system  Acute low back pain, unspecified back pain laterality, with sciatica presence unspecified     Problem List Patient Active Problem List   Diagnosis Date Noted  . Spondylolisthesis at L5-S1 level 07/28/2016  . Elevated blood pressure 04/27/2014  . Decreased sex drive 18/33/5825  . Routine health maintenance 02/21/2011  . HYPERLIPIDEMIA 11/12/2007  . ALLERGIC RHINITIS, SEASONAL 11/12/2007  . GASTROESOPHAGEAL REFLUX DISEASE 11/12/2007  . [place] DISEASE, LUMBAR 11/12/2007  . NEPHROLITHIASIS, HX OF 11/12/2007    Fransisca Connors, SPTA 11/27/2016, 9:27 AM  [place], [place], [address] Phone: [phone]   Fax:  [phone]  Name: Bryan Hays MRN: [id number] Date of Birth: [birth year]

## 2016-12-02 ENCOUNTER — Ambulatory Visit: Payer: Medicare HMO | Admitting: Physical Therapy

## 2016-12-02 ENCOUNTER — Encounter: Payer: Self-pay | Admitting: Physical Therapy

## 2016-12-02 DIAGNOSIS — M545 Low back pain: Secondary | ICD-10-CM | POA: Diagnosis not present

## 2016-12-02 DIAGNOSIS — R29898 Other symptoms and signs involving the musculoskeletal system: Secondary | ICD-10-CM | POA: Diagnosis not present

## 2016-12-02 NOTE — Therapy (Signed)
[place], [place], [address] Phone: [phone]   Fax:  [phone]  Physical Therapy Treatment  Patient Details  Name: Bryan Hays MRN: [id number] Date of Birth: 02-01-1946 Referring Provider: Dr. Ellene Route  Encounter Date: 12/02/2016      PT End of Session - 12/02/16 0927    Visit Number 8   Date for PT Re-Evaluation 12/29/16   PT Start Time 0842   PT Stop Time 0922   PT Time Calculation (min) 40 min      Past Medical History:  Diagnosis Date  . Allergic rhinitis due to pollen   . Degeneration of lumbar or lumbosacral intervertebral disc   . GERD (gastroesophageal reflux disease)   . Hyperlipidemia   . Personal history of urinary calculi   . Tendinitis     Past Surgical History:  Procedure Laterality Date  . FOOT SURGERY     Hx plantar fasciitis  . SHOULDER ARTHROSCOPY  2012   right shoulder   . TONSILLECTOMY      There were no vitals filed for this visit.      Subjective Assessment - 12/02/16 0847    Subjective Pt reports no pain, but he was sore after last session.    Currently in Pain? No/denies                         [facility] Adult PT Treatment/Exercise - 12/02/16 0001      Lumbar Exercises: Stretches   Passive Hamstring Stretch 4 reps;30 seconds  Both   Quad Stretch 4 reps;30 seconds  Supine both    Quad Stretch Limitations in prone, pull up to incoperate hip flexors     Lumbar Exercises: Aerobic   Elliptical NuStep L6 x 6 min     Lumbar Exercises: Standing   Other Standing Lumbar Exercises Standing body blade straight in front 3 sets x 10 Standing overhead extension in all directions with yellow ball 3x10   Other Standing Lumbar Exercises Half golf swings 1st set on ground, 2nd set on airex x 10 reps      Lumbar Exercises: Supine   Other Supine Lumbar Exercises Seated Black theraband ext 2 x 10   Other Supine Lumbar Exercises Seated trunk  rotation stretch with contract relax in pain free zone 3 reps both ways.                      PT Long Term Goals - 11/25/16 0930      PT LONG TERM GOAL #1   Title Patient to be independent with advanced HEP (12/29/16)   Status On-going     PT LONG TERM GOAL #2   Title Patient to demonstrate good body mechanics with ability to self-correct (12/29/16)   Status Achieved     PT LONG TERM GOAL #3   Title Patient to improve B LE strength by >/= 1 MMT grade (12/29/16)   Status On-going     PT LONG TERM GOAL #4   Status Achieved               Plan - 12/02/16 0927    Clinical Impression Statement Pt was very tight in all stretches. His tightness is more previlant in his right side. Was able to complete all exercises. He is going to his MD next week to see when he will be able to return to golf and let us  know the limitations to start incoperate more exercises to get more functional with golf movements.    PT Next Visit Plan Continue with strengthening and muscular endurance. Keep incoperating stretching to regain some flexibilty.       Patient will benefit from skilled therapeutic intervention in order to improve the following deficits and impairments:  Decreased activity tolerance, Decreased mobility, Decreased strength, Pain  Visit Diagnosis: Other symptoms and signs involving the musculoskeletal system  Acute low back pain, unspecified back pain laterality, with sciatica presence unspecified     Problem List Patient Active Problem List   Diagnosis Date Noted  . Spondylolisthesis at L5-S1 level 07/28/2016  . Elevated blood pressure 04/27/2014  . Decreased sex drive 72/62/0355  . Routine health maintenance 02/21/2011  . HYPERLIPIDEMIA 11/12/2007  . ALLERGIC RHINITIS, SEASONAL 11/12/2007  . GASTROESOPHAGEAL REFLUX DISEASE 11/12/2007  . [place] DISEASE, LUMBAR 11/12/2007  . NEPHROLITHIASIS, HX OF 11/12/2007    Alan Mulder SPTA  12/02/2016, 9:35 AM  [place], [place], [address] Phone: [phone]   Fax:  [phone]  Name: AKSHAY SPANG MRN: [id number] Date of Birth: 1946/01/01

## 2016-12-04 ENCOUNTER — Ambulatory Visit: Payer: Medicare HMO | Admitting: Physical Therapy

## 2016-12-04 DIAGNOSIS — M545 Low back pain: Secondary | ICD-10-CM | POA: Diagnosis not present

## 2016-12-04 DIAGNOSIS — R29898 Other symptoms and signs involving the musculoskeletal system: Secondary | ICD-10-CM | POA: Diagnosis not present

## 2016-12-04 NOTE — Therapy (Signed)
[place], [place], [address] Phone: [phone]   Fax:  [phone]  Physical Therapy Treatment  Patient Details  Name: Bryan Hays MRN: [id number] Date of Birth: [birth year] Referring Provider: Dr. Ellene Route  Encounter Date: 12/04/2016      PT End of Session - 12/04/16 1019    Visit Number 9   Date for PT Re-Evaluation 12/29/16   Authorization Type Medicare   PT Start Time 3818   PT Stop Time 0933   PT Time Calculation (min) 46 min   Activity Tolerance Patient tolerated treatment well   Behavior During Therapy [facility] for tasks assessed/performed      Past Medical History:  Diagnosis Date   Allergic rhinitis due to pollen    Degeneration of lumbar or lumbosacral intervertebral disc    GERD (gastroesophageal reflux disease)    Hyperlipidemia    Personal history of urinary calculi    Tendinitis     Past Surgical History:  Procedure Laterality Date   FOOT SURGERY     Hx plantar fasciitis   SHOULDER ARTHROSCOPY  2012   right shoulder    TONSILLECTOMY      There were no vitals filed for this visit.      Subjective Assessment - 12/04/16 0849    Subjective No pain.  Eager to return to the golf course.                         [facility] Adult PT Treatment/Exercise - 12/04/16 0001      Lumbar Exercises: Stretches   Passive Hamstring Stretch 4 reps;30 seconds   Single Knee to Chest Stretch 10 seconds;1 rep   Quad Stretch 4 reps;30 seconds   Quad Stretch Limitations in prone, pull up to incoperate hip flexors   Piriformis Stretch 4 reps;30 seconds     Lumbar Exercises: Aerobic   Stationary Bike x6'     Lumbar Exercises: Machines for Strengthening   Cybex Knee Extension 20lb 2x10   Cybex Knee Flexion 35lb 2x15   Leg Press 60lb 2x10   Other Lumbar Machine Exercise rows 35# 2x10     Lumbar Exercises: Supine   Ab Set 10 reps;5 seconds   Other Supine Lumbar  Exercises PPT w/ add sqeeze 5" hold 10   Other Supine Lumbar Exercises PPT w/ march 2x10                     PT Long Term Goals - 12/02/16 2993      PT LONG TERM GOAL #1   Title Patient to be independent with advanced HEP (12/29/16)   Status Partially Met               Plan - 12/04/16 1019    Clinical Impression Statement Most challenged with posterior pelvic tilt/ lumbopelvic stabilization exercises today.  Will need repetitions for this in order to avoid tendency for anterior pelvic tilt and subsequent increase in pressure at L5-S1 vertebra      Patient will benefit from skilled therapeutic intervention in order to improve the following deficits and impairments:     Visit Diagnosis: Other symptoms and signs involving the musculoskeletal system  Acute low back pain, unspecified back pain laterality, with sciatica presence unspecified     Problem List Patient Active Problem List   Diagnosis Date Noted   Spondylolisthesis at L5-S1 level 07/28/2016   Elevated blood pressure 04/27/2014  Decreased sex drive 95/84/4171   Routine health maintenance 02/21/2011   HYPERLIPIDEMIA 11/12/2007   ALLERGIC RHINITIS, SEASONAL 11/12/2007   GASTROESOPHAGEAL REFLUX DISEASE 11/12/2007   [place] DISEASE, LUMBAR 11/12/2007   NEPHROLITHIASIS, HX OF 11/12/2007    Olean Ree, PTA 12/04/2016, 10:22 AM  [place], [place], [address] Phone: [phone]   Fax:  [phone]  Name: BRACK SHADDOCK MRN: [id number] Date of Birth: August 12, 1946

## 2016-12-08 ENCOUNTER — Ambulatory Visit: Payer: Medicare HMO | Admitting: Physical Therapy

## 2016-12-08 ENCOUNTER — Encounter: Payer: Self-pay | Admitting: Physical Therapy

## 2016-12-08 DIAGNOSIS — M545 Low back pain: Secondary | ICD-10-CM | POA: Diagnosis not present

## 2016-12-08 DIAGNOSIS — R29898 Other symptoms and signs involving the musculoskeletal system: Secondary | ICD-10-CM | POA: Diagnosis not present

## 2016-12-08 NOTE — Therapy (Signed)
[place], [place], [address] Phone: [phone]   Fax:  [phone]  Physical Therapy Treatment  Patient Details  Name: Bryan Hays MRN: [id number] Date of Birth: 05-06-1946 Referring Provider: Dr. Ellene Route  Encounter Date: 12/08/2016      PT End of Session - 12/08/16 0959    Visit Number 10   Date for PT Re-Evaluation 12/29/16   PT Start Time 0923   PT Stop Time 1003   PT Time Calculation (min) 40 min   Activity Tolerance Patient tolerated treatment well   Behavior During Therapy [facility] for tasks assessed/performed      Past Medical History:  Diagnosis Date  . Allergic rhinitis due to pollen   . Degeneration of lumbar or lumbosacral intervertebral disc   . GERD (gastroesophageal reflux disease)   . Hyperlipidemia   . Personal history of urinary calculi   . Tendinitis     Past Surgical History:  Procedure Laterality Date  . FOOT SURGERY     Hx plantar fasciitis  . SHOULDER ARTHROSCOPY  2012   right shoulder   . TONSILLECTOMY      There were no vitals filed for this visit.      Subjective Assessment - 12/08/16 0925    Subjective Patient reports no pain in his back but his right knee is a little sore.    Currently in Pain? No/denies                         [facility] Adult PT Treatment/Exercise - 12/08/16 0001      Lumbar Exercises: Stretches   Passive Hamstring Stretch 4 reps;30 seconds   Quad Stretch 4 reps;30 seconds   Quad Stretch Limitations in prone, pull up to incoperate hip flexors   Piriformis Stretch 4 reps;30 seconds     Lumbar Exercises: Aerobic   Elliptical NuStep L6 x 6 min     Lumbar Exercises: Machines for Strengthening   Other Lumbar Machine Exercise Chops mid, left, right 2 x 10   Other Lumbar Machine Exercise black tband back ext 2 x 10     Lumbar Exercises: Standing   Other Standing Lumbar Exercises Airex 1/2 golf swings 2 x 10     Lumbar Exercises: Supine   Other Supine Lumbar Exercises PPT w/ alternating LEs     Lumbar Exercises: Prone   Opposite Arm/Leg Raise Right arm/Left leg;Left arm/Right leg;10 reps;3 seconds   Opposite Arm/Leg Raise Limitations 2 sets                      PT Long Term Goals - 12/02/16 5056      PT LONG TERM GOAL #1   Title Patient to be independent with advanced HEP (12/29/16)   Status Partially Met               Plan - 12/08/16 1000    Clinical Impression Statement Patient handled new exercises well, he still needed cues to maintain posterior tilt during stabilization exercises. He is very anxious to get back to golfing, he sees his MD tomorrow and plans to ask him about progression of golf swing. He caught many cramps during alternating exercises in both hamstrings.    PT Next Visit Plan Continue with lumbar and core stabilization with posterior pelvic tilt. Also continue with stretching to increase flexibilty.       Patient will benefit from skilled therapeutic  intervention in order to improve the following deficits and impairments:  Decreased activity tolerance, Decreased mobility, Decreased strength, Pain  Visit Diagnosis: Other symptoms and signs involving the musculoskeletal system  Acute low back pain, unspecified back pain laterality, with sciatica presence unspecified     Problem List Patient Active Problem List   Diagnosis Date Noted  . Spondylolisthesis at L5-S1 level 07/28/2016  . Elevated blood pressure 04/27/2014  . Decreased sex drive 17/98/1025  . Routine health maintenance 02/21/2011  . HYPERLIPIDEMIA 11/12/2007  . ALLERGIC RHINITIS, SEASONAL 11/12/2007  . GASTROESOPHAGEAL REFLUX DISEASE 11/12/2007  . [place] DISEASE, LUMBAR 11/12/2007  . NEPHROLITHIASIS, HX OF 11/12/2007    Alan Mulder SPTA 12/08/2016, 10:05 AM  [place], [place], [address] Phone:  [phone]   Fax:  [phone]  Name: GLOYD HAPP MRN: [id number] Date of Birth: 1946/02/08

## 2016-12-09 DIAGNOSIS — M4317 Spondylolisthesis, lumbosacral region: Secondary | ICD-10-CM | POA: Diagnosis not present

## 2016-12-11 ENCOUNTER — Encounter: Payer: Self-pay | Admitting: Physical Therapy

## 2016-12-11 ENCOUNTER — Ambulatory Visit: Payer: Medicare HMO | Admitting: Physical Therapy

## 2016-12-11 DIAGNOSIS — R29898 Other symptoms and signs involving the musculoskeletal system: Secondary | ICD-10-CM | POA: Diagnosis not present

## 2016-12-11 DIAGNOSIS — M545 Low back pain: Secondary | ICD-10-CM | POA: Diagnosis not present

## 2016-12-11 NOTE — Therapy (Signed)
[place], [place], [address] Phone: [phone]   Fax:  [phone]  Physical Therapy Treatment  Patient Details  Name: Bryan Hays MRN: [id number] Date of Birth: Oct 07, 1945 Referring Provider: Dr. Ellene Route  Encounter Date: 12/11/2016      PT End of Session - 12/11/16 0925    Visit Number 11   Date for PT Re-Evaluation 12/29/16   Authorization Type Medicare   PT Start Time 0845   PT Stop Time 0925   PT Time Calculation (min) 40 min   Activity Tolerance Patient tolerated treatment well   Behavior During Therapy [facility] for tasks assessed/performed      Past Medical History:  Diagnosis Date  . Allergic rhinitis due to pollen   . Degeneration of lumbar or lumbosacral intervertebral disc   . GERD (gastroesophageal reflux disease)   . Hyperlipidemia   . Personal history of urinary calculi   . Tendinitis     Past Surgical History:  Procedure Laterality Date  . FOOT SURGERY     Hx plantar fasciitis  . SHOULDER ARTHROSCOPY  2012   right shoulder   . TONSILLECTOMY      There were no vitals filed for this visit.      Subjective Assessment - 12/11/16 0845    Subjective Reports no pain ih his back, just his usualy stiffness and soreness this morning. Doctor told him that he could go back and play golf. Pt reported that he went to play golf yesterday.    Currently in Pain? No/denies   Pain Score 0-No pain                         [facility] Adult PT Treatment/Exercise - 12/11/16 0001      Lumbar Exercises: Stretches   Passive Hamstring Stretch 3 reps;20 seconds   Single Knee to Chest Stretch 10 seconds;1 rep     Lumbar Exercises: Aerobic   Elliptical lvl 2 3 fwd/3 back     Lumbar Exercises: Machines for Strengthening   Cybex Knee Extension 20lb 2x15   Cybex Knee Flexion 35lb 2x15   Leg Press 60lb 2x10   Other Lumbar Machine Exercise black tband back ext 2 x 15     Lumbar Exercises: Supine   Bridge 15 reps   Bridge Limitations 2 sets, 5lbs   Large Ball Abdominal Isometric 10 reps   Large Ball Abdominal Isometric Limitations 2 sets    Other Supine Lumbar Exercises SLR with 3lbs 2x10     Lumbar Exercises: Quadruped   Opposite Arm/Leg Raise Right arm/Left leg;Left arm/Right leg;10 reps  3lbs   Opposite Arm/Leg Raise Limitations 2 sets                     PT Long Term Goals - 12/02/16 3893      PT LONG TERM GOAL #1   Title Patient to be independent with advanced HEP (12/29/16)   Status Partially Met               Plan - 12/11/16 0925    Clinical Impression Statement Pt. was able to do all exercises well. Focused on more strengthening and core stability today since he is starting to play golf again. No complaints of pain and discomfort. Demonstrated proper techniques with core stability exercises.    Rehab Potential Good   PT Frequency 2x / week   PT Duration 8 weeks  PT Treatment/Interventions ADLs/Self Care Home Management;Cryotherapy;Electrical Stimulation;Moist Heat;Ultrasound;Neuromuscular re-education;Balance training;Therapeutic exercise;Therapeutic activities;Functional mobility training;Stair training;Gait training;Patient/family education;Manual techniques;Passive range of motion;Vasopneumatic Device;Taping;Dry needling   PT Next Visit Plan Continue with lumbar and core stabilization with posterior pelvic tilt. Also continue with stretching to increase flexibilty.    Consulted and Agree with Plan of Care Patient      Patient will benefit from skilled therapeutic intervention in order to improve the following deficits and impairments:  Decreased activity tolerance, Decreased mobility, Decreased strength, Pain  Visit Diagnosis: Other symptoms and signs involving the musculoskeletal system  Acute low back pain, unspecified back pain laterality, with sciatica presence unspecified     Problem List Patient Active  Problem List   Diagnosis Date Noted  . Spondylolisthesis at L5-S1 level 07/28/2016  . Elevated blood pressure 04/27/2014  . Decreased sex drive 54/98/2641  . Routine health maintenance 02/21/2011  . HYPERLIPIDEMIA 11/12/2007  . ALLERGIC RHINITIS, SEASONAL 11/12/2007  . GASTROESOPHAGEAL REFLUX DISEASE 11/12/2007  . [place] DISEASE, LUMBAR 11/12/2007  . NEPHROLITHIASIS, HX OF 11/12/2007    Octavia Bruckner 12/11/2016, 9:39 AM  [place], [place], [address] Phone: [phone]   Fax:  [phone]  Name: Bryan Hays MRN: [id number] Date of Birth: 10/12/1945

## 2016-12-15 DIAGNOSIS — Z0101 Encounter for examination of eyes and vision with abnormal findings: Secondary | ICD-10-CM | POA: Diagnosis not present

## 2016-12-16 ENCOUNTER — Ambulatory Visit: Payer: Medicare HMO

## 2016-12-17 ENCOUNTER — Ambulatory Visit: Payer: Medicare HMO | Admitting: Physical Therapy

## 2016-12-21 ENCOUNTER — Ambulatory Visit: Payer: Medicare HMO | Admitting: Physical Therapy

## 2016-12-21 ENCOUNTER — Encounter: Payer: Self-pay | Admitting: Physical Therapy

## 2016-12-21 DIAGNOSIS — M545 Low back pain: Secondary | ICD-10-CM

## 2016-12-21 DIAGNOSIS — R29898 Other symptoms and signs involving the musculoskeletal system: Secondary | ICD-10-CM | POA: Diagnosis not present

## 2016-12-21 NOTE — Therapy (Signed)
Bryan Hays, [place], [address] Phone: [phone]   Fax:  [phone]  Physical Therapy Treatment  Patient Details  Name: Bryan Hays MRN: [id number] Date of Birth: 1946/04/24 Referring Provider: Dr. Ellene Route  Encounter Date: 12/21/2016      PT End of Session - 12/21/16 1012    Visit Number 12   Date for PT Re-Evaluation 12/29/16   PT Start Time 0927   PT Stop Time 1012   PT Time Calculation (min) 45 min   Activity Tolerance Patient tolerated treatment well   Behavior During Therapy [facility] for tasks assessed/performed      Past Medical History:  Diagnosis Date  . Allergic rhinitis due to pollen   . Degeneration of lumbar or lumbosacral intervertebral disc   . GERD (gastroesophageal reflux disease)   . Hyperlipidemia   . Personal history of urinary calculi   . Tendinitis     Past Surgical History:  Procedure Laterality Date  . FOOT SURGERY     Hx plantar fasciitis  . SHOULDER ARTHROSCOPY  2012   right shoulder   . TONSILLECTOMY      There were no vitals filed for this visit.      Subjective Assessment - 12/21/16 0936    Subjective Pt reports that he was unable to attend therapy last week because he was working in his basement, keeping it from flooding. Reports no pain just stiffness.   Currently in Pain? No/denies   Pain Score 0-No pain                         [facility] Adult PT Treatment/Exercise - 12/21/16 0001      High Level Balance   High Level Balance Comments Golf swings on airex x15      Lumbar Exercises: Aerobic   Elliptical I10 R 7 66fd/3rev      Lumbar Exercises: Machines for Strengthening   Cybex Knee Extension 25lb 2x15   Cybex Knee Flexion 35lb 2x15   Leg Press 60lb 2x15   Other Lumbar Machine Exercise Lats Rows 45lb 2x10      Lumbar Exercises: Standing   Other Standing Lumbar Exercises ARE press 35lb x10 each   Other Standing Lumbar  Exercises cross body rotatins with yellow ball x10 each                     PT Long Term Goals - 12/21/16 1012      PT LONG TERM GOAL #1   Title Patient to be independent with advanced HEP (12/29/16)   Status Achieved     PT LONG TERM GOAL #2   Title Patient to demonstrate good body mechanics with ability to self-correct (12/29/16)   Status Achieved     PT LONG TERM GOAL #3   Title Patient to improve B LE strength by >/= 1 MMT grade (12/29/16)   Status Partially Met     PT LONG TERM GOAL #4   Title Patient to report ability to perform household tasks and ADLs with no back pain or limitations (12/29/16)   Status Achieved               Plan - 12/21/16 1013    Clinical Impression Statement Pt reports no pain only stiffness entering clinic. Pt fatigues quickly on elliptical requiring a seated rest break afterwards. Demos good LE strength on machines but reports that they were wore  out. difficulty with AR presses with weakness and fatigue.   Rehab Potential Good   PT Frequency 2x / week   PT Duration 8 weeks   PT Treatment/Interventions ADLs/Self Care Home Management;Cryotherapy;Electrical Stimulation;Moist Heat;Ultrasound;Neuromuscular re-education;Balance training;Therapeutic exercise;Therapeutic activities;Functional mobility training;Stair training;Gait training;Patient/family education;Manual techniques;Passive range of motion;Vasopneumatic Device;Taping;Dry needling   PT Next Visit Plan Continue with lumbar and core stabilization with posterior pelvic tilt. Also continue with stretching to increase flexibility.       Patient will benefit from skilled therapeutic intervention in order to improve the following deficits and impairments:  Decreased activity tolerance, Decreased mobility, Decreased strength, Pain  Visit Diagnosis: Other symptoms and signs involving the musculoskeletal system  Acute low back pain, unspecified back pain laterality, with sciatica presence  unspecified     Problem List Patient Active Problem List   Diagnosis Date Noted  . Spondylolisthesis at L5-S1 level 07/28/2016  . Elevated blood pressure 04/27/2014  . Decreased sex drive 18/36/7255  . Routine health maintenance 02/21/2011  . HYPERLIPIDEMIA 11/12/2007  . ALLERGIC RHINITIS, SEASONAL 11/12/2007  . GASTROESOPHAGEAL REFLUX DISEASE 11/12/2007  . [place] DISEASE, LUMBAR 11/12/2007  . NEPHROLITHIASIS, HX OF 11/12/2007    [name] 12/21/2016, 10:19 AM  [place], [place], [address] Phone: [phone]   Fax:  [phone]  Name: Bryan Hays MRN: [id number] Date of Birth: 06/08/1946

## 2016-12-24 ENCOUNTER — Ambulatory Visit: Payer: Medicare HMO | Attending: Neurological Surgery | Admitting: Physical Therapy

## 2016-12-24 ENCOUNTER — Encounter: Payer: Self-pay | Admitting: Physical Therapy

## 2016-12-24 DIAGNOSIS — M545 Low back pain: Secondary | ICD-10-CM | POA: Insufficient documentation

## 2016-12-24 DIAGNOSIS — R29898 Other symptoms and signs involving the musculoskeletal system: Secondary | ICD-10-CM | POA: Insufficient documentation

## 2016-12-24 NOTE — Therapy (Addendum)
[place], [place], [address] Phone: [phone]   Fax:  [phone]  Physical Therapy Treatment  Patient Details  Name: Bryan Hays MRN: [id number] Date of Birth: 08/23/1946 Referring Provider: Dr. Ellene Route  Encounter Date: 12/24/2016      PT End of Session - 12/24/16 0930    Visit Number 13   Date for PT Re-Evaluation 12/29/16   PT Start Time 0845   PT Stop Time 0930   PT Time Calculation (min) 45 min   Activity Tolerance Patient tolerated treatment well   Behavior During Therapy [facility] for tasks assessed/performed      Past Medical History:  Diagnosis Date  . Allergic rhinitis due to pollen   . Degeneration of lumbar or lumbosacral intervertebral disc   . GERD (gastroesophageal reflux disease)   . Hyperlipidemia   . Personal history of urinary calculi   . Tendinitis     Past Surgical History:  Procedure Laterality Date  . FOOT SURGERY     Hx plantar fasciitis  . SHOULDER ARTHROSCOPY  2012   right shoulder   . TONSILLECTOMY      There were no vitals filed for this visit.      Subjective Assessment - 12/24/16 0842    Subjective "I went back to work yesterday an played 6 holes of golf, Im sore today"   Currently in Pain? No/denies   Pain Score 0-No pain                         [facility] Adult PT Treatment/Exercise - 12/24/16 0001      Lumbar Exercises: Aerobic   Elliptical L5 x 7 min      Lumbar Exercises: Machines for Strengthening   Cybex Knee Extension 25lb 2x15   Cybex Knee Flexion 45lb 2x15   Leg Press 70lb 2x15   Other Lumbar Machine Exercise Lats & Rows 45lb 2x10      Lumbar Exercises: Standing   Other Standing Lumbar Exercises Squat and row 35lb 2x10; SL DL 7lb to 8in box x10 each; Standing straight arm pull down 45lb 2x15    Other Standing Lumbar Exercises cross body rotatins with yellow ball 2x10 each     Lumbar Exercises: Supine   Other Supine  Lumbar Exercises seated OHP 5lb 2x10      Lumbar Exercises: Prone   Other Prone Lumbar Exercises Prone planks 20 sec x3                      PT Long Term Goals - 12/21/16 1012      PT LONG TERM GOAL #1   Title Patient to be independent with advanced HEP (12/29/16)   Status Achieved     PT LONG TERM GOAL #2   Title Patient to demonstrate good body mechanics with ability to self-correct (12/29/16)   Status Achieved     PT LONG TERM GOAL #3   Title Patient to improve B LE strength by >/= 1 MMT grade (12/29/16)   Status Partially Met     PT LONG TERM GOAL #4   Title Patient to report ability to perform household tasks and ADLs with no back pain or limitations (12/29/16)   Status Achieved               Plan - 12/24/16 0931    Clinical Impression Statement Pt continues to report no pain only stiffness.  Completed all of today's exercises well. Does report some R knee soreness with standing squat and rows. Good core stabilization with prone planks.   Rehab Potential Good   PT Frequency 2x / week   PT Duration 8 weeks   PT Next Visit Plan Continue with lumbar and core stabilization with posterior pelvic tilt. Also continue with stretching to increase flexibility.       Patient will benefit from skilled therapeutic intervention in order to improve the following deficits and impairments:  Decreased activity tolerance, Decreased mobility, Decreased strength, Pain  Visit Diagnosis: Other symptoms and signs involving the musculoskeletal system  Acute low back pain, unspecified back pain laterality, with sciatica presence unspecified     Problem List Patient Active Problem List   Diagnosis Date Noted  . Spondylolisthesis at L5-S1 level 07/28/2016  . Elevated blood pressure 04/27/2014  . Decreased sex drive 16/60/6301  . Routine health maintenance 02/21/2011  . HYPERLIPIDEMIA 11/12/2007  . ALLERGIC RHINITIS, SEASONAL 11/12/2007  . GASTROESOPHAGEAL REFLUX DISEASE  11/12/2007  . [place] DISEASE, LUMBAR 11/12/2007  . NEPHROLITHIASIS, HX OF 11/12/2007   PHYSICAL THERAPY DISCHARGE SUMMARY  Visits from Start of Care: 13 Plan: Patient agrees to discharge.  Patient goals were partially met. Patient is being discharged due to being pleased with the current functional level.  ?????     Willey Blade 12/24/2016, 9:32 AM  [place], [place], [address] Phone: [phone]   Fax:  [phone]  Name: Bryan Hays MRN: [id number] Date of Birth: 09-14-1945

## 2017-01-22 DIAGNOSIS — M25561 Pain in right knee: Secondary | ICD-10-CM | POA: Diagnosis not present

## 2017-02-04 DIAGNOSIS — M25561 Pain in right knee: Secondary | ICD-10-CM | POA: Diagnosis not present

## 2017-02-05 DIAGNOSIS — M25561 Pain in right knee: Secondary | ICD-10-CM | POA: Diagnosis not present

## 2017-04-07 DIAGNOSIS — I10 Essential (primary) hypertension: Secondary | ICD-10-CM | POA: Diagnosis not present

## 2017-04-07 DIAGNOSIS — M4317 Spondylolisthesis, lumbosacral region: Secondary | ICD-10-CM | POA: Diagnosis not present

## 2017-04-07 DIAGNOSIS — Z6834 Body mass index (BMI) 34.0-34.9, adult: Secondary | ICD-10-CM | POA: Diagnosis not present

## 2017-04-23 DIAGNOSIS — M48061 Spinal stenosis, lumbar region without neurogenic claudication: Secondary | ICD-10-CM | POA: Diagnosis not present

## 2017-04-23 DIAGNOSIS — M5416 Radiculopathy, lumbar region: Secondary | ICD-10-CM | POA: Diagnosis not present

## 2017-04-23 DIAGNOSIS — M5136 Other intervertebral disc degeneration, lumbar region: Secondary | ICD-10-CM | POA: Diagnosis not present

## 2017-04-23 DIAGNOSIS — Z981 Arthrodesis status: Secondary | ICD-10-CM | POA: Diagnosis not present

## 2017-04-23 DIAGNOSIS — M4726 Other spondylosis with radiculopathy, lumbar region: Secondary | ICD-10-CM | POA: Diagnosis not present

## 2017-06-29 DIAGNOSIS — Z1331 Encounter for screening for depression: Secondary | ICD-10-CM | POA: Diagnosis not present

## 2017-06-29 DIAGNOSIS — Z0000 Encounter for general adult medical examination without abnormal findings: Secondary | ICD-10-CM | POA: Diagnosis not present

## 2017-06-29 DIAGNOSIS — Z125 Encounter for screening for malignant neoplasm of prostate: Secondary | ICD-10-CM | POA: Diagnosis not present

## 2017-06-29 DIAGNOSIS — I10 Essential (primary) hypertension: Secondary | ICD-10-CM | POA: Diagnosis not present

## 2017-06-29 DIAGNOSIS — E7800 Pure hypercholesterolemia, unspecified: Secondary | ICD-10-CM | POA: Diagnosis not present

## 2017-06-29 DIAGNOSIS — E669 Obesity, unspecified: Secondary | ICD-10-CM | POA: Diagnosis not present

## 2017-06-29 DIAGNOSIS — R69 Illness, unspecified: Secondary | ICD-10-CM | POA: Diagnosis not present

## 2017-06-29 DIAGNOSIS — Z79899 Other long term (current) drug therapy: Secondary | ICD-10-CM | POA: Diagnosis not present

## 2017-06-29 DIAGNOSIS — Z23 Encounter for immunization: Secondary | ICD-10-CM | POA: Diagnosis not present

## 2017-11-15 DIAGNOSIS — M25562 Pain in left knee: Secondary | ICD-10-CM | POA: Diagnosis not present

## 2017-12-13 ENCOUNTER — Ambulatory Visit (INDEPENDENT_AMBULATORY_CARE_PROVIDER_SITE_OTHER): Payer: Medicare HMO | Admitting: Physician Assistant

## 2017-12-13 ENCOUNTER — Encounter: Payer: Self-pay | Admitting: Physician Assistant

## 2017-12-13 ENCOUNTER — Other Ambulatory Visit: Payer: Self-pay

## 2017-12-13 VITALS — BP 130/90 | HR 87 | Temp 98.0°F | Resp 18 | Ht 66.0 in | Wt 222.4 lb

## 2017-12-13 DIAGNOSIS — J22 Unspecified acute lower respiratory infection: Secondary | ICD-10-CM

## 2017-12-13 MED ORDER — AZITHROMYCIN 250 MG PO TABS: ORAL_TABLET | ORAL | 0 refills | Status: DC

## 2017-12-13 NOTE — Progress Notes (Signed)
12/14/2017 4:37 PM   DOB: 14-Dec-1945 / MRN: [id number]  SUBJECTIVE:  Bryan Hays is a 72 y.o. male presenting for cough for the last 3 weeks.  Patient has tried several over-the-counter products that include Sudafed and is having minimal relief.  Reports the cough is productive of thick green sputum.  His wife was here last week he states that she was diagnosed with pneumonia on radiograph.  He denies any fever, leg swelling, chest pain, shortness of breath, DOE.  He tells me "it has been 3 weeks and no matter what I try to just cannot seem to get better."  He is allergic to banana and sulfonamide derivatives.   He  has a past medical history of Allergic rhinitis due to pollen, Degeneration of lumbar or lumbosacral intervertebral disc, GERD (gastroesophageal reflux disease), Hyperlipidemia, Personal history of urinary calculi, and Tendinitis.    He  reports that he has never smoked. He has never used smokeless tobacco. He reports that he drinks alcohol. He reports that he does not use drugs. He  reports that he currently engages in sexual activity and has had partners who are Male. The patient  has a past surgical history that includes Tonsillectomy; Foot surgery; and Shoulder arthroscopy (2012).  His family history includes Alzheimer's disease in his mother; COPD in his father; Coronary artery disease in his father; Diabetes in his father and mother; Heart disease in his father and mother; Hypertension in his father; Other in his father.  Review of Systems  Constitutional: Negative for chills, diaphoresis and fever.  HENT: Positive for congestion.   Eyes: Negative.   Respiratory: Positive for cough and sputum production. Negative for hemoptysis, shortness of breath and wheezing.   Cardiovascular: Negative for chest pain, orthopnea and leg swelling.  Gastrointestinal: Negative for abdominal pain, blood in stool, constipation, diarrhea, heartburn, melena, nausea and vomiting.    Genitourinary: Negative for flank pain.  Skin: Negative for rash.  Neurological: Negative for dizziness, sensory change, speech change, focal weakness and headaches.    The problem list and medications were reviewed and updated by myself where necessary and exist elsewhere in the encounter.   OBJECTIVE:  BP 130/90 (BP Location: Left Arm, Patient Position: Sitting, Cuff Size: Normal)   Pulse 87   Temp 98 F (36.7 C) (Oral)   Resp 18   Ht 5\' 6"  (1.676 m)   Wt 222 lb 6.4 oz (100.9 kg)   SpO2 95%   BMI 35.90 kg/m    Physical Exam  Constitutional: He is oriented to person, place, and time. He appears well-developed. He does not appear ill.  Eyes: Pupils are equal, round, and reactive to light. Conjunctivae and EOM are normal.  Cardiovascular: Normal rate, regular rhythm, S1 normal, S2 normal, normal heart sounds, intact distal pulses and normal pulses. Exam reveals no gallop and no friction rub.  No murmur heard. Pulmonary/Chest: Effort normal. No stridor. No respiratory distress. He has no wheezes. He has no rales.  Abdominal: He exhibits no distension.  Musculoskeletal: Normal range of motion. He exhibits no edema.  Neurological: He is alert and oriented to person, place, and time. No cranial nerve deficit. Coordination normal.  Skin: Skin is warm and dry. He is not diaphoretic.  Psychiatric: He has a normal mood and affect.  Nursing note and vitals reviewed.   No results found for this or any previous visit (from the past 72 hour(s)).  No results found.  ASSESSMENT AND PLAN:  Kevyn was seen  today for cough.  Diagnoses and all orders for this visit:  LRTI (lower respiratory tract infection) he has a good exam.  His symptoms have been pushing through several allergy products along with pseudoephedrine.  I see no signs that this is allergic in nature.  He may have a an atypical infectious process at play which could explain the long duration of the symptoms.  He would like  to try an antibiotic and I am inclined to agree.  I will start him on azithromycin.  He can come back here if he continues to have symptoms for further work-up. -     azithromycin (ZITHROMAX) 250 MG tablet; Take two tabs on day one one daily thereafter.    The patient is advised to call or return to clinic if he does not see an improvement in symptoms, or to seek the care of the closest emergency department if he worsens with the above plan.   Philis Fendt, MHS, PA-C [facility] 12/14/2017 4:37 PM

## 2017-12-13 NOTE — Patient Instructions (Addendum)
Start the antibiotic.  I hope you feel better in the next few days.  If you do not come back for a chest xray.     IF you received an x-ray today, you will receive an invoice from [facility]. Please contact [facility] at [phone] with questions or concerns regarding your invoice.   IF you received labwork today, you will receive an invoice from [facility]. Please contact [facility] at [phone] with questions or concerns regarding your invoice.   Our billing staff will not be able to assist you with questions regarding bills from these companies.  You will be contacted with the lab results as soon as they are available. The fastest way to get your results is to activate your My Chart account. Instructions are located on the last page of this paperwork. If you have not heard from [place] regarding the results in 2 weeks, please contact this office.

## 2017-12-15 DIAGNOSIS — M25562 Pain in left knee: Secondary | ICD-10-CM | POA: Diagnosis not present

## 2017-12-23 DIAGNOSIS — M25562 Pain in left knee: Secondary | ICD-10-CM | POA: Diagnosis not present

## 2017-12-27 DIAGNOSIS — I10 Essential (primary) hypertension: Secondary | ICD-10-CM | POA: Diagnosis not present

## 2017-12-27 DIAGNOSIS — Z6835 Body mass index (BMI) 35.0-35.9, adult: Secondary | ICD-10-CM | POA: Diagnosis not present

## 2017-12-27 DIAGNOSIS — E669 Obesity, unspecified: Secondary | ICD-10-CM | POA: Diagnosis not present

## 2017-12-27 DIAGNOSIS — M25562 Pain in left knee: Secondary | ICD-10-CM | POA: Diagnosis not present

## 2018-01-27 DIAGNOSIS — Y999 Unspecified external cause status: Secondary | ICD-10-CM | POA: Diagnosis not present

## 2018-01-27 DIAGNOSIS — S83232A Complex tear of medial meniscus, current injury, left knee, initial encounter: Secondary | ICD-10-CM | POA: Diagnosis not present

## 2018-01-27 DIAGNOSIS — M2242 Chondromalacia patellae, left knee: Secondary | ICD-10-CM | POA: Diagnosis not present

## 2018-01-27 DIAGNOSIS — G8918 Other acute postprocedural pain: Secondary | ICD-10-CM | POA: Diagnosis not present

## 2018-02-04 DIAGNOSIS — M25662 Stiffness of left knee, not elsewhere classified: Secondary | ICD-10-CM | POA: Diagnosis not present

## 2018-02-04 DIAGNOSIS — M25562 Pain in left knee: Secondary | ICD-10-CM | POA: Diagnosis not present

## 2018-02-04 DIAGNOSIS — M2242 Chondromalacia patellae, left knee: Secondary | ICD-10-CM | POA: Diagnosis not present

## 2018-02-04 DIAGNOSIS — Z9889 Other specified postprocedural states: Secondary | ICD-10-CM | POA: Diagnosis not present

## 2018-02-08 DIAGNOSIS — M25562 Pain in left knee: Secondary | ICD-10-CM | POA: Diagnosis not present

## 2018-02-08 DIAGNOSIS — M25662 Stiffness of left knee, not elsewhere classified: Secondary | ICD-10-CM | POA: Diagnosis not present

## 2018-02-08 DIAGNOSIS — M2242 Chondromalacia patellae, left knee: Secondary | ICD-10-CM | POA: Diagnosis not present

## 2018-02-10 DIAGNOSIS — M25662 Stiffness of left knee, not elsewhere classified: Secondary | ICD-10-CM | POA: Diagnosis not present

## 2018-02-10 DIAGNOSIS — M2242 Chondromalacia patellae, left knee: Secondary | ICD-10-CM | POA: Diagnosis not present

## 2018-02-10 DIAGNOSIS — M25562 Pain in left knee: Secondary | ICD-10-CM | POA: Diagnosis not present

## 2018-02-11 DIAGNOSIS — H2513 Age-related nuclear cataract, bilateral: Secondary | ICD-10-CM | POA: Diagnosis not present

## 2018-02-14 DIAGNOSIS — M2242 Chondromalacia patellae, left knee: Secondary | ICD-10-CM | POA: Diagnosis not present

## 2018-02-14 DIAGNOSIS — M25662 Stiffness of left knee, not elsewhere classified: Secondary | ICD-10-CM | POA: Diagnosis not present

## 2018-02-14 DIAGNOSIS — M25562 Pain in left knee: Secondary | ICD-10-CM | POA: Diagnosis not present

## 2018-02-16 DIAGNOSIS — M25662 Stiffness of left knee, not elsewhere classified: Secondary | ICD-10-CM | POA: Diagnosis not present

## 2018-02-16 DIAGNOSIS — M25562 Pain in left knee: Secondary | ICD-10-CM | POA: Diagnosis not present

## 2018-02-16 DIAGNOSIS — M2242 Chondromalacia patellae, left knee: Secondary | ICD-10-CM | POA: Diagnosis not present

## 2018-02-17 DIAGNOSIS — D210 Benign neoplasm of connective and other soft tissue of head, face and neck: Secondary | ICD-10-CM | POA: Diagnosis not present

## 2018-02-21 DIAGNOSIS — M2242 Chondromalacia patellae, left knee: Secondary | ICD-10-CM | POA: Diagnosis not present

## 2018-02-21 DIAGNOSIS — M25662 Stiffness of left knee, not elsewhere classified: Secondary | ICD-10-CM | POA: Diagnosis not present

## 2018-02-21 DIAGNOSIS — M25562 Pain in left knee: Secondary | ICD-10-CM | POA: Diagnosis not present

## 2018-02-28 DIAGNOSIS — M25662 Stiffness of left knee, not elsewhere classified: Secondary | ICD-10-CM | POA: Diagnosis not present

## 2018-02-28 DIAGNOSIS — M25562 Pain in left knee: Secondary | ICD-10-CM | POA: Diagnosis not present

## 2018-02-28 DIAGNOSIS — M2242 Chondromalacia patellae, left knee: Secondary | ICD-10-CM | POA: Diagnosis not present

## 2018-05-26 DIAGNOSIS — T1512XA Foreign body in conjunctival sac, left eye, initial encounter: Secondary | ICD-10-CM | POA: Diagnosis not present

## 2018-07-13 DIAGNOSIS — R739 Hyperglycemia, unspecified: Secondary | ICD-10-CM | POA: Diagnosis not present

## 2018-07-13 DIAGNOSIS — Z79899 Other long term (current) drug therapy: Secondary | ICD-10-CM | POA: Diagnosis not present

## 2018-07-13 DIAGNOSIS — K219 Gastro-esophageal reflux disease without esophagitis: Secondary | ICD-10-CM | POA: Diagnosis not present

## 2018-07-13 DIAGNOSIS — E7800 Pure hypercholesterolemia, unspecified: Secondary | ICD-10-CM | POA: Diagnosis not present

## 2018-07-13 DIAGNOSIS — Z0000 Encounter for general adult medical examination without abnormal findings: Secondary | ICD-10-CM | POA: Diagnosis not present

## 2018-07-13 DIAGNOSIS — I10 Essential (primary) hypertension: Secondary | ICD-10-CM | POA: Diagnosis not present

## 2018-07-13 DIAGNOSIS — Z1389 Encounter for screening for other disorder: Secondary | ICD-10-CM | POA: Diagnosis not present

## 2018-08-20 ENCOUNTER — Encounter: Payer: Self-pay | Admitting: Family Medicine

## 2018-08-20 ENCOUNTER — Ambulatory Visit: Payer: Self-pay | Admitting: Family Medicine

## 2018-08-20 VITALS — BP 120/80 | HR 97 | Temp 98.5°F | Resp 18 | Wt 214.4 lb

## 2018-08-20 DIAGNOSIS — J019 Acute sinusitis, unspecified: Secondary | ICD-10-CM

## 2018-08-20 DIAGNOSIS — R0981 Nasal congestion: Secondary | ICD-10-CM

## 2018-08-20 MED ORDER — PSEUDOEPH-BROMPHEN-DM 30-2-10 MG/5ML PO SYRP: 10.0000 mL | ORAL_SOLUTION | Freq: Three times a day (TID) | ORAL | 0 refills | Status: DC | PRN

## 2018-08-20 MED ORDER — AZELASTINE HCL 0.1 % NA SOLN: 1.0000 | Freq: Two times a day (BID) | NASAL | 0 refills | Status: DC

## 2018-08-20 MED ORDER — AMOXICILLIN-POT CLAVULANATE 875-125 MG PO TABS: 1.0000 | ORAL_TABLET | Freq: Two times a day (BID) | ORAL | 0 refills | Status: AC

## 2018-08-20 NOTE — Patient Instructions (Signed)
Sinusitis, Adult  Sinusitis is inflammation of your sinuses. Sinuses are hollow spaces in the bones around your face. Your sinuses are located:   Around your eyes.   In the middle of your forehead.   Behind your nose.   In your cheekbones.  Mucus normally drains out of your sinuses. When your nasal tissues become inflamed or swollen, mucus can become trapped or blocked. This allows bacteria, viruses, and fungi to grow, which leads to infection. Most infections of the sinuses are caused by a virus.  Sinusitis can develop quickly. It can last for up to 4 weeks (acute) or for more than 12 weeks (chronic). Sinusitis often develops after a cold.  What are the causes?  This condition is caused by anything that creates swelling in the sinuses or stops mucus from draining. This includes:   Allergies.   Asthma.   Infection from bacteria or viruses.   Deformities or blockages in your nose or sinuses.   Abnormal growths in the nose (nasal polyps).   Pollutants, such as chemicals or irritants in the air.   Infection from fungi (rare).  What increases the risk?  You are more likely to develop this condition if you:   Have a weak body defense system (immune system).   Do a lot of swimming or diving.   Overuse nasal sprays.   Smoke.  What are the signs or symptoms?  The main symptoms of this condition are pain and a feeling of pressure around the affected sinuses. Other symptoms include:   Stuffy nose or congestion.   Thick drainage from your nose.   Swelling and warmth over the affected sinuses.   Headache.   Upper toothache.   A cough that may get worse at night.   Extra mucus that collects in the throat or the back of the nose (postnasal drip).   Decreased sense of smell and taste.   Fatigue.   A fever.   Sore throat.   Bad breath.  How is this diagnosed?  This condition is diagnosed based on:   Your symptoms.   Your medical history.   A physical exam.   Tests to find out if your condition is  acute or chronic. This may include:  ? Checking your nose for nasal polyps.  ? Viewing your sinuses using a device that has a light (endoscope).  ? Testing for allergies or bacteria.  ? Imaging tests, such as an MRI or CT scan.  In rare cases, a bone biopsy may be done to rule out more serious types of fungal sinus disease.  How is this treated?  Treatment for sinusitis depends on the cause and whether your condition is chronic or acute.   If caused by a virus, your symptoms should go away on their own within 10 days. You may be given medicines to relieve symptoms. They include:  ? Medicines that shrink swollen nasal passages (topical intranasal decongestants).  ? Medicines that treat allergies (antihistamines).  ? A spray that eases inflammation of the nostrils (topical intranasal corticosteroids).  ? Rinses that help get rid of thick mucus in your nose (nasal saline washes).   If caused by bacteria, your health care provider may recommend waiting to see if your symptoms improve. Most bacterial infections will get better without antibiotic medicine. You may be given antibiotics if you have:  ? A severe infection.  ? A weak immune system.   If caused by narrow nasal passages or nasal polyps, you may need   to have surgery.  Follow these instructions at home:  Medicines   Take, use, or apply over-the-counter and prescription medicines only as told by your health care provider. These may include nasal sprays.   If you were prescribed an antibiotic medicine, take it as told by your health care provider. Do not stop taking the antibiotic even if you start to feel better.  Hydrate and humidify     Drink enough fluid to keep your urine pale yellow. Staying hydrated will help to thin your mucus.   Use a cool mist humidifier to keep the humidity level in your home above 50%.   Inhale steam for 10-15 minutes, 3-4 times a day, or as told by your health care provider. You can do this in the bathroom while a hot shower is  running.   Limit your exposure to cool or dry air.  Rest   Rest as much as possible.   Sleep with your head raised (elevated).   Make sure you get enough sleep each night.  General instructions     Apply a warm, moist washcloth to your face 3-4 times a day or as told by your health care provider. This will help with discomfort.   Wash your hands often with soap and water to reduce your exposure to germs. If soap and water are not available, use hand sanitizer.   Do not smoke. Avoid being around people who are smoking (secondhand smoke).   Keep all follow-up visits as told by your health care provider. This is important.  Contact a health care provider if:   You have a fever.   Your symptoms get worse.   Your symptoms do not improve within 10 days.  Get help right away if:   You have a severe headache.   You have persistent vomiting.   You have severe pain or swelling around your face or eyes.   You have vision problems.   You develop confusion.   Your neck is stiff.   You have trouble breathing.  Summary   Sinusitis is soreness and inflammation of your sinuses. Sinuses are hollow spaces in the bones around your face.   This condition is caused by nasal tissues that become inflamed or swollen. The swelling traps or blocks the flow of mucus. This allows bacteria, viruses, and fungi to grow, which leads to infection.   If you were prescribed an antibiotic medicine, take it as told by your health care provider. Do not stop taking the antibiotic even if you start to feel better.   Keep all follow-up visits as told by your health care provider. This is important.  This information is not intended to replace advice given to you by your health care provider. Make sure you discuss any questions you have with your health care provider.  Document Released: 08/10/2005 Document Revised: 01/10/2018 Document Reviewed: 01/10/2018  [employer] Interactive Patient Education  2019 [employer]

## 2018-08-20 NOTE — Progress Notes (Signed)
Bryan Hays is a 72 y.o. male who presents today with 7 days of sinus congestion, cough, ear pressure and temperature. Patient has attempted multiple over the counter medications like mucinex and Dyquil attempted treatments included and this has not improved symptoms. He has a known sick contact of his wife who is also symptomatic with similar symptoms.  Review of Systems  Constitutional: Positive for malaise/fatigue. Negative for chills and fever.  HENT: Positive for congestion and sinus pain. Negative for ear discharge, ear pain and sore throat.   Eyes: Negative.   Respiratory: Negative for cough, sputum production and shortness of breath.   Cardiovascular: Negative.  Negative for chest pain.  Gastrointestinal: Positive for nausea. Negative for abdominal pain, diarrhea and vomiting.  Genitourinary: Negative for dysuria, frequency, hematuria and urgency.  Musculoskeletal: Negative for myalgias.  Skin: Negative.   Endo/Heme/Allergies: Negative.   Psychiatric/Behavioral: Negative.     Bryan Hays has a current medication list which includes the following prescription(s): aspirin, famotidine, lisinopril, rosuvastatin, amoxicillin-clavulanate, azelastine, azithromycin, brompheniramine-pseudoephedrine-dm, cetirizine, esomeprazole, and naproxen sodium. Also is allergic to banana and sulfonamide derivatives.  Bryan Hays  has a past medical history of Allergic rhinitis due to pollen, Degeneration of lumbar or lumbosacral intervertebral disc, GERD (gastroesophageal reflux disease), Hyperlipidemia, Personal history of urinary calculi, and Tendinitis. Also  has a past surgical history that includes Tonsillectomy; Foot surgery; and Shoulder arthroscopy (2012).    O: Vitals:   08/20/18 1202  BP: 120/80  Pulse: 97  Resp: 18  Temp: 98.5 F (36.9 C)  SpO2: 95%     Physical Exam Vitals signs reviewed.  Constitutional:      Appearance: He is well-developed. He is not toxic-appearing.  HENT:     Head:  Normocephalic.     Right Ear: Hearing, tympanic membrane, ear canal and external ear normal.     Left Ear: Hearing, tympanic membrane, ear canal and external ear normal.     Nose:     Right Sinus: Frontal sinus tenderness present. No maxillary sinus tenderness.     Left Sinus: Frontal sinus tenderness present. No maxillary sinus tenderness.     Mouth/Throat:     Pharynx: Uvula midline. Posterior oropharyngeal erythema present.     Tonsils: No tonsillar exudate.  Neck:     Musculoskeletal: Normal range of motion and neck supple.  Cardiovascular:     Rate and Rhythm: Normal rate and regular rhythm.     Pulses: Normal pulses.     Heart sounds: Normal heart sounds.  Pulmonary:     Effort: Pulmonary effort is normal.     Breath sounds: Normal breath sounds.  Abdominal:     General: Bowel sounds are normal.     Palpations: Abdomen is soft.  Musculoskeletal: Normal range of motion.  Lymphadenopathy:     Head:     Right side of head: No submental or submandibular adenopathy.     Left side of head: No submental or submandibular adenopathy.     Cervical: No cervical adenopathy.  Neurological:     Mental Status: He is alert and oriented to person, place, and time.    A: 1. Acute non-recurrent sinusitis, unspecified location   2. Nasal congestion    P: 1. Acute non-recurrent sinusitis, unspecified location - amoxicillin-clavulanate (AUGMENTIN) 875-125 MG tablet; Take 1 tablet by mouth 2 (two) times daily for 7 days. - brompheniramine-pseudoephedrine-DM 30-2-10 MG/5ML syrup; Take 10 mLs by mouth 3 (three) times daily as needed.  2. Nasal congestion - azelastine (ASTELIN) 0.1 %  nasal spray; Place 1 spray into both nostrils 2 (two) times daily. Use in each nostril as directed  Treated as bacterial sinus infection due to length of symptoms and report of fever and known sick contacts.  Discussed with patient exam findings, suspected diagnosis etiology and  reviewed recommended treatment  plan and follow up, including complications and indications for urgent medical follow up and evaluation. Medications including use and indications reviewed with patient. Patient provided relevant patient education on diagnosis and/or relevant related condition that were discussed and reviewed with patient at discharge. Patient verbalized understanding of information provided and agrees with plan of care (POC), all questions answered.

## 2018-11-09 ENCOUNTER — Other Ambulatory Visit: Payer: Self-pay

## 2018-11-09 ENCOUNTER — Encounter: Payer: Self-pay | Admitting: Family Medicine

## 2018-11-09 ENCOUNTER — Ambulatory Visit: Payer: Self-pay | Admitting: Family Medicine

## 2018-11-09 DIAGNOSIS — R6889 Other general symptoms and signs: Secondary | ICD-10-CM

## 2018-11-09 DIAGNOSIS — J302 Other seasonal allergic rhinitis: Secondary | ICD-10-CM

## 2018-11-09 LAB — POCT INFLUENZA A/B
INFLUENZA A, POC: NEGATIVE
INFLUENZA B, POC: NEGATIVE

## 2018-11-09 MED ORDER — FLUTICASONE PROPIONATE 50 MCG/ACT NA SUSP: 2.0000 | Freq: Every day | NASAL | 0 refills | Status: DC

## 2018-11-09 NOTE — Patient Instructions (Signed)

## 2018-11-09 NOTE — Progress Notes (Signed)
Bryan Hays is a 73 y.o. male who presents today with 2 days of fatigue, sneezing, sore throat and cough, patient has attempted treatments of various over the counter cough and cold medications with some relief. He has a low grader fever and reports that his wife has a cold at this time. He denies any recent travel to [place] endemic area, attendance at large gatherings, or known contacts to sick friends or family. He has a primary care and chronic health conditions are under control. He does report a history of seasonal allergies when asked and has recently been doing yard work.  Review of Systems  Constitutional: Positive for fever and malaise/fatigue. Negative for chills.  HENT: Positive for sore throat. Negative for congestion, ear discharge, ear pain and sinus pain.   Eyes: Negative.   Respiratory: Positive for cough. Negative for sputum production, shortness of breath and wheezing.   Cardiovascular: Negative.  Negative for chest pain.  Gastrointestinal: Negative for abdominal pain, diarrhea, nausea and vomiting.  Genitourinary: Negative for dysuria, frequency, hematuria and urgency.  Musculoskeletal: Negative for myalgias.  Skin: Negative.   Neurological: Negative for dizziness and headaches.  Endo/Heme/Allergies: Negative.   Psychiatric/Behavioral: Negative.     Bryan Hays has a current medication list which includes the following prescription(s): acetaminophen, aspirin, cetirizine, dm-doxylamine-acetaminophen, esomeprazole, guaifenesin, lisinopril, rosuvastatin, azelastine, azithromycin, brompheniramine-pseudoephedrine-dm, famotidine, fluticasone, and naproxen sodium. Also is allergic to banana and sulfonamide derivatives.  Bryan Hays  has a past medical history of Allergic rhinitis due to pollen, Degeneration of lumbar or lumbosacral intervertebral disc, GERD (gastroesophageal reflux disease), Hyperlipidemia, Personal history of urinary calculi, and Tendinitis. Also  has a past surgical history  that includes Tonsillectomy; Foot surgery; and Shoulder arthroscopy (2012).    O: Vitals:   11/09/18 1829  BP: 135/70  Pulse: 85  Resp: 18  Temp: 99.3 F (37.4 C)  SpO2: 96%     Physical Exam Vitals signs reviewed.  Constitutional:      Appearance: He is well-developed. He is not toxic-appearing.  HENT:     Head: Normocephalic.     Right Ear: Hearing, tympanic membrane, ear canal and external ear normal.     Left Ear: Hearing, tympanic membrane, ear canal and external ear normal.     Nose: Rhinorrhea present. No congestion.     Right Sinus: No maxillary sinus tenderness or frontal sinus tenderness.     Left Sinus: No maxillary sinus tenderness or frontal sinus tenderness.     Mouth/Throat:     Lips: Pink.     Mouth: Mucous membranes are moist.     Pharynx: Uvula midline. Posterior oropharyngeal erythema present. No pharyngeal swelling, oropharyngeal exudate or uvula swelling.     Tonsils: No tonsillar exudate or tonsillar abscesses. Swelling: 1+ on the right. 1+ on the left.  Neck:     Musculoskeletal: Normal range of motion and neck supple.  Cardiovascular:     Rate and Rhythm: Normal rate and regular rhythm.     Pulses: Normal pulses.     Heart sounds: Normal heart sounds.  Pulmonary:     Effort: Pulmonary effort is normal.     Breath sounds: Normal breath sounds. No decreased breath sounds, wheezing, rhonchi or rales.  Abdominal:     General: Bowel sounds are normal.     Palpations: Abdomen is soft.  Musculoskeletal: Normal range of motion.  Lymphadenopathy:     Head:     Right side of head: No submental or submandibular adenopathy.     Left  side of head: No submental or submandibular adenopathy.     Cervical: No cervical adenopathy.  Neurological:     Mental Status: He is alert and oriented to person, place, and time.    A: 1. Flu-like symptoms   2. Seasonal allergic rhinitis, unspecified trigger    P: Suspect allergies related to known allergies  untreated and recent yard work. Patient could have a viral illness that would be self limiting and respond to continuing the supportive care techniques he has been using with OTC medications. POCT influenza was negative today, no known risk factors for covid, will treat allergies and advise patient to follow up if symptoms do not improve. Patient hesitant to use Flonase as he does not like nasal sprays but did state that he would try it.  1. Flu-like symptoms - POCT Influenza A/B Results for orders placed or performed in visit on 11/09/18 (from the past 24 hour(s))  POCT Influenza A/B     Status: Normal   Collection Time: 11/09/18  6:51 PM  Result Value Ref Range   Influenza A, POC Negative Negative   Influenza B, POC Negative Negative    2. Seasonal allergic rhinitis, unspecified trigger - fluticasone (FLONASE) 50 MCG/ACT nasal spray; Place 2 sprays into both nostrils daily.  Other orders - DM-Doxylamine-Acetaminophen (NYQUIL COLD & FLU PO); Take by mouth. - guaiFENesin (TUSSIN CHEST CONGESTION PO); Take by mouth. - Acetaminophen (TYLENOL 8 HOUR PO); Take by mouth.   Discussed with patient exam findings, suspected diagnosis etiology and  reviewed recommended treatment plan and follow up, including complications and indications for urgent medical follow up and evaluation. Medications including use and indications reviewed with patient. Patient provided relevant patient education on diagnosis and/or relevant related condition that were discussed and reviewed with patient at discharge. Patient verbalized understanding of information provided and agrees with plan of care (POC), all questions answered.

## 2018-11-11 ENCOUNTER — Telehealth: Payer: Self-pay

## 2018-11-11 NOTE — Telephone Encounter (Signed)
Called and lm for pt tcb regarding how he is feeling since his visit with [place].

## 2018-11-16 ENCOUNTER — Other Ambulatory Visit: Payer: Self-pay | Admitting: Nurse Practitioner

## 2018-11-16 MED ORDER — PSEUDOEPH-BROMPHEN-DM 30-2-10 MG/5ML PO SYRP
5.0000 mL | ORAL_SOLUTION | Freq: Four times a day (QID) | ORAL | 0 refills | Status: AC | PRN
Start: 2018-11-16 — End: 2018-11-23

## 2018-11-16 NOTE — Progress Notes (Signed)
Patient called stating he has had continued cough since his visit on 11/09/2018.  The patient informed that the provider told him that if his symptoms do not improve to contact our office.  Patient informs that his cough is worse at night.  Form patient that I will call in a prescription for Bromfed for him to his preferred pharmacy.  Patient was instructed that if his symptoms are not improving within the next 48 to 72 hours, he will need to follow-up with his PCP.  Patient verbalized understanding.

## 2019-01-12 DIAGNOSIS — I10 Essential (primary) hypertension: Secondary | ICD-10-CM | POA: Diagnosis not present

## 2019-01-12 DIAGNOSIS — K219 Gastro-esophageal reflux disease without esophagitis: Secondary | ICD-10-CM | POA: Diagnosis not present

## 2019-03-09 DIAGNOSIS — H43813 Vitreous degeneration, bilateral: Secondary | ICD-10-CM | POA: Diagnosis not present

## 2019-03-09 DIAGNOSIS — H5203 Hypermetropia, bilateral: Secondary | ICD-10-CM | POA: Diagnosis not present

## 2019-03-09 DIAGNOSIS — H2513 Age-related nuclear cataract, bilateral: Secondary | ICD-10-CM | POA: Diagnosis not present

## 2019-03-09 DIAGNOSIS — H524 Presbyopia: Secondary | ICD-10-CM | POA: Diagnosis not present

## 2019-06-02 DIAGNOSIS — R69 Illness, unspecified: Secondary | ICD-10-CM | POA: Diagnosis not present

## 2019-07-31 DIAGNOSIS — E7800 Pure hypercholesterolemia, unspecified: Secondary | ICD-10-CM | POA: Diagnosis not present

## 2019-07-31 DIAGNOSIS — R7309 Other abnormal glucose: Secondary | ICD-10-CM | POA: Diagnosis not present

## 2019-07-31 DIAGNOSIS — K219 Gastro-esophageal reflux disease without esophagitis: Secondary | ICD-10-CM | POA: Diagnosis not present

## 2019-07-31 DIAGNOSIS — I10 Essential (primary) hypertension: Secondary | ICD-10-CM | POA: Diagnosis not present

## 2019-07-31 DIAGNOSIS — Z0000 Encounter for general adult medical examination without abnormal findings: Secondary | ICD-10-CM | POA: Diagnosis not present

## 2019-07-31 DIAGNOSIS — N529 Male erectile dysfunction, unspecified: Secondary | ICD-10-CM | POA: Diagnosis not present

## 2019-07-31 DIAGNOSIS — Z79899 Other long term (current) drug therapy: Secondary | ICD-10-CM | POA: Diagnosis not present

## 2019-07-31 DIAGNOSIS — Z1389 Encounter for screening for other disorder: Secondary | ICD-10-CM | POA: Diagnosis not present

## 2019-09-13 ENCOUNTER — Ambulatory Visit: Payer: Medicare Other | Attending: Internal Medicine

## 2019-09-13 DIAGNOSIS — Z23 Encounter for immunization: Secondary | ICD-10-CM

## 2019-09-13 NOTE — Progress Notes (Signed)
   Covid-19 Vaccination Clinic  Name:  Bryan Hays    MRN: [id number] DOB: 02/19/1946  09/13/2019  Bryan Hays was observed post Covid-19 immunization for 15 minutes without incidence. He was provided with Vaccine Information Sheet and instruction to access the V-Safe system.   Bryan Hays was instructed to call 911 with any severe reactions post vaccine: Marland Kitchen Difficulty breathing  . Swelling of your face and throat  . A fast heartbeat  . A bad rash all over your body  . Dizziness and weakness    Immunizations Administered    Name Date Dose VIS Date Route   Pfizer COVID-19 Vaccine 09/13/2019  5:45 PM 0.3 mL 08/04/2019 Intramuscular   Manufacturer: [place]   Lot: [id number]   [place]: [id number]

## 2019-10-04 ENCOUNTER — Ambulatory Visit: Payer: Medicare Other | Attending: Internal Medicine

## 2019-10-04 DIAGNOSIS — Z23 Encounter for immunization: Secondary | ICD-10-CM

## 2019-10-04 NOTE — Progress Notes (Signed)
   Covid-19 Vaccination Clinic  Name:  Bryan Hays    MRN: [id number] DOB: 19-Aug-1946  10/04/2019  [name] was observed post Covid-19 immunization for 15 minutes without incidence. He was provided with Vaccine Information Sheet and instruction to access the V-Safe system.   [name] was instructed to call 911 with any severe reactions post vaccine: Marland Kitchen Difficulty breathing  . Swelling of your face and throat  . A fast heartbeat  . A bad rash all over your body  . Dizziness and weakness    Immunizations Administered    Name Date Dose VIS Date Route   Pfizer COVID-19 Vaccine 10/04/2019  4:15 PM 0.3 mL 08/04/2019 Intramuscular   Manufacturer: [place]   Lot: [id number]   [place]: [id number]

## 2019-11-17 DIAGNOSIS — M545 Low back pain: Secondary | ICD-10-CM | POA: Diagnosis not present

## 2019-11-17 DIAGNOSIS — G8929 Other chronic pain: Secondary | ICD-10-CM | POA: Diagnosis not present

## 2020-01-29 DIAGNOSIS — J301 Allergic rhinitis due to pollen: Secondary | ICD-10-CM | POA: Diagnosis not present

## 2020-01-29 DIAGNOSIS — Z79899 Other long term (current) drug therapy: Secondary | ICD-10-CM | POA: Diagnosis not present

## 2020-01-29 DIAGNOSIS — I10 Essential (primary) hypertension: Secondary | ICD-10-CM | POA: Diagnosis not present

## 2020-01-29 DIAGNOSIS — M545 Low back pain: Secondary | ICD-10-CM | POA: Diagnosis not present

## 2020-03-12 DIAGNOSIS — H52203 Unspecified astigmatism, bilateral: Secondary | ICD-10-CM | POA: Diagnosis not present

## 2020-03-12 DIAGNOSIS — H2513 Age-related nuclear cataract, bilateral: Secondary | ICD-10-CM | POA: Diagnosis not present

## 2020-03-12 DIAGNOSIS — H5203 Hypermetropia, bilateral: Secondary | ICD-10-CM | POA: Diagnosis not present

## 2020-06-22 ENCOUNTER — Other Ambulatory Visit: Payer: Self-pay

## 2020-06-22 ENCOUNTER — Ambulatory Visit
Admission: EM | Admit: 2020-06-22 | Discharge: 2020-06-22 | Disposition: A | Discharge status: Home / Self Care | Payer: Medicare HMO | Attending: Physician Assistant | Admitting: Physician Assistant

## 2020-06-22 DIAGNOSIS — Z1152 Encounter for screening for COVID-19: Secondary | ICD-10-CM | POA: Diagnosis not present

## 2020-06-22 DIAGNOSIS — J0140 Acute pansinusitis, unspecified: Secondary | ICD-10-CM

## 2020-06-22 MED ORDER — PREDNISONE 50 MG PO TABS: 50.0000 mg | ORAL_TABLET | Freq: Every day | ORAL | 0 refills | Status: DC

## 2020-06-22 MED ORDER — DOXYCYCLINE HYCLATE 100 MG PO CAPS: 100.0000 mg | ORAL_CAPSULE | Freq: Two times a day (BID) | ORAL | 0 refills | Status: DC

## 2020-06-22 MED ORDER — IPRATROPIUM BROMIDE 0.06 % NA SOLN: 2.0000 | Freq: Four times a day (QID) | NASAL | 0 refills | Status: DC

## 2020-06-22 NOTE — ED Triage Notes (Signed)
Pt states he has been experiencing cough, nasal drainage, and left ear pain x 1 week. Pt has had his covid vaccinations. Pt is aox4 and ambulatory.

## 2020-06-22 NOTE — ED Provider Notes (Signed)
[facility]    CSN: [id number] Arrival date & time: 06/22/20  0950      History   Chief Complaint Chief Complaint  Patient presents with  . Cough    x 1 week  . Nasal Congestion    x 1 week    HPI Bryan Hays is a 74 y.o. male.   74 year old male comes in for 1 week history of URI symptoms. Cough, nasal congestion, post nasal drip, sore throat, body aches. Left ear pain this morning. Denies fever, chills. Denies abdominal pain, nausea, vomiting, diarrhea. Denies shortness of breath, loss of taste/smell. COVID vaccinated.      Past Medical History:  Diagnosis Date  . Allergic rhinitis due to pollen   . Degeneration of lumbar or lumbosacral intervertebral disc   . GERD (gastroesophageal reflux disease)   . Hyperlipidemia   . Personal history of urinary calculi   . Tendinitis     Patient Active Problem List   Diagnosis Date Noted  . Spondylolisthesis at L5-S1 level 07/28/2016  . Elevated blood pressure 04/27/2014  . Decreased sex drive 40/34/7425  . Routine health maintenance 02/21/2011  . HYPERLIPIDEMIA 11/12/2007  . ALLERGIC RHINITIS, SEASONAL 11/12/2007  . GASTROESOPHAGEAL REFLUX DISEASE 11/12/2007  . [place] DISEASE, LUMBAR 11/12/2007  . NEPHROLITHIASIS, HX OF 11/12/2007    Past Surgical History:  Procedure Laterality Date  . FOOT SURGERY     Hx plantar fasciitis  . SHOULDER ARTHROSCOPY  2012   right shoulder   . TONSILLECTOMY         Home Medications    Prior to Admission medications   Medication Sig Start Date End Date Taking? Authorizing Provider  cetirizine (ZYRTEC) 10 MG tablet Take 10 mg by mouth daily as needed for allergies.   Yes [provider]  esomeprazole (NEXIUM) 20 MG capsule Take 20 mg by mouth daily at 12 noon.   Yes [provider]  famotidine (PEPCID) 20 MG tablet Take 20 mg by mouth 2 (two) times daily.   Yes [provider]  guaiFENesin (TUSSIN CHEST CONGESTION PO) Take by mouth.   Yes  [provider]  lisinopril (PRINIVIL,ZESTRIL) 5 MG tablet Take 5 mg by mouth daily. 07/01/16  Yes [provider]  naproxen sodium (ANAPROX) 220 MG tablet Take 220 mg by mouth 2 (two) times daily as needed (for back pain.).   Yes [provider]  rosuvastatin (CRESTOR) 5 MG tablet TAKE 1 TABLET BY MOUTH EVERY DAY 08/22/14  Yes Hoyt Koch, MD  Acetaminophen (TYLENOL 8 HOUR PO) Take by mouth.    [provider]  aspirin 81 MG EC tablet Take 81 mg by mouth daily.      [provider]  DM-Doxylamine-Acetaminophen (NYQUIL COLD & FLU PO) Take by mouth.    [provider]  doxycycline (VIBRAMYCIN) 100 MG capsule Take 1 capsule (100 mg total) by mouth 2 (two) times daily. 06/22/20   Tasia Catchings, Vester Balthazor V, PA-C  fluticasone (FLONASE) 50 MCG/ACT nasal spray Place 2 sprays into both nostrils daily. 11/09/18   Shella Maxim, NP  ipratropium (ATROVENT) 0.06 % nasal spray Place 2 sprays into both nostrils 4 (four) times daily. 06/22/20   Tasia Catchings, Ryett Hamman V, PA-C  predniSONE (DELTASONE) 50 MG tablet Take 1 tablet (50 mg total) by mouth daily with breakfast. 06/22/20   Tasia Catchings, Brinlynn Gorton V, PA-C  azelastine (ASTELIN) 0.1 % nasal spray Place 1 spray into both nostrils 2 (two) times daily. Use in each nostril  as directed Patient not taking: Reported on 11/09/2018 08/20/18 06/22/20  Shella Maxim, NP    Family History Family History  Problem Relation Age of Onset  . COPD Father   . Coronary artery disease Father   . Heart disease Father        chf  . Hypertension Father   . Diabetes Father   . Other Father        Cdiff  . Heart disease Mother        cardiovascular disease  . Alzheimer's disease Mother   . Diabetes Mother   . Cancer Neg Hx        colon or prostate    Social History Social History   Tobacco Use  . Smoking status: Never Smoker  . Smokeless tobacco: Never Used  Vaping Use  . Vaping Use: Never used  Substance Use Topics  . Alcohol use: Yes     Comment: a occasional cold beer  . Drug use: No     Allergies   Banana and Sulfonamide derivatives   Review of Systems Review of Systems  Reason unable to perform ROS: See HPI as above.     Physical Exam Triage Vital Signs ED Triage Vitals  Enc Vitals Group     BP 06/22/20 1116 (!) 142/88     Pulse Rate 06/22/20 1116 83     Resp 06/22/20 1116 18     Temp 06/22/20 1116 98.1 F (36.7 C)     Temp Source 06/22/20 1116 Oral     SpO2 06/22/20 1116 96 %     Weight --      Height --      Head Circumference --      Peak Flow --      Pain Score 06/22/20 1117 4     Pain Loc --      Pain Edu? --      Excl. in [place]? --    No data found.  Updated Vital Signs BP (!) 142/88 (BP Location: Left Arm)   Pulse 83   Temp 98.1 F (36.7 C) (Oral)   Resp 18   SpO2 96%   Physical Exam Constitutional:      General: He is not in acute distress.    Appearance: He is well-developed. He is not ill-appearing, toxic-appearing or diaphoretic.  HENT:     Head: Normocephalic and atraumatic.     Right Ear: Ear canal and external ear normal. Tympanic membrane is not erythematous or bulging.     Left Ear: Ear canal and external ear normal. Tympanic membrane is not erythematous or bulging.     Ears:     Comments: Bilateral TM opaque    Nose:     Right Sinus: Maxillary sinus tenderness and frontal sinus tenderness present.     Left Sinus: Maxillary sinus tenderness and frontal sinus tenderness present.     Mouth/Throat:     Mouth: Mucous membranes are moist.     Pharynx: Oropharynx is clear. Uvula midline.  Eyes:     Conjunctiva/sclera: Conjunctivae normal.     Pupils: Pupils are equal, round, and reactive to light.  Cardiovascular:     Rate and Rhythm: Normal rate and regular rhythm.  Pulmonary:     Effort: Pulmonary effort is normal. No accessory muscle usage, prolonged expiration, respiratory distress or retractions.     Breath sounds: No decreased air movement or transmitted upper airway  sounds. No decreased breath sounds.     Comments: LCTAB  Musculoskeletal:     Cervical back: Normal range of motion and neck supple.  Skin:    General: Skin is warm and dry.  Neurological:     Mental Status: He is alert and oriented to person, place, and time.      UC Treatments / Results  Labs (all labs ordered are listed, but only abnormal results are displayed) Labs Reviewed  NOVEL CORONAVIRUS, NAA    EKG   Radiology No results found.  Procedures Procedures (including critical care time)  Medications Ordered in UC Medications - No data to display  Initial Impression / Assessment and Plan / UC Course  I have reviewed the triage vital signs and the nursing notes.  Pertinent labs & imaging results that were available during my care of the patient were reviewed by me and considered in my medical decision making (see chart for details).    COVID PCR test ordered. Patient to quarantine until testing results return. No alarming signs on exam. LCTAB. Prednisone and nasal sprays for now for sinusitis. If symptoms not improving, to start doxycycline to cover for bacterial sinusitis.  Push fluids.  Return precautions given.  Patient expresses understanding and agrees to plan.  Final Clinical Impressions(s) / UC Diagnoses   Final diagnoses:  Encounter for screening for COVID-19  Acute non-recurrent pansinusitis    ED Prescriptions    Medication Sig Dispense Auth. Provider   predniSONE (DELTASONE) 50 MG tablet Take 1 tablet (50 mg total) by mouth daily with breakfast. 5 tablet Kaeleb Emond V, PA-C   ipratropium (ATROVENT) 0.06 % nasal spray Place 2 sprays into both nostrils 4 (four) times daily. 15 mL Crispin Vogel V, PA-C   doxycycline (VIBRAMYCIN) 100 MG capsule Take 1 capsule (100 mg total) by mouth 2 (two) times daily. 14 capsule Ok Edwards, PA-C     PDMP not reviewed this encounter.   Ok Edwards, PA-C 06/22/20 1155

## 2020-06-22 NOTE — Discharge Instructions (Signed)
COVID PCR testing ordered. I would like you to quarantine until testing results. Start prednisone, atrovent and fonase nasal spray. If symptoms not improving in 2-3 days, start doxycycline to cover for bacterial sinus infection. Tylenol/motrin for pain and fever. Keep hydrated, urine should be clear to pale yellow in color. If experiencing shortness of breath, trouble breathing, go to the emergency department for further evaluation needed.

## 2020-06-23 ENCOUNTER — Telehealth: Payer: Self-pay

## 2020-06-23 LAB — SARS-COV-2, NAA 2 DAY TAT

## 2020-06-23 LAB — NOVEL CORONAVIRUS, NAA: SARS-CoV-2, NAA: NOT DETECTED

## 2020-06-23 MED ORDER — PREDNISONE 50 MG PO TABS: 50.0000 mg | ORAL_TABLET | Freq: Every day | ORAL | 0 refills | Status: DC

## 2020-07-01 ENCOUNTER — Other Ambulatory Visit: Payer: Self-pay | Admitting: Geriatric Medicine

## 2020-07-01 DIAGNOSIS — M545 Low back pain, unspecified: Secondary | ICD-10-CM

## 2020-07-07 ENCOUNTER — Ambulatory Visit
Admission: RE | Admit: 2020-07-07 | Discharge: 2020-07-07 | Disposition: A | Discharge status: Home / Self Care | Payer: Medicare HMO | Source: Ambulatory Visit | Attending: Geriatric Medicine | Admitting: Geriatric Medicine

## 2020-07-07 DIAGNOSIS — M545 Low back pain, unspecified: Secondary | ICD-10-CM

## 2020-07-07 DIAGNOSIS — M48061 Spinal stenosis, lumbar region without neurogenic claudication: Secondary | ICD-10-CM | POA: Diagnosis not present

## 2020-07-11 DIAGNOSIS — R69 Illness, unspecified: Secondary | ICD-10-CM | POA: Diagnosis not present

## 2020-07-17 DIAGNOSIS — M47816 Spondylosis without myelopathy or radiculopathy, lumbar region: Secondary | ICD-10-CM | POA: Diagnosis not present

## 2020-07-20 ENCOUNTER — Other Ambulatory Visit: Payer: Medicare HMO

## 2020-08-10 ENCOUNTER — Ambulatory Visit: Payer: Self-pay | Attending: Internal Medicine

## 2020-08-10 DIAGNOSIS — Z23 Encounter for immunization: Secondary | ICD-10-CM

## 2020-08-10 NOTE — Progress Notes (Signed)
   Covid-19 Vaccination Clinic  Name:  Deniz Hannan    MRN: [id number] DOB: [birth year]  08/10/2020  Mr. Cottier was observed post Covid-19 immunization for 15 minutes without incident. He was provided with Vaccine Information Sheet and instruction to access the V-Safe system.   Mr. Malinoski was instructed to call 911 with any severe reactions post vaccine: Marland Kitchen Difficulty breathing  . Swelling of face and throat  . A fast heartbeat  . A bad rash all over body  . Dizziness and weakness   Immunizations Administered    Name Date Dose VIS Date Route   Pfizer COVID-19 Vaccine 08/10/2020  9:25 AM 0.3 mL 06/12/2020 Intramuscular   Manufacturer: [place]   Lot: [id number]   [place]: [id number]

## 2020-08-26 DIAGNOSIS — I10 Essential (primary) hypertension: Secondary | ICD-10-CM | POA: Diagnosis not present

## 2020-08-26 DIAGNOSIS — R7303 Prediabetes: Secondary | ICD-10-CM | POA: Diagnosis not present

## 2020-08-26 DIAGNOSIS — Z1159 Encounter for screening for other viral diseases: Secondary | ICD-10-CM | POA: Diagnosis not present

## 2020-08-26 DIAGNOSIS — K9049 Malabsorption due to intolerance, not elsewhere classified: Secondary | ICD-10-CM | POA: Diagnosis not present

## 2020-08-26 DIAGNOSIS — Z79899 Other long term (current) drug therapy: Secondary | ICD-10-CM | POA: Diagnosis not present

## 2020-08-26 DIAGNOSIS — Z1389 Encounter for screening for other disorder: Secondary | ICD-10-CM | POA: Diagnosis not present

## 2020-08-26 DIAGNOSIS — K219 Gastro-esophageal reflux disease without esophagitis: Secondary | ICD-10-CM | POA: Diagnosis not present

## 2020-08-26 DIAGNOSIS — E7800 Pure hypercholesterolemia, unspecified: Secondary | ICD-10-CM | POA: Diagnosis not present

## 2020-08-26 DIAGNOSIS — Z23 Encounter for immunization: Secondary | ICD-10-CM | POA: Diagnosis not present

## 2020-08-26 DIAGNOSIS — Z0000 Encounter for general adult medical examination without abnormal findings: Secondary | ICD-10-CM | POA: Diagnosis not present

## 2020-08-26 DIAGNOSIS — R7309 Other abnormal glucose: Secondary | ICD-10-CM | POA: Diagnosis not present

## 2020-09-16 DIAGNOSIS — L821 Other seborrheic keratosis: Secondary | ICD-10-CM | POA: Diagnosis not present

## 2020-09-16 DIAGNOSIS — D485 Neoplasm of uncertain behavior of skin: Secondary | ICD-10-CM | POA: Diagnosis not present

## 2020-09-16 DIAGNOSIS — L918 Other hypertrophic disorders of the skin: Secondary | ICD-10-CM | POA: Diagnosis not present

## 2020-09-16 DIAGNOSIS — L814 Other melanin hyperpigmentation: Secondary | ICD-10-CM | POA: Diagnosis not present

## 2020-09-16 DIAGNOSIS — D1720 Benign lipomatous neoplasm of skin and subcutaneous tissue of unspecified limb: Secondary | ICD-10-CM | POA: Diagnosis not present

## 2020-09-16 DIAGNOSIS — L578 Other skin changes due to chronic exposure to nonionizing radiation: Secondary | ICD-10-CM | POA: Diagnosis not present

## 2020-09-16 DIAGNOSIS — D225 Melanocytic nevi of trunk: Secondary | ICD-10-CM | POA: Diagnosis not present

## 2020-09-16 DIAGNOSIS — E882 Lipomatosis, not elsewhere classified: Secondary | ICD-10-CM | POA: Diagnosis not present

## 2020-10-30 DIAGNOSIS — M25512 Pain in left shoulder: Secondary | ICD-10-CM | POA: Diagnosis not present

## 2020-11-19 DIAGNOSIS — M25512 Pain in left shoulder: Secondary | ICD-10-CM | POA: Diagnosis not present

## 2020-11-28 DIAGNOSIS — M25512 Pain in left shoulder: Secondary | ICD-10-CM | POA: Insufficient documentation

## 2020-11-29 DIAGNOSIS — M7542 Impingement syndrome of left shoulder: Secondary | ICD-10-CM | POA: Diagnosis not present

## 2020-12-26 DIAGNOSIS — M7542 Impingement syndrome of left shoulder: Secondary | ICD-10-CM | POA: Insufficient documentation

## 2020-12-30 DIAGNOSIS — M7542 Impingement syndrome of left shoulder: Secondary | ICD-10-CM | POA: Diagnosis not present

## 2020-12-30 DIAGNOSIS — R52 Pain, unspecified: Secondary | ICD-10-CM | POA: Diagnosis not present

## 2021-01-30 DIAGNOSIS — H10413 Chronic giant papillary conjunctivitis, bilateral: Secondary | ICD-10-CM | POA: Diagnosis not present

## 2021-01-30 DIAGNOSIS — T1512XA Foreign body in conjunctival sac, left eye, initial encounter: Secondary | ICD-10-CM | POA: Diagnosis not present

## 2021-02-25 DIAGNOSIS — I10 Essential (primary) hypertension: Secondary | ICD-10-CM | POA: Diagnosis not present

## 2021-03-13 DIAGNOSIS — H5203 Hypermetropia, bilateral: Secondary | ICD-10-CM | POA: Diagnosis not present

## 2021-03-13 DIAGNOSIS — H2513 Age-related nuclear cataract, bilateral: Secondary | ICD-10-CM | POA: Diagnosis not present

## 2021-04-04 DIAGNOSIS — K6289 Other specified diseases of anus and rectum: Secondary | ICD-10-CM | POA: Diagnosis not present

## 2021-06-13 DIAGNOSIS — M7542 Impingement syndrome of left shoulder: Secondary | ICD-10-CM | POA: Diagnosis not present

## 2021-07-25 DIAGNOSIS — Y999 Unspecified external cause status: Secondary | ICD-10-CM | POA: Diagnosis not present

## 2021-07-25 DIAGNOSIS — M7522 Bicipital tendinitis, left shoulder: Secondary | ICD-10-CM | POA: Diagnosis not present

## 2021-07-25 DIAGNOSIS — M75112 Incomplete rotator cuff tear or rupture of left shoulder, not specified as traumatic: Secondary | ICD-10-CM | POA: Diagnosis not present

## 2021-07-25 DIAGNOSIS — M7542 Impingement syndrome of left shoulder: Secondary | ICD-10-CM | POA: Diagnosis not present

## 2021-07-25 DIAGNOSIS — M7502 Adhesive capsulitis of left shoulder: Secondary | ICD-10-CM | POA: Diagnosis not present

## 2021-07-25 DIAGNOSIS — M19012 Primary osteoarthritis, left shoulder: Secondary | ICD-10-CM | POA: Diagnosis not present

## 2021-07-25 DIAGNOSIS — X58XXXA Exposure to other specified factors, initial encounter: Secondary | ICD-10-CM | POA: Diagnosis not present

## 2021-07-25 DIAGNOSIS — G8918 Other acute postprocedural pain: Secondary | ICD-10-CM | POA: Diagnosis not present

## 2021-07-25 DIAGNOSIS — M94212 Chondromalacia, left shoulder: Secondary | ICD-10-CM | POA: Diagnosis not present

## 2021-07-25 DIAGNOSIS — M67814 Other specified disorders of tendon, left shoulder: Secondary | ICD-10-CM | POA: Diagnosis not present

## 2021-07-25 DIAGNOSIS — S43432A Superior glenoid labrum lesion of left shoulder, initial encounter: Secondary | ICD-10-CM | POA: Diagnosis not present

## 2021-07-25 DIAGNOSIS — M659 Synovitis and tenosynovitis, unspecified: Secondary | ICD-10-CM | POA: Diagnosis not present

## 2021-07-28 DIAGNOSIS — M24012 Loose body in left shoulder: Secondary | ICD-10-CM | POA: Diagnosis not present

## 2021-07-28 DIAGNOSIS — I10 Essential (primary) hypertension: Secondary | ICD-10-CM | POA: Diagnosis not present

## 2021-07-28 DIAGNOSIS — I890 Lymphedema, not elsewhere classified: Secondary | ICD-10-CM | POA: Diagnosis not present

## 2021-07-28 DIAGNOSIS — Z4889 Encounter for other specified surgical aftercare: Secondary | ICD-10-CM | POA: Diagnosis not present

## 2021-07-28 DIAGNOSIS — M7522 Bicipital tendinitis, left shoulder: Secondary | ICD-10-CM | POA: Diagnosis not present

## 2021-07-28 DIAGNOSIS — M75102 Unspecified rotator cuff tear or rupture of left shoulder, not specified as traumatic: Secondary | ICD-10-CM | POA: Diagnosis not present

## 2021-07-28 DIAGNOSIS — Z791 Long term (current) use of non-steroidal anti-inflammatories (NSAID): Secondary | ICD-10-CM | POA: Diagnosis not present

## 2021-07-28 DIAGNOSIS — M7542 Impingement syndrome of left shoulder: Secondary | ICD-10-CM | POA: Diagnosis not present

## 2021-07-28 DIAGNOSIS — M25512 Pain in left shoulder: Secondary | ICD-10-CM | POA: Diagnosis not present

## 2021-07-28 DIAGNOSIS — E785 Hyperlipidemia, unspecified: Secondary | ICD-10-CM | POA: Diagnosis not present

## 2021-08-04 DIAGNOSIS — M25512 Pain in left shoulder: Secondary | ICD-10-CM | POA: Diagnosis not present

## 2021-08-12 DIAGNOSIS — M25512 Pain in left shoulder: Secondary | ICD-10-CM | POA: Diagnosis not present

## 2021-08-20 DIAGNOSIS — M25512 Pain in left shoulder: Secondary | ICD-10-CM | POA: Diagnosis not present

## 2021-08-27 DIAGNOSIS — M25512 Pain in left shoulder: Secondary | ICD-10-CM | POA: Diagnosis not present

## 2021-09-03 DIAGNOSIS — M25512 Pain in left shoulder: Secondary | ICD-10-CM | POA: Diagnosis not present

## 2021-09-05 DIAGNOSIS — Z6835 Body mass index (BMI) 35.0-35.9, adult: Secondary | ICD-10-CM | POA: Diagnosis not present

## 2021-09-05 DIAGNOSIS — R7303 Prediabetes: Secondary | ICD-10-CM | POA: Diagnosis not present

## 2021-09-05 DIAGNOSIS — K9089 Other intestinal malabsorption: Secondary | ICD-10-CM | POA: Diagnosis not present

## 2021-09-05 DIAGNOSIS — Z79899 Other long term (current) drug therapy: Secondary | ICD-10-CM | POA: Diagnosis not present

## 2021-09-05 DIAGNOSIS — K219 Gastro-esophageal reflux disease without esophagitis: Secondary | ICD-10-CM | POA: Diagnosis not present

## 2021-09-05 DIAGNOSIS — Z0000 Encounter for general adult medical examination without abnormal findings: Secondary | ICD-10-CM | POA: Diagnosis not present

## 2021-09-05 DIAGNOSIS — E559 Vitamin D deficiency, unspecified: Secondary | ICD-10-CM | POA: Diagnosis not present

## 2021-09-05 DIAGNOSIS — I10 Essential (primary) hypertension: Secondary | ICD-10-CM | POA: Diagnosis not present

## 2021-09-05 DIAGNOSIS — Z1389 Encounter for screening for other disorder: Secondary | ICD-10-CM | POA: Diagnosis not present

## 2021-09-10 DIAGNOSIS — M25512 Pain in left shoulder: Secondary | ICD-10-CM | POA: Diagnosis not present

## 2021-09-16 DIAGNOSIS — M25512 Pain in left shoulder: Secondary | ICD-10-CM | POA: Diagnosis not present

## 2021-09-18 DIAGNOSIS — M25512 Pain in left shoulder: Secondary | ICD-10-CM | POA: Diagnosis not present

## 2021-09-23 DIAGNOSIS — M25512 Pain in left shoulder: Secondary | ICD-10-CM | POA: Diagnosis not present

## 2021-09-25 DIAGNOSIS — M25512 Pain in left shoulder: Secondary | ICD-10-CM | POA: Diagnosis not present

## 2021-09-30 DIAGNOSIS — M25512 Pain in left shoulder: Secondary | ICD-10-CM | POA: Diagnosis not present

## 2021-10-02 DIAGNOSIS — M25512 Pain in left shoulder: Secondary | ICD-10-CM | POA: Diagnosis not present

## 2021-10-07 DIAGNOSIS — M25512 Pain in left shoulder: Secondary | ICD-10-CM | POA: Diagnosis not present

## 2021-10-10 DIAGNOSIS — H0100A Unspecified blepharitis right eye, upper and lower eyelids: Secondary | ICD-10-CM | POA: Diagnosis not present

## 2021-10-10 DIAGNOSIS — T1512XA Foreign body in conjunctival sac, left eye, initial encounter: Secondary | ICD-10-CM | POA: Diagnosis not present

## 2021-10-10 DIAGNOSIS — H02831 Dermatochalasis of right upper eyelid: Secondary | ICD-10-CM | POA: Diagnosis not present

## 2021-10-10 DIAGNOSIS — H0100B Unspecified blepharitis left eye, upper and lower eyelids: Secondary | ICD-10-CM | POA: Diagnosis not present

## 2021-10-15 DIAGNOSIS — M25512 Pain in left shoulder: Secondary | ICD-10-CM | POA: Diagnosis not present

## 2021-10-22 DIAGNOSIS — M25512 Pain in left shoulder: Secondary | ICD-10-CM | POA: Diagnosis not present

## 2021-10-29 DIAGNOSIS — M25512 Pain in left shoulder: Secondary | ICD-10-CM | POA: Diagnosis not present

## 2021-11-05 DIAGNOSIS — M25512 Pain in left shoulder: Secondary | ICD-10-CM | POA: Diagnosis not present

## 2022-03-06 DIAGNOSIS — I10 Essential (primary) hypertension: Secondary | ICD-10-CM | POA: Diagnosis not present

## 2022-03-06 DIAGNOSIS — Z79899 Other long term (current) drug therapy: Secondary | ICD-10-CM | POA: Diagnosis not present

## 2022-04-02 DIAGNOSIS — H5203 Hypermetropia, bilateral: Secondary | ICD-10-CM | POA: Diagnosis not present

## 2022-04-02 DIAGNOSIS — H31002 Unspecified chorioretinal scars, left eye: Secondary | ICD-10-CM | POA: Diagnosis not present

## 2022-04-02 DIAGNOSIS — H2513 Age-related nuclear cataract, bilateral: Secondary | ICD-10-CM | POA: Diagnosis not present

## 2022-05-15 DIAGNOSIS — M65312 Trigger thumb, left thumb: Secondary | ICD-10-CM | POA: Diagnosis not present

## 2022-06-08 ENCOUNTER — Other Ambulatory Visit: Payer: Self-pay | Admitting: Internal Medicine

## 2022-06-08 ENCOUNTER — Other Ambulatory Visit (HOSPITAL_COMMUNITY): Payer: Self-pay | Admitting: Internal Medicine

## 2022-06-08 DIAGNOSIS — R7303 Prediabetes: Secondary | ICD-10-CM | POA: Diagnosis not present

## 2022-06-08 DIAGNOSIS — R0602 Shortness of breath: Secondary | ICD-10-CM | POA: Diagnosis not present

## 2022-06-08 DIAGNOSIS — K219 Gastro-esophageal reflux disease without esophagitis: Secondary | ICD-10-CM | POA: Diagnosis not present

## 2022-06-08 DIAGNOSIS — R7309 Other abnormal glucose: Secondary | ICD-10-CM | POA: Diagnosis not present

## 2022-06-08 DIAGNOSIS — I10 Essential (primary) hypertension: Secondary | ICD-10-CM | POA: Diagnosis not present

## 2022-06-15 DIAGNOSIS — M65312 Trigger thumb, left thumb: Secondary | ICD-10-CM | POA: Diagnosis not present

## 2022-06-17 ENCOUNTER — Telehealth (HOSPITAL_COMMUNITY): Payer: Self-pay | Admitting: *Deleted

## 2022-06-17 NOTE — Telephone Encounter (Signed)
Patient given instructions for upcoming ETT. Bryan Hays

## 2022-06-19 DIAGNOSIS — R0602 Shortness of breath: Secondary | ICD-10-CM | POA: Diagnosis not present

## 2022-06-24 ENCOUNTER — Ambulatory Visit (HOSPITAL_COMMUNITY): Payer: Medicare HMO | Attending: Internal Medicine

## 2022-06-24 DIAGNOSIS — R0602 Shortness of breath: Secondary | ICD-10-CM | POA: Diagnosis not present

## 2022-06-24 LAB — EXERCISE TOLERANCE TEST
Angina Index: 0
Base ST Depression (mm): 0 mm
Duke Treadmill Score: 3
Estimated workload: 5
Exercise duration (min): 3 min
Exercise duration (sec): 22 s
MPHR: 145 {beats}/min
Peak HR: 153 {beats}/min
Percent HR: 105 %
Rest HR: 85 {beats}/min
ST Depression (mm): 0 mm

## 2022-07-13 DIAGNOSIS — T1512XA Foreign body in conjunctival sac, left eye, initial encounter: Secondary | ICD-10-CM | POA: Diagnosis not present

## 2022-07-28 ENCOUNTER — Other Ambulatory Visit: Payer: Self-pay

## 2022-07-30 ENCOUNTER — Other Ambulatory Visit: Payer: Self-pay

## 2022-09-02 ENCOUNTER — Ambulatory Visit
Admission: RE | Admit: 2022-09-02 | Discharge: 2022-09-02 | Disposition: A | Discharge status: Home / Self Care | Payer: No Typology Code available for payment source | Source: Ambulatory Visit | Attending: Internal Medicine | Admitting: Internal Medicine

## 2022-09-02 DIAGNOSIS — I2510 Atherosclerotic heart disease of native coronary artery without angina pectoris: Secondary | ICD-10-CM | POA: Diagnosis not present

## 2022-09-02 DIAGNOSIS — R0602 Shortness of breath: Secondary | ICD-10-CM

## 2022-09-15 DIAGNOSIS — R0602 Shortness of breath: Secondary | ICD-10-CM | POA: Diagnosis not present

## 2022-09-15 DIAGNOSIS — R931 Abnormal findings on diagnostic imaging of heart and coronary circulation: Secondary | ICD-10-CM | POA: Diagnosis not present

## 2022-09-19 DIAGNOSIS — M65312 Trigger thumb, left thumb: Secondary | ICD-10-CM | POA: Diagnosis not present

## 2022-09-24 ENCOUNTER — Telehealth: Payer: Self-pay | Admitting: *Deleted

## 2022-09-24 NOTE — Telephone Encounter (Signed)
Patient is scheduled to establish with Dr. Stanford Breed as a new patient.  Will defer final clearance to MD.

## 2022-09-24 NOTE — Telephone Encounter (Signed)
   Pre-operative Risk Assessment    Patient Name: Bryan Hays  DOB: Dec 17, 1945 MRN: [id number]      Request for Surgical Clearance    Procedure:   left thumb trigger finger release  Date of Surgery:  Clearance 10/29/22                                 Surgeon:  Dr. Melrose Nakayama Surgeon's Group or Practice Name:  [facility] Phone number:  [phone] Fax number:  [phone] attn: Marlin Canary   Type of Clearance Requested:   - Medical    Type of Anesthesia:   Choice   Additional requests/questions:  Please advise surgeon/provider what medications should be held.  Signed, Silverio Lay   09/24/2022, 1:22 PM

## 2022-09-30 NOTE — Progress Notes (Signed)
Referring-Hal Stoneking, MD Reason for referral-elevated calcium score and preoperative evaluation prior to repair of left thumb trigger finger release.  HPI: 77 year old male for evaluation of elevated calcium score and preoperative risk stratification prior to repair of left thumb trigger finger release at request of Lajean Manes, MD.  Exercise treadmill November 2023-patient exercised for 3 minutes and 22 seconds achieving a maximum heart rate of 153, no chest pain, normal blood pressure response, no ST changes.  Calcium score January 2024 3834 which was 96 percentile; left main 280, LAD 1264, RCA 2136 and circumflex 154.  Patient states that over the past year he has had progressive dyspnea on exertion which is new.  There is no orthopnea, PND, pedal edema or syncope.  He also has chest tightness at peak activities relieved with rest.  Cardiology now asked to evaluate.  Current Outpatient Medications  Medication Sig Dispense Refill   Acetaminophen (TYLENOL 8 HOUR PO) Take by mouth.     aspirin 81 MG EC tablet Take 81 mg by mouth daily.       Cholecalciferol 50 MCG (2000 UT) CAPS 1 capsule Orally Once a day for 30 day(s)     esomeprazole (NEXIUM) 20 MG capsule Take 20 mg by mouth daily at 12 noon.     lisinopril (PRINIVIL,ZESTRIL) 5 MG tablet Take 5 mg by mouth daily.  0   rosuvastatin (CRESTOR) 5 MG tablet TAKE 1 TABLET BY MOUTH EVERY DAY 30 tablet 3   cetirizine (ZYRTEC) 10 MG tablet Take 10 mg by mouth daily as needed for allergies. (Patient not taking: Reported on 10/14/2022)     DM-Doxylamine-Acetaminophen (NYQUIL COLD & FLU PO) Take by mouth. (Patient not taking: Reported on 10/14/2022)     doxycycline (VIBRAMYCIN) 100 MG capsule Take 1 capsule (100 mg total) by mouth 2 (two) times daily. (Patient not taking: Reported on 10/14/2022) 14 capsule 0   famotidine (PEPCID) 20 MG tablet Take 20 mg by mouth 2 (two) times daily. (Patient not taking: Reported on 10/14/2022)     fluticasone  (FLONASE) 50 MCG/ACT nasal spray Place 2 sprays into both nostrils daily. (Patient not taking: Reported on 10/14/2022) 16 g 0   guaiFENesin (TUSSIN CHEST CONGESTION PO) Take by mouth. (Patient not taking: Reported on 10/14/2022)     ipratropium (ATROVENT) 0.06 % nasal spray Place 2 sprays into both nostrils 4 (four) times daily. (Patient not taking: Reported on 10/14/2022) 15 mL 0   naproxen sodium (ANAPROX) 220 MG tablet Take 220 mg by mouth 2 (two) times daily as needed (for back pain.). (Patient not taking: Reported on 10/14/2022)     predniSONE (DELTASONE) 50 MG tablet Take 1 tablet (50 mg total) by mouth daily with breakfast. (Patient not taking: Reported on 10/14/2022) 5 tablet 0   No current facility-administered medications for this visit.    Allergies  Allergen Reactions   Banana Nausea And Vomiting    G.I. UPSET   Sulfonamide Derivatives Nausea And Vomiting     Past Medical History:  Diagnosis Date   Allergic rhinitis due to pollen    CAD (coronary artery disease)    Degeneration of lumbar or lumbosacral intervertebral disc    GERD (gastroesophageal reflux disease)    Hyperlipidemia    Hypertension    Nephrolithiasis    Personal history of urinary calculi    Tendinitis     Past Surgical History:  Procedure Laterality Date   back surgery     FOOT SURGERY  Hx plantar fasciitis   SHOULDER ARTHROSCOPY  08/24/2010   right shoulder    TONSILLECTOMY      Social History   Socioeconomic History   Marital status: Married    Spouse name: Not on file   Number of children: 2   Years of education: 16   Highest education level: Not on file  Occupational History   Occupation: [employer]    Employer: RETIRED    Comment: fiberoptic switches, 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 1 son -'22, daughter -'60; 2 grandsons, 3 step grandchildren. Retired from  [employer]. Enjoying his retirement. 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 End of Life care: provided packet.    Social Determinants of Health   Financial Resource Strain: Not on file  Food Insecurity: Not on file  Transportation Needs: Not on file  Physical Activity: Not on file  Stress: Not on file  Social Connections: Not on file  Intimate Partner Violence: Not on file    Family History  Problem Relation Age of Onset   Heart disease Mother        cardiovascular disease   Alzheimer's disease Mother    Diabetes Mother    Heart attack Father    COPD Father    Coronary artery disease Father    Heart disease Father        chf   Hypertension Father    Diabetes Father    Other Father        Cdiff   Cancer Neg Hx        colon or prostate    ROS: no fevers or chills, productive cough, hemoptysis, dysphasia, odynophagia, melena, hematochezia, dysuria, hematuria, rash, seizure activity, orthopnea, PND, pedal edema, claudication. Remaining systems are negative.  Physical Exam:   Blood pressure 138/78, pulse 77, height 5' 6"$  (1.676 m), weight 227 lb 6.4 oz (103.1 kg).  General:  Well developed/well nourished in NAD Skin warm/dry Patient not depressed No peripheral clubbing Back-normal HEENT-normal/normal eyelids Neck supple/normal carotid upstroke bilaterally; no bruits; no JVD; no thyromegaly chest - CTA/ normal expansion CV - RRR/normal S1 and S2; no murmurs, rubs or gallops;  PMI nondisplaced Abdomen -NT/ND, no HSM, no mass, + bowel sounds, no bruit 2+ femoral pulses, no bruits Ext-no edema, chords, 2+ DP Neuro-grossly nonfocal  ECG -normal  sinus rhythm at a rate of 77, left axis deviation, no ST changes.  Personally reviewed  A/P  1 dyspnea/chest tightness-patient has noted increased dyspnea on exertion and occasional chest tightness with vigorous activities relieved with rest.  His calcium score is significantly elevated.  Previous treadmill unrevealing but he was only able to exercise 3 minutes and 22 seconds.  I feel definitive evaluation is warranted.  Plan cardiac catheterization.  The risk and benefits including myocardial infarction, CVA and death discussed and he agrees to proceed.  Continue aspirin, statin.  Add Toprol 25 mg  daily.  2 coronary artery disease-based on severely elevated calcium score.  Continue aspirin and statin.  3 preoperative evaluation prior to repair of left thumb trigger release-will await results of catheterization and question need for revascularization prior to proceeding with any surgery.  4 hyperlipidemia-given documented coronary artery disease I will increase Crestor to 40 mg daily.  Check lipids and liver in 8 weeks.  5 hypertension-patient's blood pressure is mildly elevated.  I am adding Toprol and he will follow his blood pressure and we will increase medications as needed.  Kirk Ruths, MD

## 2022-09-30 NOTE — H&P (View-Only) (Signed)
Referring-Hal Stoneking, MD Reason for referral-elevated calcium score and preoperative evaluation prior to repair of left thumb trigger finger release.  HPI: 77 year old male for evaluation of elevated calcium score and preoperative risk stratification prior to repair of left thumb trigger finger release at request of Lajean Manes, MD.  Exercise treadmill November 2023-patient exercised for 3 minutes and 22 seconds achieving a maximum heart rate of 153, no chest pain, normal blood pressure response, no ST changes.  Calcium score January 2024 3834 which was 96 percentile; left main 280, LAD 1264, RCA 2136 and circumflex 154.  Patient states that over the past year he has had progressive dyspnea on exertion which is new.  There is no orthopnea, PND, pedal edema or syncope.  He also has chest tightness at peak activities relieved with rest.  Cardiology now asked to evaluate.  Current Outpatient Medications  Medication Sig Dispense Refill   Acetaminophen (TYLENOL 8 HOUR PO) Take by mouth.     aspirin 81 MG EC tablet Take 81 mg by mouth daily.       Cholecalciferol 50 MCG (2000 UT) CAPS 1 capsule Orally Once a day for 30 day(s)     esomeprazole (NEXIUM) 20 MG capsule Take 20 mg by mouth daily at 12 noon.     lisinopril (PRINIVIL,ZESTRIL) 5 MG tablet Take 5 mg by mouth daily.  0   rosuvastatin (CRESTOR) 5 MG tablet TAKE 1 TABLET BY MOUTH EVERY DAY 30 tablet 3   cetirizine (ZYRTEC) 10 MG tablet Take 10 mg by mouth daily as needed for allergies. (Patient not taking: Reported on 10/14/2022)     DM-Doxylamine-Acetaminophen (NYQUIL COLD & FLU PO) Take by mouth. (Patient not taking: Reported on 10/14/2022)     doxycycline (VIBRAMYCIN) 100 MG capsule Take 1 capsule (100 mg total) by mouth 2 (two) times daily. (Patient not taking: Reported on 10/14/2022) 14 capsule 0   famotidine (PEPCID) 20 MG tablet Take 20 mg by mouth 2 (two) times daily. (Patient not taking: Reported on 10/14/2022)     fluticasone  (FLONASE) 50 MCG/ACT nasal spray Place 2 sprays into both nostrils daily. (Patient not taking: Reported on 10/14/2022) 16 g 0   guaiFENesin (TUSSIN CHEST CONGESTION PO) Take by mouth. (Patient not taking: Reported on 10/14/2022)     ipratropium (ATROVENT) 0.06 % nasal spray Place 2 sprays into both nostrils 4 (four) times daily. (Patient not taking: Reported on 10/14/2022) 15 mL 0   naproxen sodium (ANAPROX) 220 MG tablet Take 220 mg by mouth 2 (two) times daily as needed (for back pain.). (Patient not taking: Reported on 10/14/2022)     predniSONE (DELTASONE) 50 MG tablet Take 1 tablet (50 mg total) by mouth daily with breakfast. (Patient not taking: Reported on 10/14/2022) 5 tablet 0   No current facility-administered medications for this visit.    Allergies  Allergen Reactions   Banana Nausea And Vomiting    G.I. UPSET   Sulfonamide Derivatives Nausea And Vomiting     Past Medical History:  Diagnosis Date   Allergic rhinitis due to pollen    CAD (coronary artery disease)    Degeneration of lumbar or lumbosacral intervertebral disc    GERD (gastroesophageal reflux disease)    Hyperlipidemia    Hypertension    Nephrolithiasis    Personal history of urinary calculi    Tendinitis     Past Surgical History:  Procedure Laterality Date   back surgery     FOOT SURGERY  Hx plantar fasciitis   SHOULDER ARTHROSCOPY  08/24/2010   right shoulder    TONSILLECTOMY      Social History   Socioeconomic History   Marital status: Married    Spouse name: Not on file   Number of children: 2   Years of education: 16   Highest education level: Not on file  Occupational History   Occupation: [employer]    Employer: RETIRED    Comment: fiberoptic switches, 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 1 son -'83, daughter -'72; 2 grandsons, 3 step grandchildren. Retired from  [employer]. Enjoying his retirement. 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 End of Life care: provided packet.    Social Determinants of Health   Financial Resource Strain: Not on file  Food Insecurity: Not on file  Transportation Needs: Not on file  Physical Activity: Not on file  Stress: Not on file  Social Connections: Not on file  Intimate Partner Violence: Not on file    Family History  Problem Relation Age of Onset   Heart disease Mother        cardiovascular disease   Alzheimer's disease Mother    Diabetes Mother    Heart attack Father    COPD Father    Coronary artery disease Father    Heart disease Father        chf   Hypertension Father    Diabetes Father    Other Father        Cdiff   Cancer Neg Hx        colon or prostate    ROS: no fevers or chills, productive cough, hemoptysis, dysphasia, odynophagia, melena, hematochezia, dysuria, hematuria, rash, seizure activity, orthopnea, PND, pedal edema, claudication. Remaining systems are negative.  Physical Exam:   Blood pressure 138/78, pulse 77, height 5' 6"$  (1.676 m), weight 227 lb 6.4 oz (103.1 kg).  General:  Well developed/well nourished in NAD Skin warm/dry Patient not depressed No peripheral clubbing Back-normal HEENT-normal/normal eyelids Neck supple/normal carotid upstroke bilaterally; no bruits; no JVD; no thyromegaly chest - CTA/ normal expansion CV - RRR/normal S1 and S2; no murmurs, rubs or gallops;  PMI nondisplaced Abdomen -NT/ND, no HSM, no mass, + bowel sounds, no bruit 2+ femoral pulses, no bruits Ext-no edema, chords, 2+ DP Neuro-grossly nonfocal  ECG -normal  sinus rhythm at a rate of 77, left axis deviation, no ST changes.  Personally reviewed  A/P  1 dyspnea/chest tightness-patient has noted increased dyspnea on exertion and occasional chest tightness with vigorous activities relieved with rest.  His calcium score is significantly elevated.  Previous treadmill unrevealing but he was only able to exercise 3 minutes and 22 seconds.  I feel definitive evaluation is warranted.  Plan cardiac catheterization.  The risk and benefits including myocardial infarction, CVA and death discussed and he agrees to proceed.  Continue aspirin, statin.  Add Toprol 25 mg  daily.  2 coronary artery disease-based on severely elevated calcium score.  Continue aspirin and statin.  3 preoperative evaluation prior to repair of left thumb trigger release-will await results of catheterization and question need for revascularization prior to proceeding with any surgery.  4 hyperlipidemia-given documented coronary artery disease I will increase Crestor to 40 mg daily.  Check lipids and liver in 8 weeks.  5 hypertension-patient's blood pressure is mildly elevated.  I am adding Toprol and he will follow his blood pressure and we will increase medications as needed.  Kirk Ruths, MD

## 2022-10-14 ENCOUNTER — Ambulatory Visit: Payer: Medicare HMO | Attending: Cardiology | Admitting: Cardiology

## 2022-10-14 ENCOUNTER — Encounter: Payer: Self-pay | Admitting: Cardiology

## 2022-10-14 VITALS — BP 138/78 | HR 77 | Ht 66.0 in | Wt 227.4 lb

## 2022-10-14 DIAGNOSIS — E7800 Pure hypercholesterolemia, unspecified: Secondary | ICD-10-CM

## 2022-10-14 DIAGNOSIS — R0609 Other forms of dyspnea: Secondary | ICD-10-CM

## 2022-10-14 DIAGNOSIS — I10 Essential (primary) hypertension: Secondary | ICD-10-CM

## 2022-10-14 DIAGNOSIS — R072 Precordial pain: Secondary | ICD-10-CM

## 2022-10-14 DIAGNOSIS — I2510 Atherosclerotic heart disease of native coronary artery without angina pectoris: Secondary | ICD-10-CM | POA: Diagnosis not present

## 2022-10-14 LAB — BASIC METABOLIC PANEL
BUN/Creatinine Ratio: 18 (ref 10–24)
BUN: 17 mg/dL (ref 8–27)
CO2: 29 mmol/L (ref 20–29)
Calcium: 9.6 mg/dL (ref 8.6–10.2)
Chloride: 101 mmol/L (ref 96–106)
Creatinine, Ser: 0.96 mg/dL (ref 0.76–1.27)
Glucose: 110 mg/dL — ABNORMAL HIGH (ref 70–99)
Potassium: 4.5 mmol/L (ref 3.5–5.2)
Sodium: 139 mmol/L (ref 134–144)
eGFR: 82 mL/min/{1.73_m2} (ref >59)

## 2022-10-14 LAB — CBC
Hematocrit: 43.8 % (ref 37.5–51.0)
Hemoglobin: 14.7 g/dL (ref 13.0–17.7)
MCH: 30.7 pg (ref 26.6–33.0)
MCHC: 33.6 g/dL (ref 31.5–35.7)
MCV: 91 fL (ref 79–97)
Platelets: 255 10*3/uL (ref 150–450)
RBC: 4.79 x10E6/uL (ref 4.14–5.80)
RDW: 15.1 % (ref 11.6–15.4)
WBC: 8.6 10*3/uL (ref 3.4–10.8)

## 2022-10-14 MED ORDER — METOPROLOL SUCCINATE ER 25 MG PO TB24: 25.0000 mg | ORAL_TABLET | Freq: Every day | ORAL | 3 refills | Status: DC

## 2022-10-14 MED ORDER — ROSUVASTATIN CALCIUM 40 MG PO TABS: 40.0000 mg | ORAL_TABLET | Freq: Every day | ORAL | 3 refills | Status: DC

## 2022-10-14 MED ORDER — SODIUM CHLORIDE 0.9% FLUSH: 3.0000 mL | Freq: Two times a day (BID) | INTRAVENOUS | Status: DC

## 2022-10-14 NOTE — Patient Instructions (Signed)
Medication Instructions:   START METOPROLOL SUCC ER 25 MG ONCE DAILY AT BEDTIME  INCREASE ROSUVASTATIN TO 40 MG ONCE DAILY=8 OF THE 5 MG TABLETS ONCE DAILY  *If you need a refill on your cardiac medications before your next appointment, please call your pharmacy*         Cardiac Catheterization   You are scheduled for a Cardiac Catheterization on Thursday, February 22 with Dr. Lenna Sciara.  1. Please arrive at the Main Entrance A at [facility]: [place], [place] [address] on February 22 at 10:00 AM (This time is two hours before your procedure to ensure your preparation). Free valet parking service is available. You will check in at ADMITTING. The support person will be asked to wait in the waiting room.  It is OK to have someone drop you off and come back when you are ready to be discharged.        Special note: Every effort is made to have your procedure done on time. Please understand that emergencies sometimes delay scheduled procedures.   . 2. Diet: Do not eat solid foods after midnight.  You may have clear liquids until 5 AM the day of the procedure.  3. Labs: You will need to have blood drawn on TODAY  4. Medication instructions in preparation for your procedure:   On the morning of your procedure, take Aspirin 81 mg and any morning medicines.  You may use sips of water.  5. Plan to go home the same day, you will only stay overnight if medically necessary. 6. You MUST have a responsible adult to drive you home. 7. An adult MUST be with you the first 24 hours after you arrive home. 8. Bring a current list of your medications, and the last time and date medication taken. 9. Bring ID and current insurance cards. 10.Please wear clothes that are easy to get on and off and wear slip-on shoes.  Thank you for allowing [place] to care for you!   -- [facility]    Follow-Up: At [facility], you and your health  needs are our priority.  As part of our continuing mission to provide you with exceptional heart care, we have created designated Provider Care Teams.  These Care Teams include your primary Cardiologist (physician) and Advanced Practice Providers (APPs -  Physician Assistants and Nurse Practitioners) who all work together to provide you with the care you need, when you need it.  We recommend signing up for the patient portal called "MyChart".  Sign up information is provided on this After Visit Summary.  MyChart is used to connect with patients for Virtual Visits (Telemedicine).  Patients are able to view lab/test results, encounter notes, upcoming appointments, etc.  Non-urgent messages can be sent to your provider as well.   To learn more about what you can do with MyChart, go to [email or web address].    Your next appointment:   3 month(s)  Provider:   Kirk Ruths MD

## 2022-10-15 ENCOUNTER — Ambulatory Visit (HOSPITAL_COMMUNITY)
Admission: RE | Disposition: A | Discharge status: Home / Self Care | Payer: Self-pay | Source: Ambulatory Visit | Attending: Internal Medicine

## 2022-10-15 ENCOUNTER — Observation Stay (HOSPITAL_COMMUNITY)
Admission: RE | Admit: 2022-10-15 | Discharge: 2022-10-16 | Disposition: A | Discharge status: Home / Self Care | Payer: Medicare HMO | Source: Ambulatory Visit | Attending: Internal Medicine | Admitting: Internal Medicine

## 2022-10-15 ENCOUNTER — Other Ambulatory Visit: Payer: Self-pay

## 2022-10-15 DIAGNOSIS — R072 Precordial pain: Secondary | ICD-10-CM

## 2022-10-15 DIAGNOSIS — Z7982 Long term (current) use of aspirin: Secondary | ICD-10-CM | POA: Insufficient documentation

## 2022-10-15 DIAGNOSIS — Z955 Presence of coronary angioplasty implant and graft: Secondary | ICD-10-CM

## 2022-10-15 DIAGNOSIS — E785 Hyperlipidemia, unspecified: Secondary | ICD-10-CM | POA: Diagnosis present

## 2022-10-15 DIAGNOSIS — Z79899 Other long term (current) drug therapy: Secondary | ICD-10-CM | POA: Insufficient documentation

## 2022-10-15 DIAGNOSIS — I10 Essential (primary) hypertension: Secondary | ICD-10-CM | POA: Insufficient documentation

## 2022-10-15 DIAGNOSIS — Z8249 Family history of ischemic heart disease and other diseases of the circulatory system: Secondary | ICD-10-CM | POA: Insufficient documentation

## 2022-10-15 DIAGNOSIS — E7800 Pure hypercholesterolemia, unspecified: Secondary | ICD-10-CM

## 2022-10-15 DIAGNOSIS — Z9861 Coronary angioplasty status: Secondary | ICD-10-CM

## 2022-10-15 DIAGNOSIS — I2584 Coronary atherosclerosis due to calcified coronary lesion: Secondary | ICD-10-CM | POA: Insufficient documentation

## 2022-10-15 DIAGNOSIS — I2510 Atherosclerotic heart disease of native coronary artery without angina pectoris: Principal | ICD-10-CM | POA: Diagnosis present

## 2022-10-15 DIAGNOSIS — K219 Gastro-esophageal reflux disease without esophagitis: Secondary | ICD-10-CM | POA: Diagnosis present

## 2022-10-15 DIAGNOSIS — R0609 Other forms of dyspnea: Secondary | ICD-10-CM

## 2022-10-15 HISTORY — PX: CORONARY ATHERECTOMY: CATH118238

## 2022-10-15 HISTORY — PX: CORONARY LITHOTRIPSY: CATH118330

## 2022-10-15 HISTORY — PX: INTRAVASCULAR PRESSURE WIRE/FFR STUDY: CATH118243

## 2022-10-15 HISTORY — PX: CORONARY STENT INTERVENTION: CATH118234

## 2022-10-15 HISTORY — PX: INTRAVASCULAR IMAGING/OCT: CATH118326

## 2022-10-15 HISTORY — PX: LEFT HEART CATH AND CORONARY ANGIOGRAPHY: CATH118249

## 2022-10-15 LAB — POCT ACTIVATED CLOTTING TIME
Activated Clotting Time: 244 seconds
Activated Clotting Time: 390 seconds
Activated Clotting Time: 217 seconds
Activated Clotting Time: 412 seconds
Activated Clotting Time: 271 seconds

## 2022-10-15 SURGERY — LEFT HEART CATH AND CORONARY ANGIOGRAPHY
Anesthesia: LOCAL

## 2022-10-15 MED ORDER — VERAPAMIL HCL 2.5 MG/ML IV SOLN
INTRAVENOUS | Status: DC | PRN
  Administered 2022-10-15: 10 mL via INTRA_ARTERIAL

## 2022-10-15 MED ORDER — FENTANYL CITRATE (PF) 100 MCG/2ML IJ SOLN
INTRAMUSCULAR | Status: DC | PRN
  Administered 2022-10-15 (×3): 25 ug via INTRAVENOUS
  Administered 2022-10-15: 50 ug via INTRAVENOUS
  Administered 2022-10-15: 25 ug via INTRAVENOUS

## 2022-10-15 MED ORDER — VIPERSLIDE LUBRICANT OPTIME
TOPICAL | Status: DC | PRN
  Administered 2022-10-15: 20 mL via SURGICAL_CAVITY

## 2022-10-15 MED ORDER — SODIUM CHLORIDE 0.9 % IV SOLN: 250.0000 mL | INTRAVENOUS | Status: DC | PRN

## 2022-10-15 MED ORDER — VERAPAMIL HCL 2.5 MG/ML IV SOLN
INTRAVENOUS | Status: AC
  Filled 2022-10-15: qty 2

## 2022-10-15 MED ORDER — CLOPIDOGREL BISULFATE 75 MG PO TABS: 75.0000 mg | ORAL_TABLET | Freq: Every day | ORAL | Status: DC

## 2022-10-15 MED ORDER — ASPIRIN 81 MG PO CHEW: 81.0000 mg | CHEWABLE_TABLET | ORAL | Status: DC

## 2022-10-15 MED ORDER — LABETALOL HCL 5 MG/ML IV SOLN: 10.0000 mg | INTRAVENOUS | Status: AC | PRN

## 2022-10-15 MED ORDER — SODIUM CHLORIDE 0.9% FLUSH: 3.0000 mL | INTRAVENOUS | Status: DC | PRN

## 2022-10-15 MED ORDER — LABETALOL HCL 5 MG/ML IV SOLN: 10.0000 mg | INTRAVENOUS | Status: DC | PRN

## 2022-10-15 MED ORDER — ASPIRIN 81 MG PO CHEW: 81.0000 mg | CHEWABLE_TABLET | Freq: Every day | ORAL | Status: DC

## 2022-10-15 MED ORDER — HEPARIN SODIUM (PORCINE) 1000 UNIT/ML IJ SOLN
INTRAMUSCULAR | Status: AC
  Filled 2022-10-15: qty 10

## 2022-10-15 MED ORDER — ACETAMINOPHEN 325 MG PO TABS: 650.0000 mg | ORAL_TABLET | ORAL | Status: DC | PRN

## 2022-10-15 MED ORDER — FENTANYL CITRATE (PF) 100 MCG/2ML IJ SOLN
INTRAMUSCULAR | Status: AC
  Filled 2022-10-15: qty 2

## 2022-10-15 MED ORDER — LIDOCAINE HCL (PF) 1 % IJ SOLN
INTRAMUSCULAR | Status: AC
  Filled 2022-10-15: qty 30

## 2022-10-15 MED ORDER — ONDANSETRON HCL 4 MG/2ML IJ SOLN: 4.0000 mg | Freq: Four times a day (QID) | INTRAMUSCULAR | Status: DC | PRN

## 2022-10-15 MED ORDER — SODIUM CHLORIDE 0.9 % IV SOLN: INTRAVENOUS | Status: AC

## 2022-10-15 MED ORDER — HEPARIN SODIUM (PORCINE) 1000 UNIT/ML IJ SOLN
INTRAMUSCULAR | Status: DC | PRN
  Administered 2022-10-15: 1000 [IU] via INTRAVENOUS
  Administered 2022-10-15: 3000 [IU] via INTRAVENOUS
  Administered 2022-10-15 (×2): 5000 [IU] via INTRAVENOUS
  Administered 2022-10-15: 1000 [IU] via INTRAVENOUS

## 2022-10-15 MED ORDER — ASPIRIN 81 MG PO CHEW
81.0000 mg | CHEWABLE_TABLET | Freq: Every day | ORAL | Status: DC
  Administered 2022-10-16: 81 mg via ORAL
  Filled 2022-10-15: qty 1

## 2022-10-15 MED ORDER — CLOPIDOGREL BISULFATE 300 MG PO TABS
ORAL_TABLET | ORAL | Status: AC
  Filled 2022-10-15: qty 1

## 2022-10-15 MED ORDER — MIDAZOLAM HCL 2 MG/2ML IJ SOLN
INTRAMUSCULAR | Status: AC
  Filled 2022-10-15: qty 2

## 2022-10-15 MED ORDER — HEPARIN (PORCINE) IN NACL 1000-0.9 UT/500ML-% IV SOLN
INTRAVENOUS | Status: DC | PRN
  Administered 2022-10-15 (×5): 500 mL

## 2022-10-15 MED ORDER — SODIUM CHLORIDE 0.9% FLUSH: 3.0000 mL | Freq: Two times a day (BID) | INTRAVENOUS | Status: DC

## 2022-10-15 MED ORDER — HYDRALAZINE HCL 20 MG/ML IJ SOLN: 10.0000 mg | INTRAMUSCULAR | Status: DC | PRN

## 2022-10-15 MED ORDER — SODIUM CHLORIDE 0.9 % WEIGHT BASED INFUSION
3.0000 mL/kg/h | INTRAVENOUS | Status: DC
  Administered 2022-10-15: 3 mL/kg/h via INTRAVENOUS

## 2022-10-15 MED ORDER — NITROGLYCERIN 1 MG/10 ML FOR IR/CATH LAB
INTRA_ARTERIAL | Status: AC
  Filled 2022-10-15: qty 10

## 2022-10-15 MED ORDER — HYDRALAZINE HCL 20 MG/ML IJ SOLN: 10.0000 mg | INTRAMUSCULAR | Status: AC | PRN

## 2022-10-15 MED ORDER — SODIUM CHLORIDE 0.9% FLUSH
3.0000 mL | Freq: Two times a day (BID) | INTRAVENOUS | Status: DC
  Administered 2022-10-15: 3 mL via INTRAVENOUS

## 2022-10-15 MED ORDER — SODIUM CHLORIDE 0.9 % WEIGHT BASED INFUSION: 1.0000 mL/kg/h | INTRAVENOUS | Status: DC

## 2022-10-15 MED ORDER — MIDAZOLAM HCL 2 MG/2ML IJ SOLN
INTRAMUSCULAR | Status: DC | PRN
  Administered 2022-10-15 (×6): 1 mg via INTRAVENOUS

## 2022-10-15 MED ORDER — SODIUM CHLORIDE 0.9 % IV SOLN: INTRAVENOUS | Status: DC

## 2022-10-15 MED ORDER — CLOPIDOGREL BISULFATE 75 MG PO TABS
75.0000 mg | ORAL_TABLET | Freq: Every day | ORAL | Status: DC
  Administered 2022-10-16: 75 mg via ORAL
  Filled 2022-10-15: qty 1

## 2022-10-15 MED ORDER — CLOPIDOGREL BISULFATE 300 MG PO TABS
ORAL_TABLET | ORAL | Status: DC | PRN
  Administered 2022-10-15: 600 mg via ORAL

## 2022-10-15 MED ORDER — LIDOCAINE HCL (PF) 1 % IJ SOLN
INTRAMUSCULAR | Status: DC | PRN
  Administered 2022-10-15: 2 mL

## 2022-10-15 MED ORDER — IOHEXOL 350 MG/ML SOLN
INTRAVENOUS | Status: DC | PRN
  Administered 2022-10-15: 130 mL

## 2022-10-15 MED ORDER — SODIUM CHLORIDE 0.9 % IV SOLN
INTRAVENOUS | Status: DC | PRN
  Administered 2022-10-15: 250 mL via INTRAVENOUS

## 2022-10-15 MED ORDER — IOHEXOL 350 MG/ML SOLN: INTRAVENOUS | Status: DC | PRN

## 2022-10-15 SURGICAL SUPPLY — 36 items
BALL SAPPHIRE NC24 2.75X12 (BALLOONS) ×1
BALL SAPPHIRE NC24 3.0X12 (BALLOONS) ×1
BALLN SAPPHIRE 2.5X12 (BALLOONS) ×1
BALLN SAPPHIRE 2.5X15 (BALLOONS) ×1
BALLOON SAPPHIRE 2.5X12 (BALLOONS) IMPLANT
BALLOON SAPPHIRE 2.5X15 (BALLOONS) IMPLANT
BALLOON SAPPHIRE NC24 2.75X12 (BALLOONS) IMPLANT
BALLOON SAPPHIRE NC24 3.0X12 (BALLOONS) IMPLANT
BAND CMPR LRG ZPHR (HEMOSTASIS) ×1
BAND ZEPHYR COMPRESS 30 LONG (HEMOSTASIS) IMPLANT
CATH DRAGONFLY OPSTAR (CATHETERS) IMPLANT
CATH LAUNCHER 6FR EBU3.5 (CATHETERS) IMPLANT
CATH OPTITORQUE TIG 4.0 6F (CATHETERS) IMPLANT
CATH SHOCKWAVE C2 3.0X12 (CATHETERS) IMPLANT
CATH TELEPORT (CATHETERS) IMPLANT
CATH TELESCOPE 6F GEC (CATHETERS) IMPLANT
CROWN DIAMONDBACK CLASSIC 1.25 (BURR) IMPLANT
DEVICE RAD COMP TR BAND LRG (VASCULAR PRODUCTS) IMPLANT
ELECT DEFIB PAD ADLT CADENCE (PAD) IMPLANT
GLIDESHEATH SLEND SS 6F .021 (SHEATH) IMPLANT
GUIDEWIRE PRESSURE X 175 (WIRE) IMPLANT
KIT ENCORE 26 ADVANTAGE (KITS) IMPLANT
KIT HEART LEFT (KITS) ×2 IMPLANT
LUBRICANT VIPERSLIDE CORONARY (MISCELLANEOUS) IMPLANT
PACK CARDIAC CATHETERIZATION (CUSTOM PROCEDURE TRAY) ×2 IMPLANT
SHEATH PROBE COVER 6X72 (BAG) IMPLANT
STENT SYNERGY XD 2.50X16 (Permanent Stent) IMPLANT
SYNERGY XD 2.50X16 (Permanent Stent) ×1 IMPLANT
TRANSDUCER W/STOPCOCK (MISCELLANEOUS) ×2 IMPLANT
TUBING CIL FLEX 10 FLL-RA (TUBING) ×2 IMPLANT
WIRE ASAHI PROWATER 180CM (WIRE) IMPLANT
WIRE DOC EXTENSION .014X145CM (WIRE) IMPLANT
WIRE EMERALD 3MM-J .035X260CM (WIRE) IMPLANT
WIRE MAILMAN 300CM (WIRE) IMPLANT
WIRE SION BLUE 300 (WIRE) IMPLANT
WIRE VIPERWIRE COR FLEX .012 (WIRE) IMPLANT

## 2022-10-15 NOTE — Interval H&P Note (Signed)
History and Physical Interval Note:  10/15/2022 10:07 AM  Bryan Hays  has presented today for surgery, with the diagnosis of chest pain.  The various methods of treatment have been discussed with the patient and family. After consideration of risks, benefits and other options for treatment, the patient has consented to  Procedure(s): LEFT HEART CATH AND CORONARY ANGIOGRAPHY (N/A) as a surgical intervention.  The patient's history has been reviewed, patient examined, no change in status, stable for surgery.  I have reviewed the patient's chart and labs.  Questions were answered to the patient's satisfaction.     Early Osmond

## 2022-10-16 ENCOUNTER — Encounter (HOSPITAL_COMMUNITY): Payer: Self-pay | Admitting: Internal Medicine

## 2022-10-16 ENCOUNTER — Other Ambulatory Visit (HOSPITAL_COMMUNITY): Payer: Self-pay

## 2022-10-16 DIAGNOSIS — I2510 Atherosclerotic heart disease of native coronary artery without angina pectoris: Secondary | ICD-10-CM | POA: Diagnosis not present

## 2022-10-16 DIAGNOSIS — I25118 Atherosclerotic heart disease of native coronary artery with other forms of angina pectoris: Secondary | ICD-10-CM

## 2022-10-16 DIAGNOSIS — I10 Essential (primary) hypertension: Secondary | ICD-10-CM | POA: Insufficient documentation

## 2022-10-16 DIAGNOSIS — I2584 Coronary atherosclerosis due to calcified coronary lesion: Secondary | ICD-10-CM | POA: Diagnosis not present

## 2022-10-16 DIAGNOSIS — R0609 Other forms of dyspnea: Secondary | ICD-10-CM | POA: Diagnosis not present

## 2022-10-16 DIAGNOSIS — Z7982 Long term (current) use of aspirin: Secondary | ICD-10-CM | POA: Diagnosis not present

## 2022-10-16 DIAGNOSIS — Z79899 Other long term (current) drug therapy: Secondary | ICD-10-CM | POA: Diagnosis not present

## 2022-10-16 DIAGNOSIS — E785 Hyperlipidemia, unspecified: Secondary | ICD-10-CM | POA: Diagnosis not present

## 2022-10-16 DIAGNOSIS — Z8249 Family history of ischemic heart disease and other diseases of the circulatory system: Secondary | ICD-10-CM | POA: Diagnosis not present

## 2022-10-16 DIAGNOSIS — K219 Gastro-esophageal reflux disease without esophagitis: Secondary | ICD-10-CM | POA: Diagnosis not present

## 2022-10-16 LAB — CBC
HCT: 39.4 % (ref 39.0–52.0)
Hemoglobin: 13.4 g/dL (ref 13.0–17.0)
MCH: 31.1 pg (ref 26.0–34.0)
MCHC: 34 g/dL (ref 30.0–36.0)
MCV: 91.4 fL (ref 80.0–100.0)
Platelets: 190 10*3/uL (ref 150–400)
RBC: 4.31 MIL/uL (ref 4.22–5.81)
RDW: 13.9 % (ref 11.5–15.5)
WBC: 7.5 10*3/uL (ref 4.0–10.5)
nRBC: 0 % (ref 0.0–0.2)

## 2022-10-16 LAB — BASIC METABOLIC PANEL
Anion gap: 6 (ref 5–15)
BUN: 11 mg/dL (ref 8–23)
CO2: 25 mmol/L (ref 22–32)
Calcium: 8.2 mg/dL — ABNORMAL LOW (ref 8.9–10.3)
Chloride: 106 mmol/L (ref 98–111)
Creatinine, Ser: 0.69 mg/dL (ref 0.61–1.24)
GFR, Estimated: >60 mL/min (ref >60)
Glucose, Bld: 103 mg/dL — ABNORMAL HIGH (ref 70–99)
Potassium: 3.8 mmol/L (ref 3.5–5.1)
Sodium: 137 mmol/L (ref 135–145)

## 2022-10-16 MED ORDER — PANTOPRAZOLE SODIUM 40 MG PO TBEC
40.0000 mg | DELAYED_RELEASE_TABLET | Freq: Every day | ORAL | 3 refills | Status: DC
  Filled 2022-10-16: qty 90, 90d supply, fill #0

## 2022-10-16 MED ORDER — CLOPIDOGREL BISULFATE 75 MG PO TABS
75.0000 mg | ORAL_TABLET | Freq: Every day | ORAL | 3 refills | Status: DC
  Filled 2022-10-16: qty 90, 90d supply, fill #0

## 2022-10-16 MED ORDER — NITROGLYCERIN 0.4 MG SL SUBL: 0.4000 mg | SUBLINGUAL_TABLET | SUBLINGUAL | 3 refills | Status: DC | PRN

## 2022-10-16 MED ORDER — NITROGLYCERIN 0.4 MG SL SUBL
0.4000 mg | SUBLINGUAL_TABLET | SUBLINGUAL | 3 refills | Status: DC | PRN
  Filled 2022-10-16: qty 25, 7d supply, fill #0

## 2022-10-16 MED ORDER — CLOPIDOGREL BISULFATE 75 MG PO TABS: 75.0000 mg | ORAL_TABLET | Freq: Every day | ORAL | 3 refills | Status: DC

## 2022-10-16 MED ORDER — PANTOPRAZOLE SODIUM 40 MG PO TBEC: 40.0000 mg | DELAYED_RELEASE_TABLET | Freq: Every day | ORAL | 3 refills | Status: DC

## 2022-10-16 MED FILL — Nitroglycerin IV Soln 100 MCG/ML in D5W: INTRA_ARTERIAL | Qty: 10 | Status: AC

## 2022-10-16 MED FILL — Lidocaine HCl Local Preservative Free (PF) Inj 1%: INTRAMUSCULAR | Qty: 30 | Status: AC

## 2022-10-16 NOTE — Discharge Summary (Signed)
Discharge Summary    Patient ID: Bryan Hays MRN: [id number]; DOB: [birth year]  Admit date: 10/15/2022 Discharge date: 10/16/2022  PCP:  Lajean Manes, MD   [place] Providers Cardiologist:  Kirk Ruths, MD   Discharge Diagnoses    Principal Problem:   CAD (coronary artery disease) Active Problems:   Hyperlipidemia with target LDL less than 70   GASTROESOPHAGEAL REFLUX DISEASE   Hypertension    Diagnostic Studies/Procedures    Left heart cath 10/15/22:   Prox LAD to Mid LAD lesion is 60% stenosed.   1st Diag lesion is 80% stenosed.   RPAV-1 lesion is 80% stenosed.   RPAV-2 lesion is 90% stenosed.   1st Mrg lesion is 60% stenosed.   A stent was successfully placed.   Post intervention, there is a 0% residual stenosis.   1.  High-grade proximal to mid LAD lesion with associated RFR of 0.89.  Given the patient's dyspnea despite medical therapy intervention was pursued with OCT guidance, orbital atherectomy, shockwave lithotripsy, and implantation of 1 drug-eluting stent. 2.  High-grade disease of small first diagonal and distal R PLV branch; these in conjunction subtenon a small myocardial area. 3.  Moderate obtuse marginal disease. 4.  LVEDP of 9 mmHg.   Recommendation: Dual antiplatelet therapy for preferably 6 months with aspirin and Plavix and then Plavix monotherapy indefinitely. _____________   History of Present Illness     Bryan Hays is a 77 y.o. male established care for elevated calcium score and preoperative risk evaluation prior to repair of left thumb trigger finger release. Exercise treadmill November 2023-patient exercised for 3 minutes and 22 seconds achieving a maximum heart rate of 153, no chest pain, normal blood pressure response, no ST changes.  Calcium score January 2024 3834 which was 96 percentile; left main 280, LAD 1264, RCA 2136 and circumflex 154.  Patient states that over the past year he has had progressive dyspnea on  exertion which is new.  There is no orthopnea, PND, pedal edema or syncope.  He also has chest tightness at peak activities relieved with rest.  Cardiology now asked to evaluate.   Hospital Course     Consultants: none  CAD He presented for scheduled OP heart catheterization. LHC showed high grade proximal LAD associated with RFR 0.89. given patient's ongoing symptoms despite medical therapy, intervention was pursued with OCT guided orbital atherectomy, shockwave lithotripsy and DES with 2.5 x 16 mm synergy stent. He was placed on DAPT with ASA and plavix. He was continued on BB, ACEI, and crestor 40 mg.    Hypertension Continue BB (will pick up today) and ACEI.  BP controlled.   Hyperlipidemia with LDL goal < 70 Continue high intensity statin - was just increased to 40 mg crestor 2 days prior. Repeat lipid panel in 6 weeks.   Preoperative evaluation for left thumb trigger release repair He is unable hold DAPT for at least 6 months. Will likely be on plavix monotherapy indefinitely.   Pt seen and examined by Dr. Martinique and deemed stable for discharge. Follow up has been arranged.       Did the patient have an acute coronary syndrome (MI, NSTEMI, STEMI, etc) this admission?:  No                               Did the patient have a percutaneous coronary intervention (stent / angioplasty)?:  Yes.  Cath/PCI Registry Performance & Quality Measures: Aspirin prescribed? - Yes ADP Receptor Inhibitor (Plavix/Clopidogrel, Brilinta/Ticagrelor or Effient/Prasugrel) prescribed (includes medically managed patients)? - Yes High Intensity Statin (Lipitor 40-'80mg'$  or Crestor 20-'40mg'$ ) prescribed? - Yes For EF <40%, was ACEI/ARB prescribed? - Yes For EF <40%, Aldosterone Antagonist (Spironolactone or Eplerenone) prescribed? - Not Applicable (EF >/= [id number]) Cardiac Rehab Phase II ordered? - Yes       The patient will be scheduled for a TOC follow up appointment in 7-14 days.  A message has been  sent to the [facility] and Scheduling Pool at the office where the patient should be seen for follow up.  _____________  Discharge Vitals Blood pressure (!) 146/75, pulse 87, temperature 98.2 F (36.8 C), temperature source Oral, resp. rate 18, height '5\' 6"'$  (1.676 m), weight 102.1 kg, SpO2 96 %.  Filed Weights   10/15/22 1011  Weight: 102.1 kg    Labs & Radiologic Studies    CBC Recent Labs    10/14/22 1445 10/16/22 0355  WBC 8.6 7.5  HGB 14.7 13.4  HCT 43.8 39.4  MCV 91 91.4  PLT 255 [id number]   Basic Metabolic Panel Recent Labs    10/14/22 1445 10/16/22 0355  NA 139 137  K 4.5 3.8  CL 101 106  CO2 29 25  GLUCOSE 110* 103*  BUN 17 11  CREATININE 0.96 0.69  CALCIUM 9.6 8.2*   Liver Function Tests No results for input(s): "AST", "ALT", "ALKPHOS", "BILITOT", "PROT", "ALBUMIN" in the last 72 hours. No results for input(s): "LIPASE", "AMYLASE" in the last 72 hours. High Sensitivity Troponin:   No results for input(s): "TROPONINIHS" in the last 720 hours.  BNP Invalid input(s): "POCBNP" D-Dimer No results for input(s): "DDIMER" in the last 72 hours. Hemoglobin A1C No results for input(s): "HGBA1C" in the last 72 hours. Fasting Lipid Panel No results for input(s): "CHOL", "HDL", "LDLCALC", "TRIG", "CHOLHDL", "LDLDIRECT" in the last 72 hours. Thyroid Function Tests No results for input(s): "TSH", "T4TOTAL", "T3FREE", "THYROIDAB" in the last 72 hours.  Invalid input(s): "FREET3" _____________  CARDIAC CATHETERIZATION  Result Date: 10/15/2022   Prox LAD to Mid LAD lesion is 60% stenosed.   1st Diag lesion is 80% stenosed.   RPAV-1 lesion is 80% stenosed.   RPAV-2 lesion is 90% stenosed.   1st Mrg lesion is 60% stenosed.   A stent was successfully placed.   Post intervention, there is a 0% residual stenosis. 1.  High-grade proximal to mid LAD lesion with associated RFR of 0.89.  Given the patient's dyspnea despite medical therapy intervention was pursued with OCT guidance,  orbital atherectomy, shockwave lithotripsy, and implantation of 1 drug-eluting stent. 2.  High-grade disease of small first diagonal and distal R PLV branch; these in conjunction subtenon a small myocardial area. 3.  Moderate obtuse marginal disease. 4.  LVEDP of 9 mmHg. Recommendation: Dual antiplatelet therapy for preferably 6 months with aspirin and Plavix and then Plavix monotherapy indefinitely.   Disposition   Pt is being discharged home today in good condition.  Follow-up Plans & Appointments     Follow-up Information     Almyra Deforest, [place] Follow up on 10/27/2022.   Specialties: Cardiology, Radiology Why: 10:05 am for hospital follow up Contact information: [address] [place] [address] [phone]                Discharge Instructions     AMB Referral to Cardiac Rehabilitation - Phase II   Complete by: As  directed    Diagnosis: Coronary Stents   After initial evaluation and assessments completed: Virtual Based Care may be provided alone or in conjunction with Phase 2 Cardiac Rehab based on patient barriers.: Yes   Intensive Cardiac Rehabilitation (ICR) [place] location only OR Traditional Cardiac Rehabilitation (TCR) *If criteria for ICR are not met will enroll in TCR [facility] only): Yes   Amb Referral to Cardiac Rehabilitation   Complete by: As directed    Diagnosis:  Coronary Stents PTCA     After initial evaluation and assessments completed: Virtual Based Care may be provided alone or in conjunction with Phase 2 Cardiac Rehab based on patient barriers.: Yes   Intensive Cardiac Rehabilitation (ICR) [place] location only OR Traditional Cardiac Rehabilitation (TCR) *If criteria for ICR are not met will enroll in TCR [facility] only): Yes   Diet - low sodium heart healthy   Complete by: As directed    Discharge instructions   Complete by: As directed    No driving for 2 days. No lifting over 5 lbs for 1 week. No sexual activity for 1 week. Keep procedure site clean &  dry. If you notice increased pain, swelling, bleeding or pus, call/return!  You may shower, but no soaking baths/hot tubs/pools for 1 week.   Increase activity slowly   Complete by: As directed         Discharge Medications   Allergies as of 10/16/2022       Reactions   Banana Nausea And Vomiting   G.I. UPSET   Sulfonamide Derivatives Nausea And Vomiting        Medication List     STOP taking these medications    esomeprazole 20 MG capsule Commonly known as: NEXIUM       TAKE these medications    acetaminophen 650 MG CR tablet Commonly known as: TYLENOL Take 650 mg by mouth every 8 (eight) hours as needed for pain.   aspirin EC 81 MG tablet Take 81 mg by mouth daily.   cholecalciferol 25 MCG (1000 UNIT) tablet Commonly known as: VITAMIN D3 Take 1,000 Units by mouth daily.   clopidogrel 75 MG tablet Commonly known as: PLAVIX Take 1 tablet (75 mg total) by mouth daily with breakfast. Start taking on: October 17, 2022   cyanocobalamin 1000 MCG tablet Commonly known as: VITAMIN B12 Take 1,000 mcg by mouth daily.   lisinopril 5 MG tablet Commonly known as: ZESTRIL Take 5 mg by mouth daily.   metoprolol succinate 25 MG 24 hr tablet Commonly known as: Toprol XL Take 1 tablet (25 mg total) by mouth daily.   Mucinex Fast-Max 10-650-400 MG/20ML Liqd Generic drug: Phenylephrine-APAP-guaiFENesin Take 20 mLs by mouth daily as needed (congestion).   neomycin-polymyxin-dexamethasone 0.1 % ophthalmic suspension Commonly known as: MAXITROL Place 1 drop into both eyes daily as needed (irritation).   nitroGLYCERIN 0.4 MG SL tablet Commonly known as: Nitrostat Place 1 tablet (0.4 mg total) under the tongue every 5 (five) minutes as needed for chest pain.   pantoprazole 40 MG tablet Commonly known as: Protonix Take 1 tablet (40 mg total) by mouth daily.   rosuvastatin 40 MG tablet Commonly known as: Crestor Take 1 tablet (40 mg total) by mouth daily. TAKE 1  TABLET BY MOUTH EVERY DAY           Outstanding Labs/Studies     Duration of Discharge Encounter   Greater than 30 minutes including physician time.  Signed, Ledora Bottcher, PA 10/16/2022, 10:39 AM

## 2022-10-16 NOTE — Progress Notes (Signed)
Discharge instructions reviewed with pt and his wife.  Copy of instructions given to pt. Pt informed scripts filled by [place], volunteer to take pt by the [facility] pharmacy to pick up on his way to main entrance.  Pt d/c'd via wheelchair with belongings, with wife.            Escorted by hospital volunteer.

## 2022-10-16 NOTE — Progress Notes (Signed)
CARDIAC REHAB PHASE I   PRE:  Rate/Rhythm: 81 SR  BP:  Sitting: 135/64      SaO2:   MODE:  Ambulation: 470 ft   POST:  Rate/Rhythm: 97 SR  BP:  Sitting: 146/75      SaO2:   Pt tolerated exercise well and amb 470 ft independently. Pt denies CP, SOB, or dizziness throughout walk. Pt educated on importance of antiplatelet medication, nitro, restrictions, exercise guidelines, heart healthy nutrition and CRP II. Pt will be referred to CRP II. Will continue to follow.  [place], RRT, BSRT 10/16/2022 8:55 AM

## 2022-10-16 NOTE — Discharge Instructions (Signed)

## 2022-10-17 LAB — LIPOPROTEIN A (LPA): Lipoprotein (a): 66.8 nmol/L — ABNORMAL HIGH (ref <75.0)

## 2022-10-20 DIAGNOSIS — R0609 Other forms of dyspnea: Secondary | ICD-10-CM

## 2022-10-21 ENCOUNTER — Telehealth: Payer: Self-pay | Admitting: *Deleted

## 2022-10-21 ENCOUNTER — Encounter: Payer: Self-pay | Admitting: *Deleted

## 2022-10-21 NOTE — Telephone Encounter (Signed)
-----   Message from Ledora Bottcher, [place] sent at 10/16/2022 10:35 AM EST ----- Pt will need a TOC phone call please. Discharging today.  Thanks Angie

## 2022-10-21 NOTE — Telephone Encounter (Signed)
Patient contacted regarding discharge from [facility] on 10/16/22  Patient understands to follow up with provider Eulas Post PA on 10/27/22 at 10:05 am  at [facility]. Patient understands discharge instructions? yes  Patient understands medications and regiment? yes  Patient understands to bring all medications to this visit? yes

## 2022-10-21 NOTE — Telephone Encounter (Signed)
Left message on home voicemail and cell voicemail -  please contact office  to answer TOC question

## 2022-10-26 NOTE — Progress Notes (Unsigned)
Cardiology Office Note:    Date:  10/27/2022   ID:  SOUL COMPANION, DOB 1946/07/11, MRN [id number]  PCP:  Lajean Manes, MD   [place] Providers Cardiologist:  Kirk Ruths, MD     Referring MD: Lajean Manes, MD   Chief Complaint  Patient presents with   follow up post cath    Seen for Dr. Stanford Breed    History of Present Illness:    Bryan Hays is a 77 y.o. male with a hx of coronary artery disease, hypertension, GERD.  Initially referred to our practice in February 2024 at the request of his PCP.  He had a calcium score in January 2024, 3834, placing him in the 96 percentile; left main to 80, LAD 1264, RCA 2136 and circumflex 154.  Patient stated over the last year he had had progressive dyspnea on exertion. He was admitted on 10/15/2022 to 10/16/2022 for outpatient left heart catheterization.  Left heart cath showed high-grade proximal LAD associated with RFR 0.89, intervention was pursued with OCT guided orbital atherectomy, DES placed to his LAD.   He presents today for follow-up after his recent left heart cath.  He states he is feeling good from a cardiac perspective.  He has begun to walk for 10 minutes at a time, 3 times per day. He denies chest pain, palpitations, dyspnea, pnd, orthopnea, n, v, dizziness, syncope, edema, weight gain, or early satiety.  He does endorse neuropathic type pain to bilateral feet, most bothersome when he is sleeping at night.  He denies claudication symptoms.  He states since his cath his wife has been making him adhere to a strict diet, he has lost 5 pounds.  He is eager to complete cardiac rehabilitation, as he knows how important this is for his recovery.  He had an upcoming trigger finger release surgery scheduled however this is not overly bothersome to him right now and he understands the importance of uninterrupted DAPT x 6 months.   Past Medical History:  Diagnosis Date   Allergic rhinitis due to pollen    CAD (coronary artery  disease)    Degeneration of lumbar or lumbosacral intervertebral disc    GERD (gastroesophageal reflux disease)    Hyperlipidemia    Hypertension    Nephrolithiasis    Personal history of urinary calculi    Tendinitis     Past Surgical History:  Procedure Laterality Date   back surgery     CORONARY ATHERECTOMY N/A 10/15/2022   Procedure: CORONARY ATHERECTOMY;  Surgeon: Early Osmond, MD;  Location: [place] [facility];  Service: Cardiovascular;  Laterality: N/A;   CORONARY LITHOTRIPSY N/A 10/15/2022   Procedure: CORONARY LITHOTRIPSY;  Surgeon: Early Osmond, MD;  Location: [place] [facility];  Service: Cardiovascular;  Laterality: N/A;   CORONARY STENT INTERVENTION N/A 10/15/2022   Procedure: CORONARY STENT INTERVENTION;  Surgeon: Early Osmond, MD;  Location: [place] [facility];  Service: Cardiovascular;  Laterality: N/A;   FOOT SURGERY     Hx plantar fasciitis   INTRAVASCULAR IMAGING/OCT N/A 10/15/2022   Procedure: INTRAVASCULAR IMAGING/OCT;  Surgeon: Early Osmond, MD;  Location: [place] [facility];  Service: Cardiovascular;  Laterality: N/A;   INTRAVASCULAR PRESSURE WIRE/FFR STUDY N/A 10/15/2022   Procedure: INTRAVASCULAR PRESSURE WIRE/FFR STUDY;  Surgeon: Early Osmond, MD;  Location: [place] [facility];  Service: Cardiovascular;  Laterality: N/A;   LEFT HEART CATH AND CORONARY ANGIOGRAPHY N/A 10/15/2022   Procedure: LEFT HEART CATH AND CORONARY  ANGIOGRAPHY;  Surgeon: Early Osmond, MD;  Location: [place] [facility];  Service: Cardiovascular;  Laterality: N/A;   SHOULDER ARTHROSCOPY  08/24/2010   right shoulder    TONSILLECTOMY      Current Medications: Current Meds  Medication Sig   acetaminophen (TYLENOL) 650 MG CR tablet Take 650 mg by mouth every 8 (eight) hours as needed for pain.   aspirin 81 MG EC tablet Take 81 mg by mouth daily.     cholecalciferol (VITAMIN D3) 25 MCG (1000 UNIT) tablet Take 1,000 Units by mouth daily.   clopidogrel (PLAVIX) 75 MG tablet  Take 1 tablet (75 mg total) by mouth daily with breakfast.   cyanocobalamin (VITAMIN B12) 1000 MCG tablet Take 1,000 mcg by mouth daily.   lisinopril (PRINIVIL,ZESTRIL) 5 MG tablet Take 5 mg by mouth daily.   metoprolol succinate (TOPROL XL) 25 MG 24 hr tablet Take 1 tablet (25 mg total) by mouth daily.   neomycin-polymyxin-dexamethasone (MAXITROL) 0.1 % ophthalmic suspension Place 1 drop into both eyes daily as needed (irritation).   nitroGLYCERIN (NITROSTAT) 0.4 MG SL tablet Place 1 tablet (0.4 mg total) under the tongue every 5 (five) minutes as needed for chest pain.   pantoprazole (PROTONIX) 40 MG tablet Take 1 tablet (40 mg total) by mouth daily.   Phenylephrine-APAP-guaiFENesin (MUCINEX FAST-MAX) 10-650-400 MG/20ML LIQD Take 20 mLs by mouth daily as needed (congestion).   rosuvastatin (CRESTOR) 40 MG tablet Take 1 tablet (40 mg total) by mouth daily. 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 1 son -'32, daughter -'54; 2 grandsons, 3 step grandchildren. Retired from  [employer]. Enjoying his retirement. 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 End of Life care: provided packet.     Social Determinants of Health   Financial Resource Strain: Not on file  Food Insecurity: No Food Insecurity (10/16/2022)   Hunger Vital Sign    Worried About Running Out of Food in the Last Year: Never true    Ran Out of Food in the Last Year: Never true  Transportation Needs: No Transportation Needs (10/16/2022)   PRAPARE - Hydrologist (Medical): No    Lack of Transportation (Non-Medical): No  Physical Activity: Not on file  Stress: Not on file  Social Connections: Not on file     Family History: The patient's family history includes Alzheimer's disease in his mother; COPD in his father; Coronary artery disease in his father;  Diabetes in his father and mother; Heart attack in his father; Heart disease in his father and mother; Hypertension in his father; Other in his father. There is no history of Cancer.  ROS:   Please see the history of present illness.    All other systems reviewed and are negative.  EKGs/Labs/Other Studies Reviewed:    The following studies were reviewed today:  10/15/2022 left heart cath -    Prox LAD to Mid LAD lesion is 60% stenosed.   1st Diag lesion is 80% stenosed.   RPAV-1 lesion is 80% stenosed.   RPAV-2 lesion is 90% stenosed.   1st Mrg lesion is 60% stenosed.   A stent was successfully placed.   Post intervention, there is a 0% residual stenosis.   1.  High-grade proximal to mid LAD lesion with associated RFR of 0.89.  Given the patient's dyspnea despite medical therapy intervention was pursued with OCT guidance, orbital atherectomy, shockwave lithotripsy, and implantation of 1 drug-eluting stent. 2.  High-grade disease of small first diagonal and distal R PLV branch; these in conjunction subtenon a small myocardial area. 3.  Moderate obtuse marginal disease. 4.  LVEDP of 9 mmHg.   Recommendation: Dual antiplatelet therapy for preferably 6 months with aspirin and Plavix and then Plavix monotherapy  indefinitely.  06/24/2022 exercise tolerance test - No ST deviation was noted. Arrhythmias during stress: rare PVCs. ECG was interpretable and without significant changes. The ECG was negative for ischemia.   EKG:  EKG is  ordered today.  The ekg ordered today demonstrates NSR, HR 69 bpm,   Recent Labs: 10/16/2022: BUN 11; Creatinine, Ser 0.69; Hemoglobin 13.4; Platelets 190; Potassium 3.8; Sodium 137  Recent Lipid Panel    Component Value Date/Time   CHOL 167 03/16/2013 1112   TRIG 85.0 03/16/2013 1112   HDL 47.40 03/16/2013 1112   CHOLHDL 4 03/16/2013 1112   VLDL 17.0 03/16/2013 1112   LDLCALC 103 (H) 03/16/2013 1112     Risk Assessment/Calculations:                Physical Exam:    VS:  BP 120/66 (BP Location: Left Arm, Patient Position: Sitting, Cuff Size: Large)   Pulse 69   Ht 5' 6.5" (1.689 m)   Wt 221 lb 12.8 oz (100.6 kg)   SpO2 94%   BMI 35.26 kg/m     Wt Readings from Last 3 Encounters:  10/27/22 221 lb 12.8 oz (100.6 kg)  10/15/22 225 lb (102.1 kg)  10/14/22 227 lb 6.4 oz (103.1 kg)     GEN:  Well nourished, well developed in no acute distress HEENT: Normal NECK: No JVD; No carotid bruits LYMPHATICS: No lymphadenopathy CARDIAC: RRR, no murmurs, rubs, gallops RESPIRATORY:  Clear to auscultation without rales, wheezing or rhonchi  ABDOMEN: Soft, non-tender, non-distended MUSCULOSKELETAL:  No edema; No deformity  SKIN: Warm and dry, right radial cath site without hematoma or edema, +2 pulses noted distally and proximally to insertion site NEUROLOGIC:  Alert and oriented x 3 PSYCHIATRIC:  Normal affect   ASSESSMENT:    1. Coronary artery disease of native artery of native heart with stable angina pectoris ([place])   2. Hypertension, unspecified type   3. Hyperlipidemia LDL goal <70    PLAN:    In order of problems listed above:  CAD - LHC on 10/15/22 showed high-grade proximal to mid LAD lesion with associated RFR of 0.89.  Given the patient's  dyspnea despite medical therapy intervention was pursued with  OCT guidance, orbital atherectomy, shockwave lithotripsy, and implantation of 1 drug-eluting stent. Continue ASA & Plavix x 6 months, then monotherapy with Plavix indefinitely. Continue metoprolol, NTG PRN.   HLD - LDL 113 on 09/05/21, lipoprotein A 66.8 on 10/16/22. Refer to PharmD for PCSK9i.   Hypertension - blood pressure today 120/66, well controlled.  Continue Zestril, and metoprolol.      Cardiac Rehabilitation Eligibility Assessment  The patient is ready to start cardiac rehabilitation from a cardiac standpoint.          Medication Adjustments/Labs and Tests Ordered: Current medicines are reviewed at length with the patient today.  Concerns regarding medicines are outlined above.  Orders Placed This Encounter  Procedures   AMB Referral to [facility] Pharm-D   EKG 12-Lead   No orders of the defined types were placed in this encounter.   Patient Instructions  Medication Instructions:   Your physician recommends that you continue on your current medications as directed. Please refer to the Current Medication list given to you today.  *If you need a refill on your cardiac medications before your next appointment, please call your pharmacy*  Lab Work: NONE ordered at this time of appointment   If you have labs (blood work) drawn today and your tests are completely normal, you will receive your results only by: [place] (if you have MyChart) OR A paper copy in the mail If you have any lab test that is abnormal or we need to change your treatment, we will call you to review the results.   Testing/Procedures: NONE ordered at this time of appointment   Follow-Up: At [facility], you and your health needs are our priority.  As part of our continuing mission to provide you with exceptional heart care, we have created designated Provider Care Teams.  These Care Teams include your primary  Cardiologist (physician) and Advanced Practice Providers (APPs -  Physician Assistants and Nurse Practitioners) who all work together to provide you with the care you need, when you need it.  Your next appointment:   As previously scheduled   Provider:   Kirk Ruths, MD     Other Instructions [facility]      Signed, Trudi Ida, NP  10/27/2022 5:21 PM    [place]

## 2022-10-27 ENCOUNTER — Telehealth (HOSPITAL_COMMUNITY): Payer: Self-pay

## 2022-10-27 ENCOUNTER — Ambulatory Visit: Payer: Medicare HMO | Attending: Physician Assistant | Admitting: Cardiology

## 2022-10-27 ENCOUNTER — Encounter: Payer: Self-pay | Admitting: Physician Assistant

## 2022-10-27 VITALS — BP 120/66 | HR 69 | Ht 66.5 in | Wt 221.8 lb

## 2022-10-27 DIAGNOSIS — I25118 Atherosclerotic heart disease of native coronary artery with other forms of angina pectoris: Secondary | ICD-10-CM | POA: Diagnosis not present

## 2022-10-27 DIAGNOSIS — E785 Hyperlipidemia, unspecified: Secondary | ICD-10-CM | POA: Diagnosis not present

## 2022-10-27 DIAGNOSIS — I10 Essential (primary) hypertension: Secondary | ICD-10-CM

## 2022-10-27 NOTE — Patient Instructions (Addendum)
Medication Instructions:   Your physician recommends that you continue on your current medications as directed. Please refer to the Current Medication list given to you today.  *If you need a refill on your cardiac medications before your next appointment, please call your pharmacy*  Lab Work: NONE ordered at this time of appointment   If you have labs (blood work) drawn today and your tests are completely normal, you will receive your results only by: [place] (if you have MyChart) OR A paper copy in the mail If you have any lab test that is abnormal or we need to change your treatment, we will call you to review the results.   Testing/Procedures: NONE ordered at this time of appointment   Follow-Up: At [facility], you and your health needs are our priority.  As part of our continuing mission to provide you with exceptional heart care, we have created designated Provider Care Teams.  These Care Teams include your primary Cardiologist (physician) and Advanced Practice Providers (APPs -  Physician Assistants and Nurse Practitioners) who all work together to provide you with the care you need, when you need it.  Your next appointment:   As previously scheduled   Provider:   Kirk Ruths, MD     Other Instructions You have been referred to [facility]

## 2022-10-27 NOTE — Telephone Encounter (Signed)
Pt insurance is active and benefits verified through [facility] Co-pay $35, DED 0/0 met, out of pocket $4,500/$195.15 met, co-insurance 0%. no pre-authorization required. Passport, 10/27/2022'@1'$ :45pm, REF# [id number]  How many CR sessions are covered? (TCR, 72 sessions for ICR)72 Is this a lifetime maximum or an annual maximum? annual Has the member used any of these services to date? no Is there a time limit (weeks/months) on start of program and/or program completion? no  Will contact patient to see if he is interested in the Cardiac Rehab Program. If interested, patient will need to complete follow up appt. Once completed, patient will be contacted for scheduling upon review by the RN Navigator.

## 2022-11-02 DIAGNOSIS — E7800 Pure hypercholesterolemia, unspecified: Secondary | ICD-10-CM | POA: Diagnosis not present

## 2022-11-05 DIAGNOSIS — I2510 Atherosclerotic heart disease of native coronary artery without angina pectoris: Secondary | ICD-10-CM | POA: Diagnosis not present

## 2022-11-05 DIAGNOSIS — K219 Gastro-esophageal reflux disease without esophagitis: Secondary | ICD-10-CM | POA: Diagnosis not present

## 2022-11-05 DIAGNOSIS — Z1331 Encounter for screening for depression: Secondary | ICD-10-CM | POA: Diagnosis not present

## 2022-11-05 DIAGNOSIS — Z0000 Encounter for general adult medical examination without abnormal findings: Secondary | ICD-10-CM | POA: Diagnosis not present

## 2022-11-05 DIAGNOSIS — Z79899 Other long term (current) drug therapy: Secondary | ICD-10-CM | POA: Diagnosis not present

## 2022-11-05 DIAGNOSIS — I25119 Atherosclerotic heart disease of native coronary artery with unspecified angina pectoris: Secondary | ICD-10-CM | POA: Diagnosis not present

## 2022-11-05 DIAGNOSIS — E119 Type 2 diabetes mellitus without complications: Secondary | ICD-10-CM | POA: Diagnosis not present

## 2022-11-05 DIAGNOSIS — R7303 Prediabetes: Secondary | ICD-10-CM | POA: Diagnosis not present

## 2022-11-05 DIAGNOSIS — G629 Polyneuropathy, unspecified: Secondary | ICD-10-CM | POA: Diagnosis not present

## 2022-11-05 DIAGNOSIS — Z23 Encounter for immunization: Secondary | ICD-10-CM | POA: Diagnosis not present

## 2022-11-05 DIAGNOSIS — K9089 Other intestinal malabsorption: Secondary | ICD-10-CM | POA: Diagnosis not present

## 2022-11-05 DIAGNOSIS — I77810 Thoracic aortic ectasia: Secondary | ICD-10-CM | POA: Diagnosis not present

## 2022-11-05 DIAGNOSIS — Z6835 Body mass index (BMI) 35.0-35.9, adult: Secondary | ICD-10-CM | POA: Diagnosis not present

## 2022-11-09 ENCOUNTER — Telehealth (HOSPITAL_COMMUNITY): Payer: Self-pay

## 2022-11-09 NOTE — Telephone Encounter (Signed)
Called patient to see if he was interested in participating in the Cardiac Rehab Program. Patient stated yes. Patient will come in for orientation on 11/11/22 @ 10AM and will attend the 11:45AM exercise class. Went over insurance, patient verbalized understanding.

## 2022-11-11 ENCOUNTER — Encounter: Payer: Self-pay | Admitting: Internal Medicine

## 2022-11-11 ENCOUNTER — Encounter (HOSPITAL_COMMUNITY)
Admission: RE | Admit: 2022-11-11 | Discharge: 2022-11-11 | Disposition: A | Discharge status: Home / Self Care | Payer: Medicare HMO | Source: Ambulatory Visit | Attending: Cardiology | Admitting: Cardiology

## 2022-11-11 VITALS — BP 106/62 | HR 62 | Ht 66.5 in | Wt 225.1 lb

## 2022-11-11 DIAGNOSIS — Z48812 Encounter for surgical aftercare following surgery on the circulatory system: Secondary | ICD-10-CM | POA: Insufficient documentation

## 2022-11-11 DIAGNOSIS — Z955 Presence of coronary angioplasty implant and graft: Secondary | ICD-10-CM | POA: Diagnosis not present

## 2022-11-11 NOTE — Progress Notes (Signed)
Cardiac Rehab Medication Review   Does the patient  feel that his/her medications are working for him/her?  YES   Has the patient been experiencing any side effects to the medications prescribed?  YES  Does the patient measure his/her own blood pressure or blood glucose at home?  YES    Does the patient have any problems obtaining medications due to transportation or finances? NO  Understanding of regimen: excellent Understanding of indications: excellent Potential of compliance: excellent    Comments: Bryan Hays has a good understanding of his medications and regime. He checks his BP at home almost everyday. He recently started Gabapentin and states he has had trouble falling asleep since starting it.     Bryan Ewing, MS 11/11/2022 1:55 PM

## 2022-11-11 NOTE — Progress Notes (Signed)
Cardiac Individual Treatment Plan  Patient Details  Name: Bryan Hays MRN: [id number] Date of Birth: Oct 23, 1945 Referring Provider:   Flowsheet Row INTENSIVE CARDIAC REHAB ORIENT from 11/11/2022 in [facility] [place]  Referring Provider Kirk Ruths, MD       Initial Encounter Date:  [place] from 11/11/2022 in [facility]  Date 11/11/22       Visit Diagnosis: S/P drug eluting coronary stent placement  Patient's Home Medications on Admission:  Current Outpatient Medications:    gabapentin (NEURONTIN) 300 MG capsule, Take 300 mg by mouth at bedtime., Disp: , Rfl:    acetaminophen (TYLENOL) 650 MG CR tablet, Take 650 mg by mouth every 8 (eight) hours as needed for pain., Disp: , Rfl:    aspirin 81 MG EC tablet, Take 81 mg by mouth daily.  , Disp: , Rfl:    cholecalciferol (VITAMIN D3) 25 MCG (1000 UNIT) tablet, Take 1,000 Units by mouth daily., Disp: , Rfl:    clopidogrel (PLAVIX) 75 MG tablet, Take 1 tablet (75 mg total) by mouth daily with breakfast., Disp: 90 tablet, Rfl: 3   cyanocobalamin (VITAMIN B12) 1000 MCG tablet, Take 1,000 mcg by mouth daily., Disp: , Rfl:    lisinopril (PRINIVIL,ZESTRIL) 5 MG tablet, Take 5 mg by mouth daily., Disp: , Rfl: 0   metoprolol succinate (TOPROL XL) 25 MG 24 hr tablet, Take 1 tablet (25 mg total) by mouth daily., Disp: 90 tablet, Rfl: 3   neomycin-polymyxin-dexamethasone (MAXITROL) 0.1 % ophthalmic suspension, Place 1 drop into both eyes daily as needed (irritation)., Disp: , Rfl:    nitroGLYCERIN (NITROSTAT) 0.4 MG SL tablet, Place 1 tablet (0.4 mg total) under the tongue every 5 (five) minutes as needed for chest pain., Disp: 25 tablet, Rfl: 3   pantoprazole (PROTONIX) 40 MG tablet, Take 1 tablet (40 mg total) by mouth daily., Disp: 90 tablet, Rfl: 3   Phenylephrine-APAP-guaiFENesin (MUCINEX  FAST-MAX) 10-650-400 MG/20ML LIQD, Take 20 mLs by mouth daily as needed (congestion)., Disp: , Rfl:    rosuvastatin (CRESTOR) 40 MG tablet, Take 1 tablet (40 mg total) by mouth daily. TAKE 1 TABLET BY MOUTH EVERY DAY, Disp: 90 tablet, Rfl: 3  Past Medical History: Past Medical History:  Diagnosis Date   Allergic rhinitis due to pollen    CAD (coronary artery disease)    Degeneration of lumbar or lumbosacral intervertebral disc    GERD (gastroesophageal reflux disease)    Hyperlipidemia    Hypertension    Nephrolithiasis    Personal history of urinary calculi    Tendinitis     Tobacco Use: Social History   Tobacco Use  Smoking Status Never  Smokeless Tobacco Never    Labs: Review Flowsheet  More data exists      Latest Ref Rng & Units 02/11/2009 02/11/2010 02/13/2011 03/09/2012 03/16/2013  Labs for ITP Cardiac and Pulmonary Rehab  Cholestrol 0 - 200 mg/dL 181  157  182  170  167   LDL (calc) 0 - 99 mg/dL 120  91  116  101  103   HDL-C >39.00 mg/dL 47.70  44.40  46.50  52.70  47.40   Trlycerides 0.0 - 149.0 mg/dL 67.0  110.0  100.0  84.0  85.0     Capillary Blood Glucose: No results found for: "GLUCAP"   Exercise Target Goals: Exercise Program Goal: Individual exercise prescription set using  results from initial 6 min walk test and THRR while considering  patient's activity barriers and safety.   Exercise Prescription Goal: Initial exercise prescription builds to 30-45 minutes a day of aerobic activity, 2-3 days per week.  Home exercise guidelines will be given to patient during program as part of exercise prescription that the participant will acknowledge.  Activity Barriers & Risk Stratification:  Activity Barriers & Cardiac Risk Stratification - 11/11/22 1344       Activity Barriers & Cardiac Risk Stratification   Activity Barriers Shortness of Breath;Joint Problems;Back Problems    Cardiac Risk Stratification High   < 5 METs on 6MWT            6 Minute  Walk:  6 Minute Walk     Row Name 11/11/22 1343         6 Minute Walk   Phase Initial     Distance 1724 feet     Walk Time 6 minutes     # of Rest Breaks 0     MPH 3.27     METS 2.89     RPE 12     Perceived Dyspnea  1     VO2 Peak 10.14     Symptoms Yes (comment)     Comments mild SOB, resolved with rest. no pain     Resting HR 62 bpm     Resting BP 106/62     Resting Oxygen Saturation  96 %     Exercise Oxygen Saturation  during 6 min walk 98 %     Max Ex. HR 108 bpm     Max Ex. BP 130/62     2 Minute Post BP 104/66              Oxygen Initial Assessment:   Oxygen Re-Evaluation:   Oxygen Discharge (Final Oxygen Re-Evaluation):   Initial Exercise Prescription:  Initial Exercise Prescription - 11/11/22 1300       Date of Initial Exercise RX and Referring Provider   Date 11/11/22    Referring Provider Kirk Ruths, MD    Expected Discharge Date 01/22/23      Bike   Level 1.5    Watts 20    Minutes 15    METs 2.5      Recumbant Elliptical   Level 2    RPM 50    Watts 80    Minutes 15    METs 2.5      Prescription Details   Frequency (times per week) 3    Duration Progress to 30 minutes of continuous aerobic without signs/symptoms of physical distress      Intensity   THRR 40-80% of Max Heartrate 57-115    Ratings of Perceived Exertion 11-13    Perceived Dyspnea 0-4      Progression   Progression Continue progressive overload as per policy without signs/symptoms or physical distress.      Resistance Training   Training Prescription Yes    Weight 3    Reps 10-15             Perform Capillary Blood Glucose checks as needed.  Exercise Prescription Changes:   Exercise Comments:   Exercise Goals and Review:   Exercise Goals     Row Name 11/11/22 1350             Exercise Goals   Increase Physical Activity Yes       Intervention Provide advice, education, support and counseling about  physical activity/exercise  needs.;Develop an individualized exercise prescription for aerobic and resistive training based on initial evaluation findings, risk stratification, comorbidities and participant's personal goals.       Expected Outcomes Short Term: Attend rehab on a regular basis to increase amount of physical activity.;Long Term: Exercising regularly at least 3-5 days a week.;Long Term: Add in home exercise to make exercise part of routine and to increase amount of physical activity.       Increase Strength and Stamina Yes       Intervention Provide advice, education, support and counseling about physical activity/exercise needs.;Develop an individualized exercise prescription for aerobic and resistive training based on initial evaluation findings, risk stratification, comorbidities and participant's personal goals.       Expected Outcomes Short Term: Perform resistance training exercises routinely during rehab and add in resistance training at home;Short Term: Increase workloads from initial exercise prescription for resistance, speed, and METs.;Long Term: Improve cardiorespiratory fitness, muscular endurance and strength as measured by increased METs and functional capacity (6MWT)       Able to understand and use rate of perceived exertion (RPE) scale Yes       Intervention Provide education and explanation on how to use RPE scale       Expected Outcomes Short Term: Able to use RPE daily in rehab to express subjective intensity level;Long Term:  Able to use RPE to guide intensity level when exercising independently       Able to understand and use Dyspnea scale Yes       Intervention Provide education and explanation on how to use Dyspnea scale       Expected Outcomes Short Term: Able to use Dyspnea scale daily in rehab to express subjective sense of shortness of breath during exertion;Long Term: Able to use Dyspnea scale to guide intensity level when exercising independently       Knowledge and understanding of  Target Heart Rate Range (THRR) Yes       Intervention Provide education and explanation of THRR including how the numbers were predicted and where they are located for reference       Expected Outcomes Long Term: Able to use THRR to govern intensity when exercising independently;Short Term: Able to state/look up THRR;Short Term: Able to use daily as guideline for intensity in rehab       Understanding of Exercise Prescription Yes       Intervention Provide education, explanation, and written materials on patient's individual exercise prescription       Expected Outcomes Short Term: Able to explain program exercise prescription;Long Term: Able to explain home exercise prescription to exercise independently                Exercise Goals Re-Evaluation :   Discharge Exercise Prescription (Final Exercise Prescription Changes):   Nutrition:  Target Goals: Understanding of nutrition guidelines, daily intake of sodium 1500mg , cholesterol 200mg , calories 30% from fat and 7% or less from saturated fats, daily to have 5 or more servings of fruits and vegetables.  Biometrics:  Pre Biometrics - 11/11/22 1340       Pre Biometrics   Waist Circumference 49 inches    Hip Circumference 47.5 inches    Waist to Hip Ratio 1.03 %    Triceps Skinfold 35 mm    % Body Fat 38.9 %    Grip Strength 28 kg    Flexibility 0 in   could not reach   Single Leg Stand 5.12 seconds  Nutrition Therapy Plan and Nutrition Goals:   Nutrition Assessments:  MEDIFICTS Score Key: ?70 Need to make dietary changes  40-70 Heart Healthy Diet ? 40 Therapeutic Level Cholesterol Diet    Picture Your Plate Scores: [id number] Unhealthy dietary pattern with much room for improvement. 41-50 Dietary pattern unlikely to meet recommendations for good health and room for improvement. 51-60 More healthful dietary pattern, with some room for improvement.  >60 Healthy dietary pattern, although there may be some  specific behaviors that could be improved.    Nutrition Goals Re-Evaluation:   Nutrition Goals Re-Evaluation:   Nutrition Goals Discharge (Final Nutrition Goals Re-Evaluation):   Psychosocial: Target Goals: Acknowledge presence or absence of significant depression and/or stress, maximize coping skills, provide positive support system. Participant is able to verbalize types and ability to use techniques and skills needed for reducing stress and depression.  Initial Review & Psychosocial Screening:  Initial Psych Review & Screening - 11/11/22 1351       Initial Review   Current issues with None Identified      Family Dynamics   Good Support System? Yes   Liliane Channel has his wife for support     Barriers   Psychosocial barriers to participate in program There are no identifiable barriers or psychosocial needs.      Screening Interventions   Interventions Encouraged to exercise;Provide feedback about the scores to participant    Expected Outcomes Long Term goal: The participant improves quality of Life and PHQ9 Scores as seen by post scores and/or verbalization of changes;Short Term goal: Identification and review with participant of any Quality of Life or Depression concerns found by scoring the questionnaire.             Quality of Life Scores:  Quality of Life - 11/11/22 1352       Quality of Life   Select Quality of Life      Quality of Life Scores   Health/Function Pre 28.63 %    Socioeconomic Pre 27.58 %    Psych/Spiritual Pre 30 %    Family Pre 30 %    GLOBAL Pre 28.94 %            Scores of 19 and below usually indicate a poorer quality of life in these areas.  A difference of  2-3 points is a clinically meaningful difference.  A difference of 2-3 points in the total score of the Quality of Life Index has been associated with significant improvement in overall quality of life, self-image, physical symptoms, and general health in studies assessing change in  quality of life.  PHQ-9: Review Flowsheet       11/11/2022 12/13/2017 09/21/2015  Depression screen PHQ 2/9  Decreased Interest 0 0 0  Down, Depressed, Hopeless 0 0 0  PHQ - 2 Score 0 0 0  Altered sleeping 0 - -  Tired, decreased energy 1 - -  Change in appetite 0 - -  Feeling bad or failure about yourself  0 - -  Trouble concentrating 0 - -  Moving slowly or fidgety/restless 0 - -  Suicidal thoughts 0 - -  PHQ-9 Score 1 - -  Difficult doing work/chores Somewhat difficult - -   Interpretation of Total Score  Total Score Depression Severity:  1-4 = Minimal depression, 5-9 = Mild depression, 10-14 = Moderate depression, 15-19 = Moderately severe depression, 20-27 = Severe depression   Psychosocial Evaluation and Intervention:   Psychosocial Re-Evaluation:   Psychosocial Discharge (Final Psychosocial Re-Evaluation):  Vocational Rehabilitation: Provide vocational rehab assistance to qualifying candidates.   Vocational Rehab Evaluation & Intervention:  Vocational Rehab - 11/11/22 1354       Initial Vocational Rehab Evaluation & Intervention   Assessment shows need for Vocational Rehabilitation No   Liliane Channel is retired            Education: Education Goals: Education classes will be provided on a weekly basis, covering required topics. Participant will state understanding/return demonstration of topics presented.     Core Videos: Exercise    Move It!  Clinical staff conducted group or individual video education with verbal and written material and guidebook.  Patient learns the recommended Pritikin exercise program. Exercise with the goal of living a long, healthy life. Some of the health benefits of exercise include controlled diabetes, healthier blood pressure levels, improved cholesterol levels, improved heart and lung capacity, improved sleep, and better body composition. Everyone should speak with their doctor before starting or changing an exercise  routine.  Biomechanical Limitations Clinical staff conducted group or individual video education with verbal and written material and guidebook.  Patient learns how biomechanical limitations can impact exercise and how we can mitigate and possibly overcome limitations to have an impactful and balanced exercise routine.  Body Composition Clinical staff conducted group or individual video education with verbal and written material and guidebook.  Patient learns that body composition (ratio of muscle mass to fat mass) is a key component to assessing overall fitness, rather than body weight alone. Increased fat mass, especially visceral belly fat, can put [place] at increased risk for metabolic syndrome, type 2 diabetes, heart disease, and even death. It is recommended to combine diet and exercise (cardiovascular and resistance training) to improve your body composition. Seek guidance from your physician and exercise physiologist before implementing an exercise routine.  Exercise Action Plan Clinical staff conducted group or individual video education with verbal and written material and guidebook.  Patient learns the recommended strategies to achieve and enjoy long-term exercise adherence, including variety, self-motivation, self-efficacy, and positive decision making. Benefits of exercise include fitness, good health, weight management, more energy, better sleep, less stress, and overall well-being.  Medical   Heart Disease Risk Reduction Clinical staff conducted group or individual video education with verbal and written material and guidebook.  Patient learns our heart is our most vital organ as it circulates oxygen, nutrients, white blood cells, and hormones throughout the entire body, and carries waste away. Data supports a plant-based eating plan like the Pritikin Program for its effectiveness in slowing progression of and reversing heart disease. The video provides a number of recommendations to  address heart disease.   Metabolic Syndrome and Belly Fat  Clinical staff conducted group or individual video education with verbal and written material and guidebook.  Patient learns what metabolic syndrome is, how it leads to heart disease, and how one can reverse it and keep it from coming back. You have metabolic syndrome if you have 3 of the following 5 criteria: abdominal obesity, high blood pressure, high triglycerides, low HDL cholesterol, and high blood sugar.  Hypertension and Heart Disease Clinical staff conducted group or individual video education with verbal and written material and guidebook.  Patient learns that high blood pressure, or hypertension, is very common in the [place]. Hypertension is largely due to excessive salt intake, but other important risk factors include being overweight, physical inactivity, drinking too much alcohol, smoking, and not eating enough potassium from fruits and vegetables. High blood pressure  is a leading risk factor for heart attack, stroke, congestive heart failure, dementia, kidney failure, and premature death. Long-term effects of excessive salt intake include stiffening of the arteries and thickening of heart muscle and organ damage. Recommendations include ways to reduce hypertension and the risk of heart disease.  Diseases of Our Time - Focusing on Diabetes Clinical staff conducted group or individual video education with verbal and written material and guidebook.  Patient learns why the best way to stop diseases of our time is prevention, through food and other lifestyle changes. Medicine (such as prescription pills and surgeries) is often only a Band-Aid on the problem, not a long-term solution. Most common diseases of our time include obesity, type 2 diabetes, hypertension, heart disease, and cancer. The Pritikin Program is recommended and has been proven to help reduce, reverse, and/or prevent the damaging effects of metabolic  syndrome.  Nutrition   Overview of the Pritikin Eating Plan  Clinical staff conducted group or individual video education with verbal and written material and guidebook.  Patient learns about the Bee for disease risk reduction. The Roberts emphasizes a wide variety of unrefined, minimally-processed carbohydrates, like fruits, vegetables, whole grains, and legumes. Go, Caution, and Stop food choices are explained. Plant-based and lean animal proteins are emphasized. Rationale provided for low sodium intake for blood pressure control, low added sugars for blood sugar stabilization, and low added fats and oils for coronary artery disease risk reduction and weight management.  Calorie Density  Clinical staff conducted group or individual video education with verbal and written material and guidebook.  Patient learns about calorie density and how it impacts the Pritikin Eating Plan. Knowing the characteristics of the food you choose will help you decide whether those foods will lead to weight gain or weight loss, and whether you want to consume more or less of them. Weight loss is usually a side effect of the Pritikin Eating Plan because of its focus on low calorie-dense foods.  Label Reading  Clinical staff conducted group or individual video education with verbal and written material and guidebook.  Patient learns about the Pritikin recommended label reading guidelines and corresponding recommendations regarding calorie density, added sugars, sodium content, and whole grains.  Dining Out - Part 1  Clinical staff conducted group or individual video education with verbal and written material and guidebook.  Patient learns that restaurant meals can be sabotaging because they can be so high in calories, fat, sodium, and/or sugar. Patient learns recommended strategies on how to positively address this and avoid unhealthy pitfalls.  Facts on Fats  Clinical staff conducted  group or individual video education with verbal and written material and guidebook.  Patient learns that lifestyle modifications can be just as effective, if not more so, as many medications for lowering your risk of heart disease. A Pritikin lifestyle can help to reduce your risk of inflammation and atherosclerosis (cholesterol build-up, or plaque, in the artery walls). Lifestyle interventions such as dietary choices and physical activity address the cause of atherosclerosis. A review of the types of fats and their impact on blood cholesterol levels, along with dietary recommendations to reduce fat intake is also included.  Nutrition Action Plan  Clinical staff conducted group or individual video education with verbal and written material and guidebook.  Patient learns how to incorporate Pritikin recommendations into their lifestyle. Recommendations include planning and keeping personal health goals in mind as an important part of their success.  Healthy Mind-Set  Healthy Minds, Bodies, Hearts  Clinical staff conducted group or individual video education with verbal and written material and guidebook.  Patient learns how to identify when they are stressed. Video will discuss the impact of that stress, as well as the many benefits of stress management. Patient will also be introduced to stress management techniques. The way we think, act, and feel has an impact on our hearts.  How Our Thoughts Can Heal Our Hearts  Clinical staff conducted group or individual video education with verbal and written material and guidebook.  Patient learns that negative thoughts can cause depression and anxiety. This can result in negative lifestyle behavior and serious health problems. Cognitive behavioral therapy is an effective method to help control our thoughts in order to change and improve our emotional outlook.  Additional Videos:  Exercise    Improving Performance  Clinical staff conducted group or  individual video education with verbal and written material and guidebook.  Patient learns to use a non-linear approach by alternating intensity levels and lengths of time spent exercising to help burn more calories and lose more body fat. Cardiovascular exercise helps improve heart health, metabolism, hormonal balance, blood sugar control, and recovery from fatigue. Resistance training improves strength, endurance, balance, coordination, reaction time, metabolism, and muscle mass. Flexibility exercise improves circulation, posture, and balance. Seek guidance from your physician and exercise physiologist before implementing an exercise routine and learn your capabilities and proper form for all exercise.  Introduction to Yoga  Clinical staff conducted group or individual video education with verbal and written material and guidebook.  Patient learns about yoga, a discipline of the coming together of mind, breath, and body. The benefits of yoga include improved flexibility, improved range of motion, better posture and core strength, increased lung function, weight loss, and positive self-image. Yoga's heart health benefits include lowered blood pressure, healthier heart rate, decreased cholesterol and triglyceride levels, improved immune function, and reduced stress. Seek guidance from your physician and exercise physiologist before implementing an exercise routine and learn your capabilities and proper form for all exercise.  Medical   Aging: Enhancing Your Quality of Life  Clinical staff conducted group or individual video education with verbal and written material and guidebook.  Patient learns key strategies and recommendations to stay in good physical health and enhance quality of life, such as prevention strategies, having an advocate, securing a Scraper, and keeping a list of medications and system for tracking them. It also discusses how to avoid risk for bone  loss.  Biology of Weight Control  Clinical staff conducted group or individual video education with verbal and written material and guidebook.  Patient learns that weight gain occurs because we consume more calories than we burn (eating more, moving less). Even if your body weight is normal, you may have higher ratios of fat compared to muscle mass. Too much body fat puts you at increased risk for cardiovascular disease, heart attack, stroke, type 2 diabetes, and obesity-related cancers. In addition to exercise, following the [place] can help reduce your risk.  Decoding Lab Results  Clinical staff conducted group or individual video education with verbal and written material and guidebook.  Patient learns that lab test reflects one measurement whose values change over time and are influenced by many factors, including medication, stress, sleep, exercise, food, hydration, pre-existing medical conditions, and more. It is recommended to use the knowledge from this video to become more involved with your lab results  and evaluate your numbers to speak with your doctor.   Diseases of Our Time - Overview  Clinical staff conducted group or individual video education with verbal and written material and guidebook.  Patient learns that according to the CDC, 50% to 70% of chronic diseases (such as obesity, type 2 diabetes, elevated lipids, hypertension, and heart disease) are avoidable through lifestyle improvements including healthier food choices, listening to satiety cues, and increased physical activity.  Sleep Disorders Clinical staff conducted group or individual video education with verbal and written material and guidebook.  Patient learns how good quality and duration of sleep are important to overall health and well-being. Patient also learns about sleep disorders and how they impact health along with recommendations to address them, including discussing with a physician.  Nutrition   Dining Out - Part 2 Clinical staff conducted group or individual video education with verbal and written material and guidebook.  Patient learns how to plan ahead and communicate in order to maximize their dining experience in a healthy and nutritious manner. Included are recommended food choices based on the type of restaurant the patient is visiting.   Fueling a Best boy conducted group or individual video education with verbal and written material and guidebook.  There is a strong connection between our food choices and our health. Diseases like obesity and type 2 diabetes are very prevalent and are in large-part due to lifestyle choices. The Pritikin Eating Plan provides plenty of food and hunger-curbing satisfaction. It is easy to follow, affordable, and helps reduce health risks.  Menu Workshop  Clinical staff conducted group or individual video education with verbal and written material and guidebook.  Patient learns that restaurant meals can sabotage health goals because they are often packed with calories, fat, sodium, and sugar. Recommendations include strategies to plan ahead and to communicate with the manager, chef, or server to help order a healthier meal.  Planning Your Eating Strategy  Clinical staff conducted group or individual video education with verbal and written material and guidebook.  Patient learns about the [place] and its benefit of reducing the risk of disease. The [place] does not focus on calories. Instead, it emphasizes high-quality, nutrient-rich foods. By knowing the characteristics of the foods, we choose, we can determine their calorie density and make informed decisions.  Targeting Your Nutrition Priorities  Clinical staff conducted group or individual video education with verbal and written material and guidebook.  Patient learns that lifestyle habits have a tremendous impact on disease risk and progression. This  video provides eating and physical activity recommendations based on your personal health goals, such as reducing LDL cholesterol, losing weight, preventing or controlling type 2 diabetes, and reducing high blood pressure.  Vitamins and Minerals  Clinical staff conducted group or individual video education with verbal and written material and guidebook.  Patient learns different ways to obtain key vitamins and minerals, including through a recommended healthy diet. It is important to discuss all supplements you take with your doctor.   Healthy Mind-Set    Smoking Cessation  Clinical staff conducted group or individual video education with verbal and written material and guidebook.  Patient learns that cigarette smoking and tobacco addiction pose a serious health risk which affects millions of people. Stopping smoking will significantly reduce the risk of heart disease, lung disease, and many forms of cancer. Recommended strategies for quitting are covered, including working with your doctor to develop a successful plan.  Culinary  Becoming a Financial trader conducted group or individual video education with verbal and written material and guidebook.  Patient learns that cooking at home can be healthy, cost-effective, quick, and puts them in control. Keys to cooking healthy recipes will include looking at your recipe, assessing your equipment needs, planning ahead, making it simple, choosing cost-effective seasonal ingredients, and limiting the use of added fats, salts, and sugars.  Cooking - Breakfast and Snacks  Clinical staff conducted group or individual video education with verbal and written material and guidebook.  Patient learns how important breakfast is to satiety and nutrition through the entire day. Recommendations include key foods to eat during breakfast to help stabilize blood sugar levels and to prevent overeating at meals later in the day. Planning ahead is also a  key component.  Cooking - Human resources officer conducted group or individual video education with verbal and written material and guidebook.  Patient learns eating strategies to improve overall health, including an approach to cook more at home. Recommendations include thinking of animal protein as a side on your plate rather than center stage and focusing instead on lower calorie dense options like vegetables, fruits, whole grains, and plant-based proteins, such as beans. Making sauces in large quantities to freeze for later and leaving the skin on your vegetables are also recommended to maximize your experience.  Cooking - Healthy Salads and Dressing Clinical staff conducted group or individual video education with verbal and written material and guidebook.  Patient learns that vegetables, fruits, whole grains, and legumes are the foundations of the [place]. Recommendations include how to incorporate each of these in flavorful and healthy salads, and how to create homemade salad dressings. Proper handling of ingredients is also covered. Cooking - Soups and [employer] - Soups and Desserts Clinical staff conducted group or individual video education with verbal and written material and guidebook.  Patient learns that Pritikin soups and desserts make for easy, nutritious, and delicious snacks and meal components that are low in sodium, fat, sugar, and calorie density, while high in vitamins, minerals, and filling fiber. Recommendations include simple and healthy ideas for soups and desserts.   Overview     The Pritikin Solution Program Overview Clinical staff conducted group or individual video education with verbal and written material and guidebook.  Patient learns that the results of the [place] Program have been documented in more than 100 articles published in peer-reviewed journals, and the benefits include reducing risk factors for (and, in some cases, even  reversing) high cholesterol, high blood pressure, type 2 diabetes, obesity, and more! An overview of the three key pillars of the Pritikin Program will be covered: eating well, doing regular exercise, and having a healthy mind-set.  WORKSHOPS  Exercise: Exercise Basics: Building Your Action Plan Clinical staff led group instruction and group discussion with PowerPoint presentation and patient guidebook. To enhance the learning environment the use of posters, models and videos may be added. At the conclusion of this workshop, patients will comprehend the difference between physical activity and exercise, as well as the benefits of incorporating both, into their routine. Patients will understand the FITT (Frequency, Intensity, Time, and Type) principle and how to use it to build an exercise action plan. In addition, safety concerns and other considerations for exercise and cardiac rehab will be addressed by the presenter. The purpose of this lesson is to promote a comprehensive and effective weekly exercise routine in order to improve  patients' overall level of fitness.   Managing Heart Disease: Your Path to a Healthier Heart Clinical staff led group instruction and group discussion with PowerPoint presentation and patient guidebook. To enhance the learning environment the use of posters, models and videos may be added.At the conclusion of this workshop, patients will understand the anatomy and physiology of the heart. Additionally, they will understand how Pritikin's three pillars impact the risk factors, the progression, and the management of heart disease.  The purpose of this lesson is to provide a high-level overview of the heart, heart disease, and how the Pritikin lifestyle positively impacts risk factors.  Exercise Biomechanics Clinical staff led group instruction and group discussion with PowerPoint presentation and patient guidebook. To enhance the learning environment the use of  posters, models and videos may be added. Patients will learn how the structural parts of their bodies function and how these functions impact their daily activities, movement, and exercise. Patients will learn how to promote a neutral spine, learn how to manage pain, and identify ways to improve their physical movement in order to promote healthy living. The purpose of this lesson is to expose patients to common physical limitations that impact physical activity. Participants will learn practical ways to adapt and manage aches and pains, and to minimize their effect on regular exercise. Patients will learn how to maintain good posture while sitting, walking, and lifting.  Balance Training and Fall Prevention  Clinical staff led group instruction and group discussion with PowerPoint presentation and patient guidebook. To enhance the learning environment the use of posters, models and videos may be added. At the conclusion of this workshop, patients will understand the importance of their sensorimotor skills (vision, proprioception, and the vestibular system) in maintaining their ability to balance as they age. Patients will apply a variety of balancing exercises that are appropriate for their current level of function. Patients will understand the common causes for poor balance, possible solutions to these problems, and ways to modify their physical environment in order to minimize their fall risk. The purpose of this lesson is to teach patients about the importance of maintaining balance as they age and ways to minimize their risk of falling.  WORKSHOPS   Nutrition:  Fueling a Scientist, research (physical sciences) led group instruction and group discussion with PowerPoint presentation and patient guidebook. To enhance the learning environment the use of posters, models and videos may be added. Patients will review the foundational principles of the [place] and understand what constitutes a  serving size in each of the food groups. Patients will also learn Pritikin-friendly foods that are better choices when away from home and review make-ahead meal and snack options. Calorie density will be reviewed and applied to three nutrition priorities: weight maintenance, weight loss, and weight gain. The purpose of this lesson is to reinforce (in a group setting) the key concepts around what patients are recommended to eat and how to apply these guidelines when away from home by planning and selecting Pritikin-friendly options. Patients will understand how calorie density may be adjusted for different weight management goals.  Mindful Eating  Clinical staff led group instruction and group discussion with PowerPoint presentation and patient guidebook. To enhance the learning environment the use of posters, models and videos may be added. Patients will briefly review the concepts of the [place] and the importance of low-calorie dense foods. The concept of mindful eating will be introduced as well as the importance of paying attention to internal  hunger signals. Triggers for non-hunger eating and techniques for dealing with triggers will be explored. The purpose of this lesson is to provide patients with the opportunity to review the basic principles of the [place], discuss the value of eating mindfully and how to measure internal cues of hunger and fullness using the Hunger Scale. Patients will also discuss reasons for non-hunger eating and learn strategies to use for controlling emotional eating.  Targeting Your Nutrition Priorities Clinical staff led group instruction and group discussion with PowerPoint presentation and patient guidebook. To enhance the learning environment the use of posters, models and videos may be added. Patients will learn how to determine their genetic susceptibility to disease by reviewing their family history. Patients will gain insight into the  importance of diet as part of an overall healthy lifestyle in mitigating the impact of genetics and other environmental insults. The purpose of this lesson is to provide patients with the opportunity to assess their personal nutrition priorities by looking at their family history, their own health history and current risk factors. Patients will also be able to discuss ways of prioritizing and modifying the [place] for their highest risk areas  Menu  Clinical staff led group instruction and group discussion with PowerPoint presentation and patient guidebook. To enhance the learning environment the use of posters, models and videos may be added. Using menus brought in from [employer], or printed from [employer], patients will apply the [place] dining out guidelines that were presented in the [employer] video. Patients will also be able to practice these guidelines in a variety of provided scenarios. The purpose of this lesson is to provide patients with the opportunity to practice hands-on learning of the [place] with actual menus and practice scenarios.  Label Reading Clinical staff led group instruction and group discussion with PowerPoint presentation and patient guidebook. To enhance the learning environment the use of posters, models and videos may be added. Patients will review and discuss the Pritikin label reading guidelines presented in Pritikin's Label Reading Educational series video. Using fool labels brought in from local grocery stores and markets, patients will apply the label reading guidelines and determine if the packaged food meet the Pritikin guidelines. The purpose of this lesson is to provide patients with the opportunity to review, discuss, and practice hands-on learning of the Pritikin Label Reading guidelines with actual packaged food labels. [place] Workshops are designed to teach  patients ways to prepare quick, simple, and affordable recipes at home. The importance of nutrition's role in chronic disease risk reduction is reflected in its emphasis in the overall Pritikin program. By learning how to prepare essential core Pritikin Eating Plan recipes, patients will increase control over what they eat; be able to customize the flavor of foods without the use of added salt, sugar, or fat; and improve the quality of the food they consume. By learning a set of core recipes which are easily assembled, quickly prepared, and affordable, patients are more likely to prepare more healthy foods at home. These workshops focus on convenient breakfasts, simple entres, side dishes, and desserts which can be prepared with minimal effort and are consistent with nutrition recommendations for cardiovascular risk reduction. Cooking [employer] are taught by a Engineer, materials (RD) who has been trained by the [employer]. The chef or RD has a clear understanding of the importance of minimizing - if not completely eliminating -  added fat, sugar, and sodium in recipes. Throughout the series of [place] Workshop sessions, patients will learn about healthy ingredients and efficient methods of cooking to build confidence in their capability to prepare    Cooking School weekly topics:  Adding Flavor- Sodium-Free  Fast and Healthy Breakfasts  Powerhouse Plant-Based Proteins  Satisfying Salads and Dressings  Simple Sides and Sauces  International Cuisine-Spotlight on the [place] Zones  Delicious Desserts  Savory Soups  [employer] - Meals in a Agricultural consultant Appetizers and Snacks  Comforting Weekend Breakfasts  One-Pot Wonders   Fast Evening Meals  Contractor Your Pritikin Plate  WORKSHOPS   Healthy Mindset (Psychosocial):  Focused Goals, Sustainable Changes Clinical staff led group instruction and group discussion with PowerPoint  presentation and patient guidebook. To enhance the learning environment the use of posters, models and videos may be added. Patients will be able to apply effective goal setting strategies to establish at least one personal goal, and then take consistent, meaningful action toward that goal. They will learn to identify common barriers to achieving personal goals and develop strategies to overcome them. Patients will also gain an understanding of how our mind-set can impact our ability to achieve goals and the importance of cultivating a positive and growth-oriented mind-set. The purpose of this lesson is to provide patients with a deeper understanding of how to set and achieve personal goals, as well as the tools and strategies needed to overcome common obstacles which may arise along the way.  From Head to Heart: The Power of a Healthy Outlook  Clinical staff led group instruction and group discussion with PowerPoint presentation and patient guidebook. To enhance the learning environment the use of posters, models and videos may be added. Patients will be able to recognize and describe the impact of emotions and mood on physical health. They will discover the importance of self-care and explore self-care practices which may work for them. Patients will also learn how to utilize the 4 C's to cultivate a healthier outlook and better manage stress and challenges. The purpose of this lesson is to demonstrate to patients how a healthy outlook is an essential part of maintaining good health, especially as they continue their cardiac rehab journey.  Healthy Sleep for a Healthy Heart Clinical staff led group instruction and group discussion with PowerPoint presentation and patient guidebook. To enhance the learning environment the use of posters, models and videos may be added. At the conclusion of this workshop, patients will be able to demonstrate knowledge of the importance of sleep to overall health, well-being,  and quality of life. They will understand the symptoms of, and treatments for, common sleep disorders. Patients will also be able to identify daytime and nighttime behaviors which impact sleep, and they will be able to apply these tools to help manage sleep-related challenges. The purpose of this lesson is to provide patients with a general overview of sleep and outline the importance of quality sleep. Patients will learn about a few of the most common sleep disorders. Patients will also be introduced to the concept of "sleep hygiene," and discover ways to self-manage certain sleeping problems through simple daily behavior changes. Finally, the workshop will motivate patients by clarifying the links between quality sleep and their goals of heart-healthy living.   Recognizing and Reducing Stress Clinical staff led group instruction and group discussion with PowerPoint presentation and patient guidebook. To enhance the learning environment the use of posters, models and videos may be  added. At the conclusion of this workshop, patients will be able to understand the types of stress reactions, differentiate between acute and chronic stress, and recognize the impact that chronic stress has on their health. They will also be able to apply different coping mechanisms, such as reframing negative self-talk. Patients will have the opportunity to practice a variety of stress management techniques, such as deep abdominal breathing, progressive muscle relaxation, and/or guided imagery.  The purpose of this lesson is to educate patients on the role of stress in their lives and to provide healthy techniques for coping with it.  Learning Barriers/Preferences:  Learning Barriers/Preferences - 11/11/22 1352       Learning Barriers/Preferences   Learning Barriers Sight   pt wears glasses   Learning Preferences Computer/Internet;Group Instruction;Individual Instruction;Pictoral;Skilled Demonstration;Verbal  Instruction;Video;Written Material             Education Topics:  Knowledge Questionnaire Score:  Knowledge Questionnaire Score - 11/11/22 1353       Knowledge Questionnaire Score   Pre Score 19/24             Core Components/Risk Factors/Patient Goals at Admission:  Personal Goals and Risk Factors at Admission - 11/11/22 1354       Core Components/Risk Factors/Patient Goals on Admission    Weight Management Yes;Obesity;Weight Loss    Intervention Weight Management: Provide education and appropriate resources to help participant work on and attain dietary goals.;Weight Management: Develop a combined nutrition and exercise program designed to reach desired caloric intake, while maintaining appropriate intake of nutrient and fiber, sodium and fats, and appropriate energy expenditure required for the weight goal.;Weight Management/Obesity: Establish reasonable short term and long term weight goals.;Obesity: Provide education and appropriate resources to help participant work on and attain dietary goals.    Admit Weight 225 lb 1.4 oz (102.1 kg)    Goal Weight: Short Term 215 lb (97.5 kg)    Goal Weight: Long Term 205 lb (93 kg)    Expected Outcomes Short Term: Continue to assess and modify interventions until short term weight is achieved;Long Term: Adherence to nutrition and physical activity/exercise program aimed toward attainment of established weight goal;Understanding recommendations for meals to include 15-35% energy as protein, 25-35% energy from fat, 35-60% energy from carbohydrates, less than 200mg  of dietary cholesterol, 20-35 gm of total fiber daily;Understanding of distribution of calorie intake throughout the day with the consumption of 4-5 meals/snacks;Weight Loss: Understanding of general recommendations for a balanced deficit meal plan, which promotes 1-2 lb weight loss per week and includes a negative energy balance of [phone] kcal/d    Hypertension Yes     Intervention Provide education on lifestyle modifcations including regular physical activity/exercise, weight management, moderate sodium restriction and increased consumption of fresh fruit, vegetables, and low fat dairy, alcohol moderation, and smoking cessation.;Monitor prescription use compliance.    Expected Outcomes Short Term: Continued assessment and intervention until BP is < 140/64mm HG in hypertensive participants. < 130/30mm HG in hypertensive participants with diabetes, heart failure or chronic kidney disease.;Long Term: Maintenance of blood pressure at goal levels.    Lipids Yes    Intervention Provide education and support for participant on nutrition & aerobic/resistive exercise along with prescribed medications to achieve LDL 70mg , HDL >40mg .    Expected Outcomes Short Term: Participant states understanding of desired cholesterol values and is compliant with medications prescribed. Participant is following exercise prescription and nutrition guidelines.;Long Term: Cholesterol controlled with medications as prescribed, with individualized exercise RX and with personalized nutrition  plan. Value goals: LDL < 70mg , HDL > 40 mg.             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 Introduction to pritikin education/ intensive cardiac rehab. Initial orientation packet reviewed with patient.                Comments: Participant attended orientation for the cardiac rehabilitation program on  11/11/2022  to perform initial intake and exercise walk test. Patient introduced to the [address] education and orientation packet was reviewed. Completed 6-minute walk test, measurements, initial ITP, and exercise prescription. Vital signs stable. Telemetry-normal sinus rhythm with  occ PVCs, asymptomatic. PVCs not previously noted, RN Car[place] notified.   Service time was from 1003 to 1201.  Colbert Ewing, MS 11/11/2022 2:01 PM

## 2022-11-16 ENCOUNTER — Encounter (HOSPITAL_COMMUNITY): Payer: Medicare HMO

## 2022-11-18 ENCOUNTER — Encounter (HOSPITAL_COMMUNITY)
Admission: RE | Admit: 2022-11-18 | Discharge: 2022-11-18 | Disposition: A | Discharge status: Home / Self Care | Payer: Medicare HMO | Source: Ambulatory Visit | Attending: Cardiology | Admitting: Cardiology

## 2022-11-18 DIAGNOSIS — Z955 Presence of coronary angioplasty implant and graft: Secondary | ICD-10-CM

## 2022-11-18 DIAGNOSIS — Z48812 Encounter for surgical aftercare following surgery on the circulatory system: Secondary | ICD-10-CM | POA: Diagnosis not present

## 2022-11-18 NOTE — Progress Notes (Signed)
Daily Session Note  Patient Details  Name: Bryan Hays MRN: [id number] Date of Birth: 1946-02-17 Referring Provider:   Flowsheet Row INTENSIVE CARDIAC REHAB ORIENT from 11/11/2022 in [facility] [place]  Referring Provider Bryan Ruths, MD       Encounter Date: 11/18/2022  Check In:  Session Check In - 11/18/22 1313       Check-In   Supervising physician immediately available to respond to emergencies [facility] MD immediately available    Physician(s) Bryan Bame, NP    Location [facility]    Staff Present Bryan Salaam, MS, Exercise Physiologist;Bryan Celesta Aver, MS, ACSM-CEP, Exercise Physiologist;Bryan Starleen Blue, MS, Exercise Physiologist;Bryan Hays BS, ACSM-CEP, Exercise Physiologist;Bryan Sandler, RN, BSN    Virtual Visit No    Medication changes reported     No    Fall or balance concerns reported    No    Tobacco Cessation No Change    Warm-up and Cool-down Performed as group-led instruction    Resistance Training Performed No    VAD Patient? No    PAD/SET Patient? No      Pain Assessment   Currently in Pain? No/denies    Pain Score 0-No pain    Multiple Pain Sites No             Capillary Blood Glucose: No results found for this or any previous visit (from the past 24 hour(s)).   Exercise Prescription Changes - 11/18/22 1400       Response to Exercise   Blood Pressure (Admit) 134/68    Blood Pressure (Exercise) 142/68    Blood Pressure (Exit) 122/56    Heart Rate (Admit) 69 bpm    Heart Rate (Exercise) 95 bpm    Heart Rate (Exit) 78 bpm    Rating of Perceived Exertion (Exercise) 12    Symptoms none    Comments --   pt first day   Duration Continue with 30 min of aerobic exercise without signs/symptoms of physical distress.    Intensity THRR unchanged      Progression   Progression Continue to progress workloads to maintain intensity without signs/symptoms of physical distress.     Average METs 2.9      Resistance Training   Training Prescription --   no weights on Wed     Bike   Level 1.5    Minutes 15    METs 2.6      Recumbant Elliptical   Level 2    Minutes 15    METs 3.2             Social History   Tobacco Use  Smoking Status Never  Smokeless Tobacco Never    Goals Met:  Exercise tolerated well No report of concerns or symptoms today  Goals Unmet:  Not Applicable  Comments: Pt started cardiac rehab today.  Pt tolerated light exercise without difficulty. VSS, telemetry-Sinus Rhythm, asymptomatic.  Medication list reconciled. Pt denies barriers to medicaiton compliance.  PSYCHOSOCIAL ASSESSMENT:  PHQ-1. Pt exhibits positive coping skills, hopeful outlook with supportive family. No psychosocial needs identified at this time, no psychosocial interventions necessary.    Pt enjoys golf, spending time with family and sports . Pt oriented to exercise equipment and routine.    Understanding verbalized.Bryan Gave RN BSN    Dr. Fransico Hays is Medical Director for Cardiac Rehab at [facility].

## 2022-11-19 ENCOUNTER — Other Ambulatory Visit (HOSPITAL_COMMUNITY): Payer: Self-pay

## 2022-11-20 ENCOUNTER — Encounter (HOSPITAL_COMMUNITY)
Admission: RE | Admit: 2022-11-20 | Discharge: 2022-11-20 | Disposition: A | Discharge status: Home / Self Care | Payer: Medicare HMO | Source: Ambulatory Visit | Attending: Cardiology | Admitting: Cardiology

## 2022-11-20 DIAGNOSIS — Z955 Presence of coronary angioplasty implant and graft: Secondary | ICD-10-CM

## 2022-11-20 DIAGNOSIS — Z48812 Encounter for surgical aftercare following surgery on the circulatory system: Secondary | ICD-10-CM | POA: Diagnosis not present

## 2022-11-23 ENCOUNTER — Encounter (HOSPITAL_COMMUNITY)
Admission: RE | Admit: 2022-11-23 | Discharge: 2022-11-23 | Disposition: A | Discharge status: Home / Self Care | Payer: Medicare HMO | Source: Ambulatory Visit | Attending: Cardiology | Admitting: Cardiology

## 2022-11-23 DIAGNOSIS — Z48812 Encounter for surgical aftercare following surgery on the circulatory system: Secondary | ICD-10-CM | POA: Insufficient documentation

## 2022-11-23 DIAGNOSIS — Z955 Presence of coronary angioplasty implant and graft: Secondary | ICD-10-CM | POA: Diagnosis not present

## 2022-11-25 ENCOUNTER — Encounter (HOSPITAL_COMMUNITY): Payer: Medicare HMO

## 2022-11-27 ENCOUNTER — Encounter (HOSPITAL_COMMUNITY)
Admission: RE | Admit: 2022-11-27 | Discharge: 2022-11-27 | Disposition: A | Discharge status: Home / Self Care | Payer: Medicare HMO | Source: Ambulatory Visit | Attending: Cardiology | Admitting: Cardiology

## 2022-11-27 DIAGNOSIS — Z955 Presence of coronary angioplasty implant and graft: Secondary | ICD-10-CM | POA: Diagnosis not present

## 2022-11-27 DIAGNOSIS — Z48812 Encounter for surgical aftercare following surgery on the circulatory system: Secondary | ICD-10-CM | POA: Diagnosis not present

## 2022-11-30 ENCOUNTER — Encounter (HOSPITAL_COMMUNITY)
Admission: RE | Admit: 2022-11-30 | Discharge: 2022-11-30 | Disposition: A | Discharge status: Home / Self Care | Payer: Medicare HMO | Source: Ambulatory Visit | Attending: Cardiology | Admitting: Cardiology

## 2022-11-30 DIAGNOSIS — Z48812 Encounter for surgical aftercare following surgery on the circulatory system: Secondary | ICD-10-CM | POA: Diagnosis not present

## 2022-11-30 DIAGNOSIS — Z955 Presence of coronary angioplasty implant and graft: Secondary | ICD-10-CM

## 2022-12-01 ENCOUNTER — Ambulatory Visit: Payer: Medicare HMO | Attending: Cardiovascular Disease | Admitting: Pharmacist

## 2022-12-01 ENCOUNTER — Encounter: Payer: Self-pay | Admitting: Pharmacist

## 2022-12-01 ENCOUNTER — Telehealth: Payer: Self-pay | Admitting: Pharmacist

## 2022-12-01 DIAGNOSIS — K9049 Malabsorption due to intolerance, not elsewhere classified: Secondary | ICD-10-CM | POA: Insufficient documentation

## 2022-12-01 DIAGNOSIS — E785 Hyperlipidemia, unspecified: Secondary | ICD-10-CM

## 2022-12-01 DIAGNOSIS — R931 Abnormal findings on diagnostic imaging of heart and coronary circulation: Secondary | ICD-10-CM | POA: Diagnosis not present

## 2022-12-01 DIAGNOSIS — I2510 Atherosclerotic heart disease of native coronary artery without angina pectoris: Secondary | ICD-10-CM

## 2022-12-01 DIAGNOSIS — Z6835 Body mass index (BMI) 35.0-35.9, adult: Secondary | ICD-10-CM | POA: Insufficient documentation

## 2022-12-01 DIAGNOSIS — I209 Angina pectoris, unspecified: Secondary | ICD-10-CM | POA: Insufficient documentation

## 2022-12-01 DIAGNOSIS — R7303 Prediabetes: Secondary | ICD-10-CM | POA: Insufficient documentation

## 2022-12-01 DIAGNOSIS — I77819 Aortic ectasia, unspecified site: Secondary | ICD-10-CM | POA: Insufficient documentation

## 2022-12-01 NOTE — Patient Instructions (Signed)
It was nice meeting you today  We would like your LDL (bad cholesterol) to be less than 70  Please continue your rosuvastatin 40mg  once a day  Continue healthy eating and physical activity as tolerated  We would like to start a new medication called Repatha which you would inject once every 2 weeks  Once you start the medication we will recheck your cholesterol in 2-3 months  I will complete the prior authorization for you and contact you when its approved  Please contact me if you have any questions  Laural Golden, PharmD, BCACP, CDCES, CPP [address], [address] [place], [place], [address] Phone: [phone], Fax: [phone]

## 2022-12-01 NOTE — Progress Notes (Signed)
Patient ID: Bryan Hays                 DOB: [birth year]                    MRN: [id number]     HPI: Bryan Hays is a 77 y.o. male patient referred to lipid clinic by Bryan Hays. Patient of Dr Jens Som. PMH is significant for CAD, HLD, elevated coronary calcium score, and obesity. Had coronary artery stent placed on 10/15/22.  Patient presents today to discuss lipid management. Currently taking rosuvastatin 40mg  daily after it was increased by Dr Jens Som on 2/21. Was previously on 5mg . Dr Jens Som recommended cardiac cath the next day.  Has been going to cardiac rehab weekly since end of March. Enjoys the exercise, not the food choices. Reports his wife is an excellent cook and he had a history of over eating. He is now walking and golfing with less SOB.  Has had no adverse effects to increase in rosuvastatin.   Family history of CAD on father's side.  Current Medications:  Rosuvastatin 40mg  daily  Intolerances: N/A  Risk Factors:  CAD Family history Elevated coronary calcium score  LDL goal: <70  Labs:LPA: 66.8, TC 137, HDL 43, LDL 74, trigs 101 (11/02/22)  Imaging:  1. Three-vessel coronary artery calcification.   2. Total Agatston Score: 3,834   3. MESA age, race  and sex matched database percentile: 96th  Past Medical History:  Diagnosis Date   Allergic rhinitis due to pollen    CAD (coronary artery disease)    Degeneration of lumbar or lumbosacral intervertebral disc    GERD (gastroesophageal reflux disease)    Hyperlipidemia    Hypertension    Nephrolithiasis    Personal history of urinary calculi    Tendinitis     Current Outpatient Medications on File Prior to Visit  Medication Sig Dispense Refill   acetaminophen (TYLENOL) 650 MG CR tablet Take 650 mg by mouth every 8 (eight) hours as needed for pain.     aspirin 81 MG EC tablet Take 81 mg by mouth daily.       cholecalciferol (VITAMIN D3) 25 MCG (1000 UNIT) tablet Take 1,000 Units by mouth daily.      clopidogrel (PLAVIX) 75 MG tablet Take 1 tablet (75 mg total) by mouth daily with breakfast. 90 tablet 3   cyanocobalamin (VITAMIN B12) 1000 MCG tablet Take 1,000 mcg by mouth daily.     gabapentin (NEURONTIN) 300 MG capsule Take 300 mg by mouth at bedtime.     lisinopril (PRINIVIL,ZESTRIL) 5 MG tablet Take 5 mg by mouth daily.  0   metoprolol succinate (TOPROL XL) 25 MG 24 hr tablet Take 1 tablet (25 mg total) by mouth daily. 90 tablet 3   neomycin-polymyxin-dexamethasone (MAXITROL) 0.1 % ophthalmic suspension Place 1 drop into both eyes daily as needed (irritation).     nitroGLYCERIN (NITROSTAT) 0.4 MG SL tablet Place 1 tablet (0.4 mg total) under the tongue every 5 (five) minutes as needed for chest pain. 25 tablet 3   pantoprazole (PROTONIX) 40 MG tablet Take 1 tablet (40 mg total) by mouth daily. 90 tablet 3   Phenylephrine-APAP-guaiFENesin (MUCINEX FAST-MAX) 10-650-400 MG/20ML LIQD Take 20 mLs by mouth daily as needed (congestion).     rosuvastatin (CRESTOR) 40 MG tablet Take 1 tablet (40 mg total) by mouth daily. TAKE 1 TABLET BY MOUTH EVERY DAY 90 tablet 3   No current facility-administered medications on file prior  to visit.    Allergies  Allergen Reactions   Banana Nausea And Vomiting    G.I. UPSET   Sulfonamide Derivatives Nausea And Vomiting    Assessment/Plan:  1. Hyperlipidemia - Patient's most recent LDL 74 which is above goal of <70 however has only been on rosuvastatin 40 for a month. Due to extremely elevated CAC score, recommend starting PCSK9i.  Using  [employer], educated patient on mechanism of action, storage, site selection, administration, and possible adverse effects. Will complete PA and contact patient when approved. Recheck lipid panel in 2-3 months.  Continue rosuvastatin 40mg  daily Start Repatha 140mg  q 14 days Recheck lipid panel in 2-3 months  Laural Golden, PharmD, BCACP, CDCES, CPP [address], [address] [place], [place], [address] Phone:  [phone], Fax: [phone]

## 2022-12-01 NOTE — Telephone Encounter (Signed)
PA for Repatha submitted. Key: [id number]

## 2022-12-02 ENCOUNTER — Encounter (HOSPITAL_COMMUNITY)
Admission: RE | Admit: 2022-12-02 | Discharge: 2022-12-02 | Disposition: A | Discharge status: Home / Self Care | Payer: Medicare HMO | Source: Ambulatory Visit | Attending: Cardiology | Admitting: Cardiology

## 2022-12-02 DIAGNOSIS — Z955 Presence of coronary angioplasty implant and graft: Secondary | ICD-10-CM | POA: Diagnosis not present

## 2022-12-02 DIAGNOSIS — Z48812 Encounter for surgical aftercare following surgery on the circulatory system: Secondary | ICD-10-CM | POA: Diagnosis not present

## 2022-12-02 MED ORDER — REPATHA SURECLICK 140 MG/ML ~~LOC~~ SOAJ: 1.0000 mL | SUBCUTANEOUS | 3 refills | Status: DC

## 2022-12-02 NOTE — Telephone Encounter (Signed)
Repatha PA approved through 08/24/23 

## 2022-12-02 NOTE — Addendum Note (Signed)
Addended by: Cheree Ditto on: 12/02/2022 07:30 AM   Modules accepted: Orders

## 2022-12-03 NOTE — Progress Notes (Signed)
Cardiac Individual Treatment Plan  Patient Details  Name: Bryan Hays MRN: [id number] Date of Birth: March 30, 1946 Referring Provider:   Flowsheet Row INTENSIVE CARDIAC REHAB ORIENT from 11/11/2022 in [facility]  Referring Provider Olga Millers, MD       Initial Encounter Date:  Flowsheet Row INTENSIVE CARDIAC REHAB ORIENT from 11/11/2022 in [facility]  Date 11/11/22       Visit Diagnosis: S/P drug eluting coronary stent placement  Patient's Home Medications on Admission:  Current Outpatient Medications:    Evolocumab (REPATHA SURECLICK) 140 MG/ML SOAJ, Inject 140 mg into the skin every 14 (fourteen) days., Disp: 6 mL, Rfl: 3   acetaminophen (TYLENOL) 650 MG CR tablet, Take 650 mg by mouth every 8 (eight) hours as needed for pain., Disp: , Rfl:    aspirin 81 MG EC tablet, Take 81 mg by mouth daily.  , Disp: , Rfl:    cholecalciferol (VITAMIN D3) 25 MCG (1000 UNIT) tablet, Take 1,000 Units by mouth daily., Disp: , Rfl:    clopidogrel (PLAVIX) 75 MG tablet, Take 1 tablet (75 mg total) by mouth daily with breakfast., Disp: 90 tablet, Rfl: 3   cyanocobalamin (VITAMIN B12) 1000 MCG tablet, Take 1,000 mcg by mouth daily., Disp: , Rfl:    gabapentin (NEURONTIN) 300 MG capsule, Take 300 mg by mouth at bedtime., Disp: , Rfl:    lisinopril (PRINIVIL,ZESTRIL) 5 MG tablet, Take 5 mg by mouth daily., Disp: , Rfl: 0   metoprolol succinate (TOPROL XL) 25 MG 24 hr tablet, Take 1 tablet (25 mg total) by mouth daily., Disp: 90 tablet, Rfl: 3   neomycin-polymyxin-dexamethasone (MAXITROL) 0.1 % ophthalmic suspension, Place 1 drop into both eyes daily as needed (irritation)., Disp: , Rfl:    nitroGLYCERIN (NITROSTAT) 0.4 MG SL tablet, Place 1 tablet (0.4 mg total) under the tongue every 5 (five) minutes as needed for chest pain., Disp: 25 tablet, Rfl: 3   pantoprazole (PROTONIX) 40 MG  tablet, Take 1 tablet (40 mg total) by mouth daily., Disp: 90 tablet, Rfl: 3   Phenylephrine-APAP-guaiFENesin (MUCINEX FAST-MAX) 10-650-400 MG/20ML LIQD, Take 20 mLs by mouth daily as needed (congestion)., Disp: , Rfl:    rosuvastatin (CRESTOR) 40 MG tablet, Take 1 tablet (40 mg total) by mouth daily. TAKE 1 TABLET BY MOUTH EVERY DAY, Disp: 90 tablet, Rfl: 3  Past Medical History: Past Medical History:  Diagnosis Date   Allergic rhinitis due to pollen    CAD (coronary artery disease)    Degeneration of lumbar or lumbosacral intervertebral disc    GERD (gastroesophageal reflux disease)    Hyperlipidemia    Hypertension    Nephrolithiasis    Personal history of urinary calculi    Tendinitis     Tobacco Use: Social History   Tobacco Use  Smoking Status Never  Smokeless Tobacco Never    Labs: Review Flowsheet  More data exists      Latest Ref Rng & Units 02/11/2009 02/11/2010 02/13/2011 03/09/2012 03/16/2013  Labs for ITP Cardiac and Pulmonary Rehab  Cholestrol 0 - 200 mg/dL 578  469  629  528  413   LDL (calc) 0 - 99 mg/dL 244  91  010  272  536   HDL-C >39.00 mg/dL 64.40  34.74  25.95  63.87  47.40   Trlycerides 0.0 - 149.0 mg/dL 56.4  332.9  518.8  41.6  85.0  Capillary Blood Glucose: No results found for: "GLUCAP"   Exercise Target Goals: Exercise Program Goal: Individual exercise prescription set using results from initial 6 min walk test and THRR while considering  patient's activity barriers and safety.   Exercise Prescription Goal: Initial exercise prescription builds to 30-45 minutes a day of aerobic activity, 2-3 days per week.  Home exercise guidelines will be given to patient during program as part of exercise prescription that the participant will acknowledge.  Activity Barriers & Risk Stratification:  Activity Barriers & Cardiac Risk Stratification - 11/11/22 1344       Activity Barriers & Cardiac Risk Stratification   Activity Barriers Shortness of  Breath;Joint Problems;Back Problems    Cardiac Risk Stratification High   < 5 METs on            6 Minute Walk:  6 Minute Walk     Row Name 11/11/22 1343         6 Minute Walk   Phase Initial     Distance 1724 feet     Walk Time 6 minutes     # of Rest Breaks 0     MPH 3.27     METS 2.89     RPE 12     Perceived Dyspnea  1     VO2 Peak 10.14     Symptoms Yes (comment)     Comments mild SOB, resolved with rest. no pain     Resting HR 62 bpm     Resting BP 106/62     Resting Oxygen Saturation  96 %     Exercise Oxygen Saturation  during 6 min walk 98 %     Max Ex. HR 108 bpm     Max Ex. BP 130/62     2 Minute Post BP 104/66              Oxygen Initial Assessment:   Oxygen Re-Evaluation:   Oxygen Discharge (Final Oxygen Re-Evaluation):   Initial Exercise Prescription:  Initial Exercise Prescription - 11/11/22 1300       Date of Initial Exercise RX and Referring Provider   Date 11/11/22    Referring Provider Olga Millers, MD    Expected Discharge Date 01/22/23      Bike   Level 1.5    Watts 20    Minutes 15    METs 2.5      Recumbant Elliptical   Level 2    RPM 50    Watts 80    Minutes 15    METs 2.5      Prescription Details   Frequency (times per week) 3    Duration Progress to 30 minutes of continuous aerobic without signs/symptoms of physical distress      Intensity   THRR 40-80% of Max Heartrate 57-115    Ratings of Perceived Exertion 11-13    Perceived Dyspnea 0-4      Progression   Progression Continue progressive overload as per policy without signs/symptoms or physical distress.      Resistance Training   Training Prescription Yes    Weight 3    Reps 10-15             Perform Capillary Blood Glucose checks as needed.  Exercise Prescription Changes:   Exercise Prescription Changes     Row Name 11/18/22 1400 11/27/22 1600           Response to Exercise   Blood Pressure (Admit) 134/68 122/66  Blood Pressure (Exercise) 142/68 158/72      Blood Pressure (Exit) 122/56 112/80      Heart Rate (Admit) 69 bpm 67 bpm      Heart Rate (Exercise) 95 bpm 102 bpm      Heart Rate (Exit) 78 bpm 76 bpm      Rating of Perceived Exertion (Exercise) 12 12      Symptoms none None      Comments --  pt first day Reviewed METs      Duration Continue with 30 min of aerobic exercise without signs/symptoms of physical distress. Continue with 30 min of aerobic exercise without signs/symptoms of physical distress.      Intensity THRR unchanged THRR unchanged        Progression   Progression Continue to progress workloads to maintain intensity without signs/symptoms of physical distress. Continue to progress workloads to maintain intensity without signs/symptoms of physical distress.      Average METs 2.9 4        Resistance Training   Training Prescription --  no weights on Wed Yes      Weight -- 3      Reps -- 10-15      Time -- 10 Minutes        Interval Training   Interval Training -- No        Bike   Level 1.5 2      Minutes 15 15      METs 2.6 2.9        Recumbant Elliptical   Level 2 2      RPM -- 74      Watts -- 116      Minutes 15 15      METs 3.2 5.1               Exercise Comments:   Exercise Comments     Row Name 11/18/22 1429 11/27/22 1407         Exercise Comments Pt did great on his first day, will contune to moniotr and increase WL as tolerated. Reviewed METs. Pt is making progress in the CRP2 program.               Exercise Goals and Review:   Exercise Goals     Row Name 11/11/22 1350             Exercise Goals   Increase Physical Activity Yes       Intervention Provide advice, education, support and counseling about physical activity/exercise needs.;Develop an individualized exercise prescription for aerobic and resistive training based on initial evaluation findings, risk stratification, comorbidities and participant's personal goals.        Expected Outcomes Short Term: Attend rehab on a regular basis to increase amount of physical activity.;Long Term: Exercising regularly at least 3-5 days a week.;Long Term: Add in home exercise to make exercise part of routine and to increase amount of physical activity.       Increase Strength and Stamina Yes       Intervention Provide advice, education, support and counseling about physical activity/exercise needs.;Develop an individualized exercise prescription for aerobic and resistive training based on initial evaluation findings, risk stratification, comorbidities and participant's personal goals.       Expected Outcomes Short Term: Perform resistance training exercises routinely during rehab and add in resistance training at home;Short Term: Increase workloads from initial exercise prescription for resistance, speed, and METs.;Long Term: Improve cardiorespiratory fitness, muscular endurance and strength  as measured by increased METs and functional capacity ( )       Able to understand and use rate of perceived exertion (RPE) scale Yes       Intervention Provide education and explanation on how to use RPE scale       Expected Outcomes Short Term: Able to use RPE daily in rehab to express subjective intensity level;Long Term:  Able to use RPE to guide intensity level when exercising independently       Able to understand and use Dyspnea scale Yes       Intervention Provide education and explanation on how to use Dyspnea scale       Expected Outcomes Short Term: Able to use Dyspnea scale daily in rehab to express subjective sense of shortness of breath during exertion;Long Term: Able to use Dyspnea scale to guide intensity level when exercising independently       Knowledge and understanding of Target Heart Rate Range (THRR) Yes       Intervention Provide education and explanation of THRR including how the numbers were predicted and where they are located for reference       Expected Outcomes Long  Term: Able to use THRR to govern intensity when exercising independently;Short Term: Able to state/look up THRR;Short Term: Able to use daily as guideline for intensity in rehab       Understanding of Exercise Prescription Yes       Intervention Provide education, explanation, and written materials on patient's individual exercise prescription       Expected Outcomes Short Term: Able to explain program exercise prescription;Long Term: Able to explain home exercise prescription to exercise independently                Exercise Goals Re-Evaluation :  Exercise Goals Re-Evaluation     Row Name 11/18/22 1419             Exercise Goal Re-Evaluation   Exercise Goals Review Increase Physical Activity;Increase Strength and Stamina;Able to understand and use rate of perceived exertion (RPE) scale;Knowledge and understanding of Target Heart Rate Range (THRR);Understanding of Exercise Prescription       Comments Pt first day in program, pt exercised for 30 minutes averging 2.9 Mets w/o abnormal s/s. Pt was educated on RPE scale and will continue to educate pt on his ExRx, THRR, and RPE scale while increasing his WL as tolerated.       Expected Outcomes Pt is very motivated to reach his goals, will continue to educate pt on RPE, THRR, ExRx, and importance of exercise.                Discharge Exercise Prescription (Final Exercise Prescription Changes):  Exercise Prescription Changes - 11/27/22 1600       Response to Exercise   Blood Pressure (Admit) 122/66    Blood Pressure (Exercise) 158/72    Blood Pressure (Exit) 112/80    Heart Rate (Admit) 67 bpm    Heart Rate (Exercise) 102 bpm    Heart Rate (Exit) 76 bpm    Rating of Perceived Exertion (Exercise) 12    Symptoms None    Comments Reviewed METs    Duration Continue with 30 min of aerobic exercise without signs/symptoms of physical distress.    Intensity THRR unchanged      Progression   Progression Continue to progress  workloads to maintain intensity without signs/symptoms of physical distress.    Average METs 4      Resistance Training  Training Prescription Yes    Weight 3    Reps 10-15    Time 10 Minutes      Interval Training   Interval Training No      Bike   Level 2    Minutes 15    METs 2.9      Recumbant Elliptical   Level 2    RPM 74    Watts 116    Minutes 15    METs 5.1             Nutrition:  Target Goals: Understanding of nutrition guidelines, daily intake of sodium 1500mg , cholesterol 200mg , calories 30% from fat and 7% or less from saturated fats, daily to have 5 or more servings of fruits and vegetables.  Biometrics:  Pre Biometrics - 11/11/22 1340       Pre Biometrics   Waist Circumference 49 inches    Hip Circumference 47.5 inches    Waist to Hip Ratio 1.03 %    Triceps Skinfold 35 mm    % Body Fat 38.9 %    Grip Strength 28 kg    Flexibility 0 in   could not reach   Single Leg Stand 5.12 seconds              Nutrition Therapy Plan and Nutrition Goals:  Nutrition Therapy & Goals - 11/19/22 0821       Nutrition Therapy   Diet Heart Healthy/Carbohydrate Consistent Diet    Drug/Food Interactions Statins/Certain Fruits      Personal Nutrition Goals   Nutrition Goal Patient to identify strategies for reducing cardiovascular risk by attending the [employer] education and nutrition series weekly.    Personal Goal #2 Patient to improve diet quality by using the plate method as a guide for meal planning to include lean protein/plant protein, fruits, vegetables, whole grains, nonfat dairy as part of a well-balanced diet.    Personal Goal #3 Patient to limit to 1500mg  of sodium per day    Personal Goal #4 Patient to identify food sources of saturated fat, trans fat, sodium, and refined carbohydrates.    Comments Bryan Hays reports that he does no cooking, that his wife does the grocery shopping and cooking at home. He was recently diagnosed with DM2 at PCP  appointment on 11/05/2022; no diabetes medications were added. Bryan Hays will benefit from participation in intensive cardiac rehab for nutrition, exercise, and lifestyle modification.      Intervention Plan   Intervention Prescribe, educate and counsel regarding individualized specific dietary modifications aiming towards targeted core components such as weight, hypertension, lipid management, diabetes, heart failure and other comorbidities.;Nutrition handout(s) given to patient.    Expected Outcomes Short Term Goal: Understand basic principles of dietary content, such as calories, fat, sodium, cholesterol and nutrients.;Long Term Goal: Adherence to prescribed nutrition plan.             Nutrition Assessments:  MEDIFICTS Score Key: ?70 Need to make dietary changes  40-70 Heart Healthy Diet ? 40 Therapeutic Level Cholesterol Diet    Picture Your Plate Scores: <93 Unhealthy dietary pattern with much room for improvement. 41-50 Dietary pattern unlikely to meet recommendations for good health and room for improvement. 51-60 More healthful dietary pattern, with some room for improvement.  >60 Healthy dietary pattern, although there may be some specific behaviors that could be improved.    Nutrition Goals Re-Evaluation:  Nutrition Goals Re-Evaluation     Row Name 11/19/22 [phone]  Goals   Current Weight 225 lb 12 oz (102.4 kg)       Comment Lipids WNL, A1c 6.5       Expected Outcome Bryan Hays reports that he does no cooking, that his wife does the grocery shopping and cooking at home. He was recently diagnosed with DM2 at PCP appointment on 11/05/2022; no diabetes medications were added. Bryan Hays will benefit from participation in intensive cardiac rehab for nutrition, exercise, and lifestyle modification                Nutrition Goals Re-Evaluation:  Nutrition Goals Re-Evaluation     Row Name 11/19/22 0821             Goals   Current Weight 225 lb 12 oz (102.4 kg)        Comment Lipids WNL, A1c 6.5       Expected Outcome Bryan Hays reports that he does no cooking, that his wife does the grocery shopping and cooking at home. He was recently diagnosed with DM2 at PCP appointment on 11/05/2022; no diabetes medications were added. Bryan Hays will benefit from participation in intensive cardiac rehab for nutrition, exercise, and lifestyle modification                Nutrition Goals Discharge (Final Nutrition Goals Re-Evaluation):  Nutrition Goals Re-Evaluation - 11/19/22 6213       Goals   Current Weight 225 lb 12 oz (102.4 kg)    Comment Lipids WNL, A1c 6.5    Expected Outcome Bryan Hays reports that he does no cooking, that his wife does the grocery shopping and cooking at home. He was recently diagnosed with DM2 at PCP appointment on 11/05/2022; no diabetes medications were added. Bryan Hays will benefit from participation in intensive cardiac rehab for nutrition, exercise, and lifestyle modification             Psychosocial: Target Goals: Acknowledge presence or absence of significant depression and/or stress, maximize coping skills, provide positive support system. Participant is able to verbalize types and ability to use techniques and skills needed for reducing stress and depression.  Initial Review & Psychosocial Screening:  Initial Psych Review & Screening - 11/11/22 1351       Initial Review   Current issues with None Identified      Family Dynamics   Good Support System? Yes   Bryan Hays has his wife for support     Barriers   Psychosocial barriers to participate in program There are no identifiable barriers or psychosocial needs.      Screening Interventions   Interventions Encouraged to exercise;Provide feedback about the scores to participant    Expected Outcomes Long Term goal: The participant improves quality of Life and PHQ9 Scores as seen by post scores and/or verbalization of changes;Short Term goal: Identification and review with participant of any Quality  of Life or Depression concerns found by scoring the questionnaire.             Quality of Life Scores:  Quality of Life - 11/11/22 1352       Quality of Life   Select Quality of Life      Quality of Life Scores   Health/Function Pre 28.63 %    Socioeconomic Pre 27.58 %    Psych/Spiritual Pre 30 %    Family Pre 30 %    GLOBAL Pre 28.94 %            Scores of 19 and below usually indicate a poorer quality of  life in these areas.  A difference of  2-3 points is a clinically meaningful difference.  A difference of 2-3 points in the total score of the Quality of Life Index has been associated with significant improvement in overall quality of life, self-image, physical symptoms, and general health in studies assessing change in quality of life.  PHQ-9: Review Flowsheet       11/11/2022 12/13/2017 09/21/2015  Depression screen PHQ 2/9  Decreased Interest 0 0 0  Down, Depressed, Hopeless 0 0 0  PHQ - 2 Score 0 0 0  Altered sleeping 0 - -  Tired, decreased energy 1 - -  Change in appetite 0 - -  Feeling bad or failure about yourself  0 - -  Trouble concentrating 0 - -  Moving slowly or fidgety/restless 0 - -  Suicidal thoughts 0 - -  PHQ-9 Score 1 - -  Difficult doing work/chores Somewhat difficult - -   Interpretation of Total Score  Total Score Depression Severity:  1-4 = Minimal depression, 5-9 = Mild depression, 10-14 = Moderate depression, 15-19 = Moderately severe depression, 20-27 = Severe depression   Psychosocial Evaluation and Intervention:   Psychosocial Re-Evaluation:  Psychosocial Re-Evaluation     Row Name 11/20/22 0950 12/03/22 1638           Psychosocial Re-Evaluation   Current issues with None Identified None Identified      Interventions Encouraged to attend Cardiac Rehabilitation for the exercise Encouraged to attend Cardiac Rehabilitation for the exercise      Continue Psychosocial Services  No Follow up required No Follow up required                Psychosocial Discharge (Final Psychosocial Re-Evaluation):  Psychosocial Re-Evaluation - 12/03/22 1638       Psychosocial Re-Evaluation   Current issues with None Identified    Interventions Encouraged to attend Cardiac Rehabilitation for the exercise    Continue Psychosocial Services  No Follow up required             Vocational Rehabilitation: Provide vocational rehab assistance to qualifying candidates.   Vocational Rehab Evaluation & Intervention:  Vocational Rehab - 11/11/22 1354       Initial Vocational Rehab Evaluation & Intervention   Assessment shows need for Vocational Rehabilitation No   Bryan Hays is retired            Education: Education Goals: Education classes will be provided on a weekly basis, covering required topics. Participant will state understanding/return demonstration of topics presented.    Education     Row Name 11/18/22 1300     Education   Cardiac Education Topics Pritikin   Orthoptist   Educator Dietitian   Weekly Topic Tasty Appetizers and Snacks   Instruction Review Code 1- Verbalizes Understanding   Class Start Time 1150   Class Stop Time 1226   Class Time Calculation (min) 36 min    Row Name 11/20/22 1200     Education   Cardiac Education Topics Pritikin   Select Core Videos     Core Videos   Educator Dietitian   Select Nutrition   Nutrition Other   Instruction Review Code 1- Verbalizes Understanding   Class Start Time 1150   Class Stop Time 1226   Class Time Calculation (min) 36 min    Row Name 11/23/22 1300     Education   Cardiac Education Topics [employer]ikin   [employer]  Workshops   Biomedical scientist Exercise   Exercise Workshop Exercise Basics: Diplomatic Services operational officer   Instruction Review Code 1- Verbalizes Understanding   Class Start Time 1150   Class Stop Time 1235   Class Time Calculation (min) 45 min    Row Name 11/27/22 1300      Education   Cardiac Education Topics Pritikin   Licensed conveyancer Nutrition   Nutrition Calorie Density   Instruction Review Code 1- Verbalizes Understanding   Class Start Time 1145   Class Stop Time 1225   Class Time Calculation (min) 40 min    Row Name 11/30/22 1400     Education   Cardiac Education Topics Pritikin   Nurse, children's   Educator Dietitian   Select Nutrition   Nutrition Nutrition Action Plan   Instruction Review Code 1- Verbalizes Understanding   Class Start Time 1145   Class Stop Time 1219   Class Time Calculation (min) 34 min    Row Name 12/02/22 1300     Education   Cardiac Education Topics Pritikin   Designer, multimedia   Weekly Topic Efficiency Cooking - Meals in a Snap   Instruction Review Code 1- Verbalizes Understanding   Class Start Time 1140   Class Stop Time 1220   Class Time Calculation (min) 40 min            Core Videos: Exercise    Move It!  Clinical staff conducted group or individual video education with verbal and written material and guidebook.  Patient learns the recommended Pritikin exercise program. Exercise with the goal of living a long, healthy life. Some of the health benefits of exercise include controlled diabetes, healthier blood pressure levels, improved cholesterol levels, improved heart and lung capacity, improved sleep, and better body composition. Everyone should speak with their doctor before starting or changing an exercise routine.  Biomechanical Limitations Clinical staff conducted group or individual video education with verbal and written material and guidebook.  Patient learns how biomechanical limitations can impact exercise and how we can mitigate and possibly overcome limitations to have an impactful and balanced exercise routine.  Body Composition Clinical staff conducted group or  individual video education with verbal and written material and guidebook.  Patient learns that body composition (ratio of muscle mass to fat mass) is a key component to assessing overall fitness, rather than body weight alone. Increased fat mass, especially visceral belly fat, can put [place] at increased risk for metabolic syndrome, type 2 diabetes, heart disease, and even death. It is recommended to combine diet and exercise (cardiovascular and resistance training) to improve your body composition. Seek guidance from your physician and exercise physiologist before implementing an exercise routine.  Exercise Action Plan Clinical staff conducted group or individual video education with verbal and written material and guidebook.  Patient learns the recommended strategies to achieve and enjoy long-term exercise adherence, including variety, self-motivation, self-efficacy, and positive decision making. Benefits of exercise include fitness, good health, weight management, more energy, better sleep, less stress, and overall well-being.  Medical   Heart Disease Risk Reduction Clinical staff conducted group or individual video education with verbal and written material and guidebook.  Patient learns our heart is our most vital organ as it circulates oxygen, nutrients, white blood cells, and hormones  throughout the entire body, and carries waste away. Data supports a plant-based eating plan like the Pritikin Program for its effectiveness in slowing progression of and reversing heart disease. The video provides a number of recommendations to address heart disease.   Metabolic Syndrome and Belly Fat  Clinical staff conducted group or individual video education with verbal and written material and guidebook.  Patient learns what metabolic syndrome is, how it leads to heart disease, and how one can reverse it and keep it from coming back. You have metabolic syndrome if you have 3 of the following 5 criteria: abdominal  obesity, high blood pressure, high triglycerides, low HDL cholesterol, and high blood sugar.  Hypertension and Heart Disease Clinical staff conducted group or individual video education with verbal and written material and guidebook.  Patient learns that high blood pressure, or hypertension, is very common in the [place]. Hypertension is largely due to excessive salt intake, but other important risk factors include being overweight, physical inactivity, drinking too much alcohol, smoking, and not eating enough potassium from fruits and vegetables. High blood pressure is a leading risk factor for heart attack, stroke, congestive heart failure, dementia, kidney failure, and premature death. Long-term effects of excessive salt intake include stiffening of the arteries and thickening of heart muscle and organ damage. Recommendations include ways to reduce hypertension and the risk of heart disease.  Diseases of Our Time - Focusing on Diabetes Clinical staff conducted group or individual video education with verbal and written material and guidebook.  Patient learns why the best way to stop diseases of our time is prevention, through food and other lifestyle changes. Medicine (such as prescription pills and surgeries) is often only a Band-Aid on the problem, not a long-term solution. Most common diseases of our time include obesity, type 2 diabetes, hypertension, heart disease, and cancer. The Pritikin Program is recommended and has been proven to help reduce, reverse, and/or prevent the damaging effects of metabolic syndrome.  Nutrition   Overview of the Pritikin Eating Plan  Clinical staff conducted group or individual video education with verbal and written material and guidebook.  Patient learns about the Pritikin Eating Plan for disease risk reduction. The Pritikin Eating Plan emphasizes a wide variety of unrefined, minimally-processed carbohydrates, like fruits, vegetables, whole grains, and  legumes. Go, Caution, and Stop food choices are explained. Plant-based and lean animal proteins are emphasized. Rationale provided for low sodium intake for blood pressure control, low added sugars for blood sugar stabilization, and low added fats and oils for coronary artery disease risk reduction and weight management.  Calorie Density  Clinical staff conducted group or individual video education with verbal and written material and guidebook.  Patient learns about calorie density and how it impacts the Pritikin Eating Plan. Knowing the characteristics of the food you choose will help you decide whether those foods will lead to weight gain or weight loss, and whether you want to consume more or less of them. Weight loss is usually a side effect of the Pritikin Eating Plan because of its focus on low calorie-dense foods.  Label Reading  Clinical staff conducted group or individual video education with verbal and written material and guidebook.  Patient learns about the Pritikin recommended label reading guidelines and corresponding recommendations regarding calorie density, added sugars, sodium content, and whole grains.  Dining Out - Part 1  Clinical staff conducted group or individual video education with verbal and written material and guidebook.  Patient learns that restaurant meals can  be sabotaging because they can be so high in calories, fat, sodium, and/or sugar. Patient learns recommended strategies on how to positively address this and avoid unhealthy pitfalls.  Facts on Fats  Clinical staff conducted group or individual video education with verbal and written material and guidebook.  Patient learns that lifestyle modifications can be just as effective, if not more so, as many medications for lowering your risk of heart disease. A Pritikin lifestyle can help to reduce your risk of inflammation and atherosclerosis (cholesterol build-up, or plaque, in the artery walls). Lifestyle  interventions such as dietary choices and physical activity address the cause of atherosclerosis. A review of the types of fats and their impact on blood cholesterol levels, along with dietary recommendations to reduce fat intake is also included.  Nutrition Action Plan  Clinical staff conducted group or individual video education with verbal and written material and guidebook.  Patient learns how to incorporate Pritikin recommendations into their lifestyle. Recommendations include planning and keeping personal health goals in mind as an important part of their success.  Healthy Mind-Set    Healthy Minds, Bodies, Hearts  Clinical staff conducted group or individual video education with verbal and written material and guidebook.  Patient learns how to identify when they are stressed. Video will discuss the impact of that stress, as well as the many benefits of stress management. Patient will also be introduced to stress management techniques. The way we think, act, and feel has an impact on our hearts.  How Our Thoughts Can Heal Our Hearts  Clinical staff conducted group or individual video education with verbal and written material and guidebook.  Patient learns that negative thoughts can cause depression and anxiety. This can result in negative lifestyle behavior and serious health problems. Cognitive behavioral therapy is an effective method to help control our thoughts in order to change and improve our emotional outlook.  Additional Videos:  Exercise    Improving Performance  Clinical staff conducted group or individual video education with verbal and written material and guidebook.  Patient learns to use a non-linear approach by alternating intensity levels and lengths of time spent exercising to help burn more calories and lose more body fat. Cardiovascular exercise helps improve heart health, metabolism, hormonal balance, blood sugar control, and recovery from fatigue. Resistance training  improves strength, endurance, balance, coordination, reaction time, metabolism, and muscle mass. Flexibility exercise improves circulation, posture, and balance. Seek guidance from your physician and exercise physiologist before implementing an exercise routine and learn your capabilities and proper form for all exercise.  Introduction to Yoga  Clinical staff conducted group or individual video education with verbal and written material and guidebook.  Patient learns about yoga, a discipline of the coming together of mind, breath, and body. The benefits of yoga include improved flexibility, improved range of motion, better posture and core strength, increased lung function, weight loss, and positive self-image. Yoga's heart health benefits include lowered blood pressure, healthier heart rate, decreased cholesterol and triglyceride levels, improved immune function, and reduced stress. Seek guidance from your physician and exercise physiologist before implementing an exercise routine and learn your capabilities and proper form for all exercise.  Medical   Aging: Enhancing Your Quality of Life  Clinical staff conducted group or individual video education with verbal and written material and guidebook.  Patient learns key strategies and recommendations to stay in good physical health and enhance quality of life, such as prevention strategies, having an advocate, securing a Health Care Proxy and  [employer] [place], and keeping a list of medications and system for tracking them. It also discusses how to avoid risk for bone loss.  Biology of Weight Control  Clinical staff conducted group or individual video education with verbal and written material and guidebook.  Patient learns that weight gain occurs because we consume more calories than we burn (eating more, moving less). Even if your body weight is normal, you may have higher ratios of fat compared to muscle mass. Too much body fat puts you at increased  risk for cardiovascular disease, heart attack, stroke, type 2 diabetes, and obesity-related cancers. In addition to exercise, following the Pritikin Eating Plan can help reduce your risk.  Decoding Lab Results  Clinical staff conducted group or individual video education with verbal and written material and guidebook.  Patient learns that lab test reflects one measurement whose values change over time and are influenced by many factors, including medication, stress, sleep, exercise, food, hydration, pre-existing medical conditions, and more. It is recommended to use the knowledge from this video to become more involved with your lab results and evaluate your numbers to speak with your doctor.   Diseases of Our Time - Overview  Clinical staff conducted group or individual video education with verbal and written material and guidebook.  Patient learns that according to the CDC, 50% to 70% of chronic diseases (such as obesity, type 2 diabetes, elevated lipids, hypertension, and heart disease) are avoidable through lifestyle improvements including healthier food choices, listening to satiety cues, and increased physical activity.  Sleep Disorders Clinical staff conducted group or individual video education with verbal and written material and guidebook.  Patient learns how good quality and duration of sleep are important to overall health and well-being. Patient also learns about sleep disorders and how they impact health along with recommendations to address them, including discussing with a physician.  Nutrition  Dining Out - Part 2 Clinical staff conducted group or individual video education with verbal and written material and guidebook.  Patient learns how to plan ahead and communicate in order to maximize their dining experience in a healthy and nutritious manner. Included are recommended food choices based on the type of restaurant the patient is visiting.   Fueling a Tax inspector conducted group or individual video education with verbal and written material and guidebook.  There is a strong connection between our food choices and our health. Diseases like obesity and type 2 diabetes are very prevalent and are in large-part due to lifestyle choices. The Pritikin Eating Plan provides plenty of food and hunger-curbing satisfaction. It is easy to follow, affordable, and helps reduce health risks.  Menu Workshop  Clinical staff conducted group or individual video education with verbal and written material and guidebook.  Patient learns that restaurant meals can sabotage health goals because they are often packed with calories, fat, sodium, and sugar. Recommendations include strategies to plan ahead and to communicate with the manager, chef, or server to help order a healthier meal.  Planning Your Eating Strategy  Clinical staff conducted group or individual video education with verbal and written material and guidebook.  Patient learns about the Pritikin Eating Plan and its benefit of reducing the risk of disease. The Pritikin Eating Plan does not focus on calories. Instead, it emphasizes high-quality, nutrient-rich foods. By knowing the characteristics of the foods, we choose, we can determine their calorie density and make informed decisions.  Targeting Your Nutrition Priorities  Clinical staff conducted group  or individual video education with verbal and written material and guidebook.  Patient learns that lifestyle habits have a tremendous impact on disease risk and progression. This video provides eating and physical activity recommendations based on your personal health goals, such as reducing LDL cholesterol, losing weight, preventing or controlling type 2 diabetes, and reducing high blood pressure.  Vitamins and Minerals  Clinical staff conducted group or individual video education with verbal and written material and guidebook.  Patient learns different ways to  obtain key vitamins and minerals, including through a recommended healthy diet. It is important to discuss all supplements you take with your doctor.   Healthy Mind-Set    Smoking Cessation  Clinical staff conducted group or individual video education with verbal and written material and guidebook.  Patient learns that cigarette smoking and tobacco addiction pose a serious health risk which affects millions of people. Stopping smoking will significantly reduce the risk of heart disease, lung disease, and many forms of cancer. Recommended strategies for quitting are covered, including working with your doctor to develop a successful plan.  Culinary   Becoming a Set designer conducted group or individual video education with verbal and written material and guidebook.  Patient learns that cooking at home can be healthy, cost-effective, quick, and puts them in control. Keys to cooking healthy recipes will include looking at your recipe, assessing your equipment needs, planning ahead, making it simple, choosing cost-effective seasonal ingredients, and limiting the use of added fats, salts, and sugars.  Cooking - Breakfast and Snacks  Clinical staff conducted group or individual video education with verbal and written material and guidebook.  Patient learns how important breakfast is to satiety and nutrition through the entire day. Recommendations include key foods to eat during breakfast to help stabilize blood sugar levels and to prevent overeating at meals later in the day. Planning ahead is also a key component.  Cooking - Educational psychologist conducted group or individual video education with verbal and written material and guidebook.  Patient learns eating strategies to improve overall health, including an approach to cook more at home. Recommendations include thinking of animal protein as a side on your plate rather than center stage and focusing instead on lower  calorie dense options like vegetables, fruits, whole grains, and plant-based proteins, such as beans. Making sauces in large quantities to freeze for later and leaving the skin on your vegetables are also recommended to maximize your experience.  Cooking - Healthy Salads and Dressing Clinical staff conducted group or individual video education with verbal and written material and guidebook.  Patient learns that vegetables, fruits, whole grains, and legumes are the foundations of the Pritikin Eating Plan. Recommendations include how to incorporate each of these in flavorful and healthy salads, and how to create homemade salad dressings. Proper handling of ingredients is also covered. Cooking - Soups and [employer] - Soups and Desserts Clinical staff conducted group or individual video education with verbal and written material and guidebook.  Patient learns that Pritikin soups and desserts make for easy, nutritious, and delicious snacks and meal components that are low in sodium, fat, sugar, and calorie density, while high in vitamins, minerals, and filling fiber. Recommendations include simple and healthy ideas for soups and desserts.   Overview     The Pritikin Solution Program Overview Clinical staff conducted group or individual video education with verbal and written material and guidebook.  Patient learns that the results of the  Pritikin Program have been documented in more than 100 articles published in peer-reviewed journals, and the benefits include reducing risk factors for (and, in some cases, even reversing) high cholesterol, high blood pressure, type 2 diabetes, obesity, and more! An overview of the three key pillars of the Pritikin Program will be covered: eating well, doing regular exercise, and having a healthy mind-set.  WORKSHOPS  Exercise: Exercise Basics: Building Your Action Plan Clinical staff led group instruction and group discussion with PowerPoint presentation and  patient guidebook. To enhance the learning environment the use of posters, models and videos may be added. At the conclusion of this workshop, patients will comprehend the difference between physical activity and exercise, as well as the benefits of incorporating both, into their routine. Patients will understand the FITT (Frequency, Intensity, Time, and Type) principle and how to use it to build an exercise action plan. In addition, safety concerns and other considerations for exercise and cardiac rehab will be addressed by the presenter. The purpose of this lesson is to promote a comprehensive and effective weekly exercise routine in order to improve patients' overall level of fitness.   Managing Heart Disease: Your Path to a Healthier Heart Clinical staff led group instruction and group discussion with PowerPoint presentation and patient guidebook. To enhance the learning environment the use of posters, models and videos may be added.At the conclusion of this workshop, patients will understand the anatomy and physiology of the heart. Additionally, they will understand how Pritikin's three pillars impact the risk factors, the progression, and the management of heart disease.  The purpose of this lesson is to provide a high-level overview of the heart, heart disease, and how the Pritikin lifestyle positively impacts risk factors.  Exercise Biomechanics Clinical staff led group instruction and group discussion with PowerPoint presentation and patient guidebook. To enhance the learning environment the use of posters, models and videos may be added. Patients will learn how the structural parts of their bodies function and how these functions impact their daily activities, movement, and exercise. Patients will learn how to promote a neutral spine, learn how to manage pain, and identify ways to improve their physical movement in order to promote healthy living. The purpose of this lesson is to expose  patients to common physical limitations that impact physical activity. Participants will learn practical ways to adapt and manage aches and pains, and to minimize their effect on regular exercise. Patients will learn how to maintain good posture while sitting, walking, and lifting.  Balance Training and Fall Prevention  Clinical staff led group instruction and group discussion with PowerPoint presentation and patient guidebook. To enhance the learning environment the use of posters, models and videos may be added. At the conclusion of this workshop, patients will understand the importance of their sensorimotor skills (vision, proprioception, and the vestibular system) in maintaining their ability to balance as they age. Patients will apply a variety of balancing exercises that are appropriate for their current level of function. Patients will understand the common causes for poor balance, possible solutions to these problems, and ways to modify their physical environment in order to minimize their fall risk. The purpose of this lesson is to teach patients about the importance of maintaining balance as they age and ways to minimize their risk of falling.  WORKSHOPS   Nutrition:  Fueling a Ship brokerHealthy Body Clinical staff led group instruction and group discussion with PowerPoint presentation and patient guidebook. To enhance the learning environment the use of posters, models and  videos may be added. Patients will review the foundational principles of the Pritikin Eating Plan and understand what constitutes a serving size in each of the food groups. Patients will also learn Pritikin-friendly foods that are better choices when away from home and review make-ahead meal and snack options. Calorie density will be reviewed and applied to three nutrition priorities: weight maintenance, weight loss, and weight gain. The purpose of this lesson is to reinforce (in a group setting) the key concepts around what  patients are recommended to eat and how to apply these guidelines when away from home by planning and selecting Pritikin-friendly options. Patients will understand how calorie density may be adjusted for different weight management goals.  Mindful Eating  Clinical staff led group instruction and group discussion with PowerPoint presentation and patient guidebook. To enhance the learning environment the use of posters, models and videos may be added. Patients will briefly review the concepts of the Pritikin Eating Plan and the importance of low-calorie dense foods. The concept of mindful eating will be introduced as well as the importance of paying attention to internal hunger signals. Triggers for non-hunger eating and techniques for dealing with triggers will be explored. The purpose of this lesson is to provide patients with the opportunity to review the basic principles of the Pritikin Eating Plan, discuss the value of eating mindfully and how to measure internal cues of hunger and fullness using the Hunger Scale. Patients will also discuss reasons for non-hunger eating and learn strategies to use for controlling emotional eating.  Targeting Your Nutrition Priorities Clinical staff led group instruction and group discussion with PowerPoint presentation and patient guidebook. To enhance the learning environment the use of posters, models and videos may be added. Patients will learn how to determine their genetic susceptibility to disease by reviewing their family history. Patients will gain insight into the importance of diet as part of an overall healthy lifestyle in mitigating the impact of genetics and other environmental insults. The purpose of this lesson is to provide patients with the opportunity to assess their personal nutrition priorities by looking at their family history, their own health history and current risk factors. Patients will also be able to discuss ways of prioritizing and modifying  the Pritikin Eating Plan for their highest risk areas  Menu  Clinical staff led group instruction and group discussion with PowerPoint presentation and patient guidebook. To enhance the learning environment the use of posters, models and videos may be added. Using menus brought in from [employer], or printed from [employer], patients will apply the Pritikin dining out guidelines that were presented in the [employer] video. Patients will also be able to practice these guidelines in a variety of provided scenarios. The purpose of this lesson is to provide patients with the opportunity to practice hands-on learning of the Pritikin Dining Out guidelines with actual menus and practice scenarios.  Label Reading Clinical staff led group instruction and group discussion with PowerPoint presentation and patient guidebook. To enhance the learning environment the use of posters, models and videos may be added. Patients will review and discuss the Pritikin label reading guidelines presented in Pritikin's Label Reading Educational series video. Using fool labels brought in from local grocery stores and markets, patients will apply the label reading guidelines and determine if the packaged food meet the Pritikin guidelines. The purpose of this lesson is to provide patients with the opportunity to review, discuss, and practice hands-on learning of the Pritikin Label Reading  guidelines with actual packaged food labels. Cooking School  Pritikin's [employer] are designed to teach patients ways to prepare quick, simple, and affordable recipes at home. The importance of nutrition's role in chronic disease risk reduction is reflected in its emphasis in the overall Pritikin program. By learning how to prepare essential core Pritikin Eating Plan recipes, patients will increase control over what they eat; be able to customize the flavor of foods without the use of added salt, sugar, or  fat; and improve the quality of the food they consume. By learning a set of core recipes which are easily assembled, quickly prepared, and affordable, patients are more likely to prepare more healthy foods at home. These workshops focus on convenient breakfasts, simple entres, side dishes, and desserts which can be prepared with minimal effort and are consistent with nutrition recommendations for cardiovascular risk reduction. Cooking [employer] are taught by a Armed forces logistics/support/administrative officer (RD) who has been trained by the [employer]. The chef or RD has a clear understanding of the importance of minimizing - if not completely eliminating - added fat, sugar, and sodium in recipes. Throughout the series of Cooking School Workshop sessions, patients will learn about healthy ingredients and efficient methods of cooking to build confidence in their capability to prepare    Cooking School weekly topics:  Adding Flavor- Sodium-Free  Fast and Healthy Breakfasts  Powerhouse Plant-Based Proteins  Satisfying Salads and Dressings  Simple Sides and Sauces  International Cuisine-Spotlight on the [employer] Zones  Delicious Desserts  Savory Soups  [employer] - Meals in a Astronomer Appetizers and Snacks  Comforting Weekend Breakfasts  One-Pot Wonders   Fast Evening Meals  Landscape architect Your Pritikin Plate  WORKSHOPS   Healthy Mindset (Psychosocial):  Focused Goals, Sustainable Changes Clinical staff led group instruction and group discussion with PowerPoint presentation and patient guidebook. To enhance the learning environment the use of posters, models and videos may be added. Patients will be able to apply effective goal setting strategies to establish at least one personal goal, and then take consistent, meaningful action toward that goal. They will learn to identify common barriers to achieving personal goals and develop strategies to overcome them. Patients  will also gain an understanding of how our mind-set can impact our ability to achieve goals and the importance of cultivating a positive and growth-oriented mind-set. The purpose of this lesson is to provide patients with a deeper understanding of how to set and achieve personal goals, as well as the tools and strategies needed to overcome common obstacles which may arise along the way.  From Head to Heart: The Power of a Healthy Outlook  Clinical staff led group instruction and group discussion with PowerPoint presentation and patient guidebook. To enhance the learning environment the use of posters, models and videos may be added. Patients will be able to recognize and describe the impact of emotions and mood on physical health. They will discover the importance of self-care and explore self-care practices which may work for them. Patients will also learn how to utilize the 4 C's to cultivate a healthier outlook and better manage stress and challenges. The purpose of this lesson is to demonstrate to patients how a healthy outlook is an essential part of maintaining good health, especially as they continue their cardiac rehab journey.  Healthy Sleep for a Healthy Heart Clinical staff led group instruction and group discussion with PowerPoint presentation and patient guidebook. To enhance the  learning environment the use of posters, models and videos may be added. At the conclusion of this workshop, patients will be able to demonstrate knowledge of the importance of sleep to overall health, well-being, and quality of life. They will understand the symptoms of, and treatments for, common sleep disorders. Patients will also be able to identify daytime and nighttime behaviors which impact sleep, and they will be able to apply these tools to help manage sleep-related challenges. The purpose of this lesson is to provide patients with a general overview of sleep and outline the importance of quality sleep. Patients  will learn about a few of the most common sleep disorders. Patients will also be introduced to the concept of "sleep hygiene," and discover ways to self-manage certain sleeping problems through simple daily behavior changes. Finally, the workshop will motivate patients by clarifying the links between quality sleep and their goals of heart-healthy living.   Recognizing and Reducing Stress Clinical staff led group instruction and group discussion with PowerPoint presentation and patient guidebook. To enhance the learning environment the use of posters, models and videos may be added. At the conclusion of this workshop, patients will be able to understand the types of stress reactions, differentiate between acute and chronic stress, and recognize the impact that chronic stress has on their health. They will also be able to apply different coping mechanisms, such as reframing negative self-talk. Patients will have the opportunity to practice a variety of stress management techniques, such as deep abdominal breathing, progressive muscle relaxation, and/or guided imagery.  The purpose of this lesson is to educate patients on the role of stress in their lives and to provide healthy techniques for coping with it.  Learning Barriers/Preferences:  Learning Barriers/Preferences - 11/11/22 1352       Learning Barriers/Preferences   Learning Barriers Sight   pt wears glasses   Learning Preferences Computer/Internet;Group Instruction;Individual Instruction;Pictoral;Skilled Demonstration;Verbal Instruction;Video;Written Material             Education Topics:  Knowledge Questionnaire Score:  Knowledge Questionnaire Score - 11/11/22 1353       Knowledge Questionnaire Score   Pre Score 19/24             Core Components/Risk Factors/Patient Goals at Admission:  Personal Goals and Risk Factors at Admission - 11/11/22 1354       Core Components/Risk Factors/Patient Goals on Admission    Weight  Management Yes;Obesity;Weight Loss    Intervention Weight Management: Provide education and appropriate resources to help participant work on and attain dietary goals.;Weight Management: Develop a combined nutrition and exercise program designed to reach desired caloric intake, while maintaining appropriate intake of nutrient and fiber, sodium and fats, and appropriate energy expenditure required for the weight goal.;Weight Management/Obesity: Establish reasonable short term and long term weight goals.;Obesity: Provide education and appropriate resources to help participant work on and attain dietary goals.    Admit Weight 225 lb 1.4 oz (102.1 kg)    Goal Weight: Short Term 215 lb (97.5 kg)    Goal Weight: Long Term 205 lb (93 kg)    Expected Outcomes Short Term: Continue to assess and modify interventions until short term weight is achieved;Long Term: Adherence to nutrition and physical activity/exercise program aimed toward attainment of established weight goal;Understanding recommendations for meals to include 15-35% energy as protein, 25-35% energy from fat, 35-60% energy from carbohydrates, less than 200mg  of dietary cholesterol, 20-35 gm of total fiber daily;Understanding of distribution of calorie intake throughout the day with the  consumption of 4-5 meals/snacks;Weight Loss: Understanding of general recommendations for a balanced deficit meal plan, which promotes 1-2 lb weight loss per week and includes a negative energy balance of [phone] kcal/d    Hypertension Yes    Intervention Provide education on lifestyle modifcations including regular physical activity/exercise, weight management, moderate sodium restriction and increased consumption of fresh fruit, vegetables, and low fat dairy, alcohol moderation, and smoking cessation.;Monitor prescription use compliance.    Expected Outcomes Short Term: Continued assessment and intervention until BP is < 140/68mm HG in hypertensive participants. <  130/95mm HG in hypertensive participants with diabetes, heart failure or chronic kidney disease.;Long Term: Maintenance of blood pressure at goal levels.    Lipids Yes    Intervention Provide education and support for participant on nutrition & aerobic/resistive exercise along with prescribed medications to achieve LDL 70mg , HDL >40mg .    Expected Outcomes Short Term: Participant states understanding of desired cholesterol values and is compliant with medications prescribed. Participant is following exercise prescription and nutrition guidelines.;Long Term: Cholesterol controlled with medications as prescribed, with individualized exercise RX and with personalized nutrition plan. Value goals: LDL < 70mg , HDL > 40 mg.             Core Components/Risk Factors/Patient Goals Review:   Goals and Risk Factor Review     Row Name 11/20/22 0953 12/03/22 1639           Core Components/Risk Factors/Patient Goals Review   Personal Goals Review Weight Management/Obesity;Hypertension;Lipids Weight Management/Obesity;Hypertension;Lipids      Review Bryan Hays started intensive cardiac rehab on 11/18/22 and did well with exercise, Vital signs were stable Bryan Hays is doing well with exercise at  intensive cardiac rehab, Vital signs have been stable      Expected Outcomes Bryan Hays will continue to participate in intensive cardiac rehab for exercise, nutritiohn and lifestyle modifications Bryan Hays will continue to participate in intensive cardiac rehab for exercise, nutritiohn and lifestyle modifications               Core Components/Risk Factors/Patient Goals at Discharge (Final Review):   Goals and Risk Factor Review - 12/03/22 1639       Core Components/Risk Factors/Patient Goals Review   Personal Goals Review Weight Management/Obesity;Hypertension;Lipids    Review Bryan Hays is doing well with exercise at  intensive cardiac rehab, Vital signs have been stable    Expected Outcomes Bryan Hays will continue to participate in  intensive cardiac rehab for exercise, nutritiohn and lifestyle modifications             ITP Comments:  ITP Comments     Row Name 11/11/22 1014 11/20/22 0949 12/03/22 1637       ITP Comments Dr. Armanda Magic medical director. Introduction to pritikin education/ intensive cardiac rehab. Initial orientation packet reviewed with patient. 30 Day ITP Review. Bryan Hays started intensive cardiac rehab on 11/18/22 and did well with exercise 30 Day ITP Review. Bryan Hays has good attendance and participation in  intensive cardiac rehab              Comments: See ITP comments.

## 2022-12-04 ENCOUNTER — Encounter (HOSPITAL_COMMUNITY)
Admission: RE | Admit: 2022-12-04 | Discharge: 2022-12-04 | Disposition: A | Discharge status: Home / Self Care | Payer: Medicare HMO | Source: Ambulatory Visit | Attending: Cardiology | Admitting: Cardiology

## 2022-12-04 DIAGNOSIS — Z955 Presence of coronary angioplasty implant and graft: Secondary | ICD-10-CM

## 2022-12-04 DIAGNOSIS — Z48812 Encounter for surgical aftercare following surgery on the circulatory system: Secondary | ICD-10-CM | POA: Diagnosis not present

## 2022-12-07 ENCOUNTER — Encounter (HOSPITAL_COMMUNITY)
Admission: RE | Admit: 2022-12-07 | Discharge: 2022-12-07 | Disposition: A | Discharge status: Home / Self Care | Payer: Medicare HMO | Source: Ambulatory Visit | Attending: Cardiology | Admitting: Cardiology

## 2022-12-07 DIAGNOSIS — Z955 Presence of coronary angioplasty implant and graft: Secondary | ICD-10-CM | POA: Diagnosis not present

## 2022-12-07 DIAGNOSIS — Z48812 Encounter for surgical aftercare following surgery on the circulatory system: Secondary | ICD-10-CM | POA: Diagnosis not present

## 2022-12-09 ENCOUNTER — Encounter (HOSPITAL_COMMUNITY)
Admission: RE | Admit: 2022-12-09 | Discharge: 2022-12-09 | Disposition: A | Discharge status: Home / Self Care | Payer: Medicare HMO | Source: Ambulatory Visit | Attending: Cardiology | Admitting: Cardiology

## 2022-12-09 DIAGNOSIS — Z955 Presence of coronary angioplasty implant and graft: Secondary | ICD-10-CM

## 2022-12-09 DIAGNOSIS — Z48812 Encounter for surgical aftercare following surgery on the circulatory system: Secondary | ICD-10-CM | POA: Diagnosis not present

## 2022-12-11 ENCOUNTER — Encounter (HOSPITAL_COMMUNITY)
Admission: RE | Admit: 2022-12-11 | Discharge: 2022-12-11 | Disposition: A | Discharge status: Home / Self Care | Payer: Medicare HMO | Source: Ambulatory Visit | Attending: Cardiology | Admitting: Cardiology

## 2022-12-11 DIAGNOSIS — Z955 Presence of coronary angioplasty implant and graft: Secondary | ICD-10-CM | POA: Diagnosis not present

## 2022-12-11 DIAGNOSIS — Z48812 Encounter for surgical aftercare following surgery on the circulatory system: Secondary | ICD-10-CM | POA: Diagnosis not present

## 2022-12-14 ENCOUNTER — Encounter (HOSPITAL_COMMUNITY)
Admission: RE | Admit: 2022-12-14 | Discharge: 2022-12-14 | Disposition: A | Discharge status: Home / Self Care | Payer: Medicare HMO | Source: Ambulatory Visit | Attending: Cardiology | Admitting: Cardiology

## 2022-12-14 DIAGNOSIS — Z48812 Encounter for surgical aftercare following surgery on the circulatory system: Secondary | ICD-10-CM | POA: Diagnosis not present

## 2022-12-14 DIAGNOSIS — Z955 Presence of coronary angioplasty implant and graft: Secondary | ICD-10-CM

## 2022-12-16 ENCOUNTER — Encounter (HOSPITAL_COMMUNITY)
Admission: RE | Admit: 2022-12-16 | Discharge: 2022-12-16 | Disposition: A | Discharge status: Home / Self Care | Payer: Medicare HMO | Source: Ambulatory Visit | Attending: Cardiology | Admitting: Cardiology

## 2022-12-16 DIAGNOSIS — Z955 Presence of coronary angioplasty implant and graft: Secondary | ICD-10-CM

## 2022-12-16 DIAGNOSIS — Z48812 Encounter for surgical aftercare following surgery on the circulatory system: Secondary | ICD-10-CM | POA: Diagnosis not present

## 2022-12-18 ENCOUNTER — Encounter (HOSPITAL_COMMUNITY)
Admission: RE | Admit: 2022-12-18 | Discharge: 2022-12-18 | Disposition: A | Discharge status: Home / Self Care | Payer: Medicare HMO | Source: Ambulatory Visit | Attending: Cardiology | Admitting: Cardiology

## 2022-12-18 DIAGNOSIS — Z955 Presence of coronary angioplasty implant and graft: Secondary | ICD-10-CM

## 2022-12-18 DIAGNOSIS — Z48812 Encounter for surgical aftercare following surgery on the circulatory system: Secondary | ICD-10-CM | POA: Diagnosis not present

## 2022-12-21 ENCOUNTER — Encounter (HOSPITAL_COMMUNITY)
Admission: RE | Admit: 2022-12-21 | Discharge: 2022-12-21 | Disposition: A | Discharge status: Home / Self Care | Payer: Medicare HMO | Source: Ambulatory Visit | Attending: Cardiology | Admitting: Cardiology

## 2022-12-21 DIAGNOSIS — Z48812 Encounter for surgical aftercare following surgery on the circulatory system: Secondary | ICD-10-CM | POA: Diagnosis not present

## 2022-12-21 DIAGNOSIS — Z955 Presence of coronary angioplasty implant and graft: Secondary | ICD-10-CM

## 2022-12-22 NOTE — Progress Notes (Signed)
Reviewed home exercise Rx with patient today.  Encouraged warm-up, cool-down, and stretching. Reviewed THRR of 57 - 115 and keeping RPE between 11-13. Encouraged to hydrate with activity.  Reviewed weather parameters for temperature and humidity for safe exercise outdoors. Reviewed S/S to terminate exercise and when to call 911 vs MD. Reviewed the use of NTG and pt was encouraged to carry at all times. Pt encouraged to always carry a cell phone for safety when exercising outdoors. Pt verbalized understanding of the home exercise Rx and was provided a copy.   Lorin Picket MS, ACSM-CEP, CCRP

## 2022-12-23 ENCOUNTER — Encounter (HOSPITAL_COMMUNITY): Payer: Medicare HMO

## 2022-12-25 ENCOUNTER — Encounter (HOSPITAL_COMMUNITY): Payer: Medicare HMO

## 2022-12-28 ENCOUNTER — Encounter (HOSPITAL_COMMUNITY)
Admission: RE | Admit: 2022-12-28 | Discharge: 2022-12-28 | Disposition: A | Discharge status: Home / Self Care | Payer: Medicare HMO | Source: Ambulatory Visit | Attending: Cardiology | Admitting: Cardiology

## 2022-12-28 DIAGNOSIS — Z955 Presence of coronary angioplasty implant and graft: Secondary | ICD-10-CM | POA: Diagnosis not present

## 2022-12-29 NOTE — Progress Notes (Signed)
Cardiac Individual Treatment Plan  Patient Details  Name: Bryan Hays MRN: [id number] Date of Birth: March 30, 1946 Referring Provider:   Flowsheet Row INTENSIVE CARDIAC REHAB ORIENT from 11/11/2022 in [facility]  Referring Provider Olga Millers, MD       Initial Encounter Date:  Flowsheet Row INTENSIVE CARDIAC REHAB ORIENT from 11/11/2022 in [facility]  Date 11/11/22       Visit Diagnosis: S/P drug eluting coronary stent placement  Patient's Home Medications on Admission:  Current Outpatient Medications:    Evolocumab (REPATHA SURECLICK) 140 MG/ML SOAJ, Inject 140 mg into the skin every 14 (fourteen) days., Disp: 6 mL, Rfl: 3   acetaminophen (TYLENOL) 650 MG CR tablet, Take 650 mg by mouth every 8 (eight) hours as needed for pain., Disp: , Rfl:    aspirin 81 MG EC tablet, Take 81 mg by mouth daily.  , Disp: , Rfl:    cholecalciferol (VITAMIN D3) 25 MCG (1000 UNIT) tablet, Take 1,000 Units by mouth daily., Disp: , Rfl:    clopidogrel (PLAVIX) 75 MG tablet, Take 1 tablet (75 mg total) by mouth daily with breakfast., Disp: 90 tablet, Rfl: 3   cyanocobalamin (VITAMIN B12) 1000 MCG tablet, Take 1,000 mcg by mouth daily., Disp: , Rfl:    gabapentin (NEURONTIN) 300 MG capsule, Take 300 mg by mouth at bedtime., Disp: , Rfl:    lisinopril (PRINIVIL,ZESTRIL) 5 MG tablet, Take 5 mg by mouth daily., Disp: , Rfl: 0   metoprolol succinate (TOPROL XL) 25 MG 24 hr tablet, Take 1 tablet (25 mg total) by mouth daily., Disp: 90 tablet, Rfl: 3   neomycin-polymyxin-dexamethasone (MAXITROL) 0.1 % ophthalmic suspension, Place 1 drop into both eyes daily as needed (irritation)., Disp: , Rfl:    nitroGLYCERIN (NITROSTAT) 0.4 MG SL tablet, Place 1 tablet (0.4 mg total) under the tongue every 5 (five) minutes as needed for chest pain., Disp: 25 tablet, Rfl: 3   pantoprazole (PROTONIX) 40 MG  tablet, Take 1 tablet (40 mg total) by mouth daily., Disp: 90 tablet, Rfl: 3   Phenylephrine-APAP-guaiFENesin (MUCINEX FAST-MAX) 10-650-400 MG/20ML LIQD, Take 20 mLs by mouth daily as needed (congestion)., Disp: , Rfl:    rosuvastatin (CRESTOR) 40 MG tablet, Take 1 tablet (40 mg total) by mouth daily. TAKE 1 TABLET BY MOUTH EVERY DAY, Disp: 90 tablet, Rfl: 3  Past Medical History: Past Medical History:  Diagnosis Date   Allergic rhinitis due to pollen    CAD (coronary artery disease)    Degeneration of lumbar or lumbosacral intervertebral disc    GERD (gastroesophageal reflux disease)    Hyperlipidemia    Hypertension    Nephrolithiasis    Personal history of urinary calculi    Tendinitis     Tobacco Use: Social History   Tobacco Use  Smoking Status Never  Smokeless Tobacco Never    Labs: Review Flowsheet  More data exists      Latest Ref Rng & Units 02/11/2009 02/11/2010 02/13/2011 03/09/2012 03/16/2013  Labs for ITP Cardiac and Pulmonary Rehab  Cholestrol 0 - 200 mg/dL 578  469  629  528  413   LDL (calc) 0 - 99 mg/dL 244  91  010  272  536   HDL-C >39.00 mg/dL 64.40  34.74  25.95  63.87  47.40   Trlycerides 0.0 - 149.0 mg/dL 56.4  332.9  518.8  41.6  85.0  Capillary Blood Glucose: No results found for: "GLUCAP"   Exercise Target Goals: Exercise Program Goal: Individual exercise prescription set using results from initial 6 min walk test and THRR while considering  patient's activity barriers and safety.   Exercise Prescription Goal: Initial exercise prescription builds to 30-45 minutes a day of aerobic activity, 2-3 days per week.  Home exercise guidelines will be given to patient during program as part of exercise prescription that the participant will acknowledge.  Activity Barriers & Risk Stratification:  Activity Barriers & Cardiac Risk Stratification - 11/11/22 1344       Activity Barriers & Cardiac Risk Stratification   Activity Barriers Shortness of  Breath;Joint Problems;Back Problems    Cardiac Risk Stratification High   < 5 METs on            6 Minute Walk:  6 Minute Walk     Row Name 11/11/22 1343         6 Minute Walk   Phase Initial     Distance 1724 feet     Walk Time 6 minutes     # of Rest Breaks 0     MPH 3.27     METS 2.89     RPE 12     Perceived Dyspnea  1     VO2 Peak 10.14     Symptoms Yes (comment)     Comments mild SOB, resolved with rest. no pain     Resting HR 62 bpm     Resting BP 106/62     Resting Oxygen Saturation  96 %     Exercise Oxygen Saturation  during 6 min walk 98 %     Max Ex. HR 108 bpm     Max Ex. BP 130/62     2 Minute Post BP 104/66              Oxygen Initial Assessment:   Oxygen Re-Evaluation:   Oxygen Discharge (Final Oxygen Re-Evaluation):   Initial Exercise Prescription:  Initial Exercise Prescription - 11/11/22 1300       Date of Initial Exercise RX and Referring Provider   Date 11/11/22    Referring Provider Olga Millers, MD    Expected Discharge Date 01/22/23      Bike   Level 1.5    Watts 20    Minutes 15    METs 2.5      Recumbant Elliptical   Level 2    RPM 50    Watts 80    Minutes 15    METs 2.5      Prescription Details   Frequency (times per week) 3    Duration Progress to 30 minutes of continuous aerobic without signs/symptoms of physical distress      Intensity   THRR 40-80% of Max Heartrate 57-115    Ratings of Perceived Exertion 11-13    Perceived Dyspnea 0-4      Progression   Progression Continue progressive overload as per policy without signs/symptoms or physical distress.      Resistance Training   Training Prescription Yes    Weight 3    Reps 10-15             Perform Capillary Blood Glucose checks as needed.  Exercise Prescription Changes:   Exercise Prescription Changes     Row Name 11/18/22 1400 11/27/22 1600 12/09/22 1500 12/21/22 1439       Response to Exercise   Blood Pressure (Admit)  134/68  122/66 108/60 106/70    Blood Pressure (Exercise) 142/68 158/72 130/80 132/64    Blood Pressure (Exit) 122/56 112/80 106/60 124/70    Heart Rate (Admit) 69 bpm 67 bpm 69 bpm 62 bpm    Heart Rate (Exercise) 95 bpm 102 bpm 100 bpm 92 bpm    Heart Rate (Exit) 78 bpm 76 bpm 78 bpm 69 bpm    Rating of Perceived Exertion (Exercise) 12 12 13 12     Symptoms none None None none    Comments --  pt first day Reviewed METs Reviewed METs and goals Reviewed home exercise Rx    Duration Continue with 30 min of aerobic exercise without signs/symptoms of physical distress. Continue with 30 min of aerobic exercise without signs/symptoms of physical distress. Continue with 30 min of aerobic exercise without signs/symptoms of physical distress. Continue with 30 min of aerobic exercise without signs/symptoms of physical distress.    Intensity THRR unchanged THRR unchanged THRR unchanged THRR unchanged      Progression   Progression Continue to progress workloads to maintain intensity without signs/symptoms of physical distress. Continue to progress workloads to maintain intensity without signs/symptoms of physical distress. Continue to progress workloads to maintain intensity without signs/symptoms of physical distress. Continue to progress workloads to maintain intensity without signs/symptoms of physical distress.    Average METs 2.9 4 3.4 3.2      Resistance Training   Training Prescription --  no weights on Wed Yes No Yes    Weight -- 3 No weights on Wednesddays 4 lbs    Reps -- 10-15 -- 10-15    Time -- 10 Minutes -- 10 Minutes      Interval Training   Interval Training -- No No No      Bike   Level 1.5 2 2 2     Watts -- -- 36 30    Minutes 15 15 15 15     METs 2.6 2.9 3 2.9      Recumbant Elliptical   Level 2 2 2 2     RPM -- 74 69 66    Watts -- 116 98 30    Minutes 15 15 15 15     METs 3.2 5.1 3.8 2.9      Home Exercise Plan   Plans to continue exercise at -- -- -- Home (comment)     Frequency -- -- -- Add 4 additional days to program exercise sessions.    Initial Home Exercises Provided -- -- -- 12/21/22             Exercise Comments:   Exercise Comments     Row Name 11/18/22 1429 11/27/22 1407 12/21/22 1442       Exercise Comments Pt did great on his first day, will contune to moniotr and increase WL as tolerated. Reviewed METs. Pt is making progress in the CRP2 program. Reviewed home exercise Rx. Pt is currently walking at home 4x/week for 30 minutes. Pt will continue this. Pt verbalized understanding of the home exercise Rx and was provided a copy.              Exercise Goals and Review:   Exercise Goals     Row Name 11/11/22 1350             Exercise Goals   Increase Physical Activity Yes       Intervention Provide advice, education, support and counseling about physical activity/exercise needs.;Develop an individualized exercise prescription for aerobic and resistive training based  on initial evaluation findings, risk stratification, comorbidities and participant's personal goals.       Expected Outcomes Short Term: Attend rehab on a regular basis to increase amount of physical activity.;Long Term: Exercising regularly at least 3-5 days a week.;Long Term: Add in home exercise to make exercise part of routine and to increase amount of physical activity.       Increase Strength and Stamina Yes       Intervention Provide advice, education, support and counseling about physical activity/exercise needs.;Develop an individualized exercise prescription for aerobic and resistive training based on initial evaluation findings, risk stratification, comorbidities and participant's personal goals.       Expected Outcomes Short Term: Perform resistance training exercises routinely during rehab and add in resistance training at home;Short Term: Increase workloads from initial exercise prescription for resistance, speed, and METs.;Long Term: Improve  cardiorespiratory fitness, muscular endurance and strength as measured by increased METs and functional capacity ( )       Able to understand and use rate of perceived exertion (RPE) scale Yes       Intervention Provide education and explanation on how to use RPE scale       Expected Outcomes Short Term: Able to use RPE daily in rehab to express subjective intensity level;Long Term:  Able to use RPE to guide intensity level when exercising independently       Able to understand and use Dyspnea scale Yes       Intervention Provide education and explanation on how to use Dyspnea scale       Expected Outcomes Short Term: Able to use Dyspnea scale daily in rehab to express subjective sense of shortness of breath during exertion;Long Term: Able to use Dyspnea scale to guide intensity level when exercising independently       Knowledge and understanding of Target Heart Rate Range (THRR) Yes       Intervention Provide education and explanation of THRR including how the numbers were predicted and where they are located for reference       Expected Outcomes Long Term: Able to use THRR to govern intensity when exercising independently;Short Term: Able to state/look up THRR;Short Term: Able to use daily as guideline for intensity in rehab       Understanding of Exercise Prescription Yes       Intervention Provide education, explanation, and written materials on patient's individual exercise prescription       Expected Outcomes Short Term: Able to explain program exercise prescription;Long Term: Able to explain home exercise prescription to exercise independently                Exercise Goals Re-Evaluation :  Exercise Goals Re-Evaluation     Row Name 11/18/22 1419 12/09/22 1500           Exercise Goal Re-Evaluation   Exercise Goals Review Increase Physical Activity;Increase Strength and Stamina;Able to understand and use rate of perceived exertion (RPE) scale;Knowledge and understanding of Target  Heart Rate Range (THRR);Understanding of Exercise Prescription Increase Physical Activity;Increase Strength and Stamina;Able to understand and use rate of perceived exertion (RPE) scale;Knowledge and understanding of Target Heart Rate Range (THRR);Understanding of Exercise Prescription      Comments Pt first day in program, pt exercised for 30 minutes averging 2.9 Mets w/o abnormal s/s. Pt was educated on RPE scale and will continue to educate pt on his ExRx, THRR, and RPE scale while increasing his WL as tolerated. Reviewed METs and goals. Pt making slow progress.  Pt voices he has seen some increase in his strength and stamina. He alos states that he sees that his blood pressure has improved.      Expected Outcomes Pt is very motivated to reach his goals, will continue to educate pt on RPE, THRR, ExRx, and importance of exercise. Will continue to monitor patient and progress exercise workloads as tolerated.               Discharge Exercise Prescription (Final Exercise Prescription Changes):  Exercise Prescription Changes - 12/21/22 1439       Response to Exercise   Blood Pressure (Admit) 106/70    Blood Pressure (Exercise) 132/64    Blood Pressure (Exit) 124/70    Heart Rate (Admit) 62 bpm    Heart Rate (Exercise) 92 bpm    Heart Rate (Exit) 69 bpm    Rating of Perceived Exertion (Exercise) 12    Symptoms none    Comments Reviewed home exercise Rx    Duration Continue with 30 min of aerobic exercise without signs/symptoms of physical distress.    Intensity THRR unchanged      Progression   Progression Continue to progress workloads to maintain intensity without signs/symptoms of physical distress.    Average METs 3.2      Resistance Training   Training Prescription Yes    Weight 4 lbs    Reps 10-15    Time 10 Minutes      Interval Training   Interval Training No      Bike   Level 2    Watts 30    Minutes 15    METs 2.9      Recumbant Elliptical   Level 2    RPM 66     Watts 30    Minutes 15    METs 2.9      Home Exercise Plan   Plans to continue exercise at Home (comment)    Frequency Add 4 additional days to program exercise sessions.    Initial Home Exercises Provided 12/21/22             Nutrition:  Target Goals: Understanding of nutrition guidelines, daily intake of sodium 1500mg , cholesterol 200mg , calories 30% from fat and 7% or less from saturated fats, daily to have 5 or more servings of fruits and vegetables.  Biometrics:  Pre Biometrics - 11/11/22 1340       Pre Biometrics   Waist Circumference 49 inches    Hip Circumference 47.5 inches    Waist to Hip Ratio 1.03 %    Triceps Skinfold 35 mm    % Body Fat 38.9 %    Grip Strength 28 kg    Flexibility 0 in   could not reach   Single Leg Stand 5.12 seconds              Nutrition Therapy Plan and Nutrition Goals:  Nutrition Therapy & Goals - 12/18/22 1431       Nutrition Therapy   Diet Heart Healthy/Carbohydrate Consistent Diet    Drug/Food Interactions Statins/Certain Fruits      Personal Nutrition Goals   Nutrition Goal Patient to identify strategies for reducing cardiovascular risk by attending the [employer] education and nutrition series weekly.    Personal Goal #2 Patient to improve diet quality by using the plate method as a guide for meal planning to include lean protein/plant protein, fruits, vegetables, whole grains, nonfat dairy as part of a well-balanced diet.    Personal Goal #3  Patient to limit to 1500mg  of sodium per day    Personal Goal #4 Patient to identify food sources of saturated fat, trans fat, sodium, and refined carbohydrates.    Comments Goals in action. Bryan Hays continues to attend the [employer] and nutrition series. He and his wife have started making many dietary changes. He was recently diagnosed with DM2 at PCP appointment on 11/05/2022; no diabetes medications were added. He is down 3.5# since starting with our program. Bryan Hays will benefit  from participation in intensive cardiac rehab for nutrition, exercise, and lifestyle modification.      Intervention Plan   Intervention Prescribe, educate and counsel regarding individualized specific dietary modifications aiming towards targeted core components such as weight, hypertension, lipid management, diabetes, heart failure and other comorbidities.;Nutrition handout(s) given to patient.    Expected Outcomes Short Term Goal: Understand basic principles of dietary content, such as calories, fat, sodium, cholesterol and nutrients.;Long Term Goal: Adherence to prescribed nutrition plan.             Nutrition Assessments:  MEDIFICTS Score Key: ?70 Need to make dietary changes  40-70 Heart Healthy Diet ? 40 Therapeutic Level Cholesterol Diet    Picture Your Plate Scores: <16 Unhealthy dietary pattern with much room for improvement. 41-50 Dietary pattern unlikely to meet recommendations for good health and room for improvement. 51-60 More healthful dietary pattern, with some room for improvement.  >60 Healthy dietary pattern, although there may be some specific behaviors that could be improved.    Nutrition Goals Re-Evaluation:  Nutrition Goals Re-Evaluation     Row Name 11/19/22 0821 12/18/22 1431           Goals   Current Weight 225 lb 12 oz (102.4 kg) 221 lb 9 oz (100.5 kg)      Comment Lipids WNL, A1c 6.5 No new labs; most recent labs Lipids WNL, A1c 6.5      Expected Outcome Bryan Hays reports that he does no cooking, that his wife does the grocery shopping and cooking at home. He was recently diagnosed with DM2 at PCP appointment on 11/05/2022; no diabetes medications were added. Bryan Hays will benefit from participation in intensive cardiac rehab for nutrition, exercise, and lifestyle modification Goals in action. Bryan Hays continues to attend the [employer] and nutrition series. He and his wife have started making many dietary changes. He was recently diagnosed with DM2 at PCP  appointment on 11/05/2022; no diabetes medications were added. He is down 3.5# since starting with our program. Bryan Hays will benefit from participation in intensive cardiac rehab for nutrition, exercise, and lifestyle modification.               Nutrition Goals Re-Evaluation:  Nutrition Goals Re-Evaluation     Row Name 11/19/22 0821 12/18/22 1431           Goals   Current Weight 225 lb 12 oz (102.4 kg) 221 lb 9 oz (100.5 kg)      Comment Lipids WNL, A1c 6.5 No new labs; most recent labs Lipids WNL, A1c 6.5      Expected Outcome Bryan Hays reports that he does no cooking, that his wife does the grocery shopping and cooking at home. He was recently diagnosed with DM2 at PCP appointment on 11/05/2022; no diabetes medications were added. Bryan Hays will benefit from participation in intensive cardiac rehab for nutrition, exercise, and lifestyle modification Goals in action. Bryan Hays continues to attend the [employer] and nutrition series. He and his wife have started making many dietary  changes. He was recently diagnosed with DM2 at PCP appointment on 11/05/2022; no diabetes medications were added. He is down 3.5# since starting with our program. Bryan Hays will benefit from participation in intensive cardiac rehab for nutrition, exercise, and lifestyle modification.               Nutrition Goals Discharge (Final Nutrition Goals Re-Evaluation):  Nutrition Goals Re-Evaluation - 12/18/22 1431       Goals   Current Weight 221 lb 9 oz (100.5 kg)    Comment No new labs; most recent labs Lipids WNL, A1c 6.5    Expected Outcome Goals in action. Bryan Hays continues to attend the [employer] and nutrition series. He and his wife have started making many dietary changes. He was recently diagnosed with DM2 at PCP appointment on 11/05/2022; no diabetes medications were added. He is down 3.5# since starting with our program. Bryan Hays will benefit from participation in intensive cardiac rehab for nutrition, exercise, and  lifestyle modification.             Psychosocial: Target Goals: Acknowledge presence or absence of significant depression and/or stress, maximize coping skills, provide positive support system. Participant is able to verbalize types and ability to use techniques and skills needed for reducing stress and depression.  Initial Review & Psychosocial Screening:  Initial Psych Review & Screening - 11/11/22 1351       Initial Review   Current issues with None Identified      Family Dynamics   Good Support System? Yes   Bryan Hays has his wife for support     Barriers   Psychosocial barriers to participate in program There are no identifiable barriers or psychosocial needs.      Screening Interventions   Interventions Encouraged to exercise;Provide feedback about the scores to participant    Expected Outcomes Long Term goal: The participant improves quality of Life and PHQ9 Scores as seen by post scores and/or verbalization of changes;Short Term goal: Identification and review with participant of any Quality of Life or Depression concerns found by scoring the questionnaire.             Quality of Life Scores:  Quality of Life - 11/11/22 1352       Quality of Life   Select Quality of Life      Quality of Life Scores   Health/Function Pre 28.63 %    Socioeconomic Pre 27.58 %    Psych/Spiritual Pre 30 %    Family Pre 30 %    GLOBAL Pre 28.94 %            Scores of 19 and below usually indicate a poorer quality of life in these areas.  A difference of  2-3 points is a clinically meaningful difference.  A difference of 2-3 points in the total score of the Quality of Life Index has been associated with significant improvement in overall quality of life, self-image, physical symptoms, and general health in studies assessing change in quality of life.  PHQ-9: Review Flowsheet       11/11/2022 12/13/2017 09/21/2015  Depression screen PHQ 2/9  Decreased Interest 0 0 0  Down,  Depressed, Hopeless 0 0 0  PHQ - 2 Score 0 0 0  Altered sleeping 0 - -  Tired, decreased energy 1 - -  Change in appetite 0 - -  Feeling bad or failure about yourself  0 - -  Trouble concentrating 0 - -  Moving slowly or fidgety/restless 0 - -  Suicidal thoughts 0 - -  PHQ-9 Score 1 - -  Difficult doing work/chores Somewhat difficult - -   Interpretation of Total Score  Total Score Depression Severity:  1-4 = Minimal depression, 5-9 = Mild depression, 10-14 = Moderate depression, 15-19 = Moderately severe depression, 20-27 = Severe depression   Psychosocial Evaluation and Intervention:   Psychosocial Re-Evaluation:  Psychosocial Re-Evaluation     Row Name 11/20/22 0950 12/03/22 1638 12/29/22 1630         Psychosocial Re-Evaluation   Current issues with None Identified None Identified None Identified     Interventions Encouraged to attend Cardiac Rehabilitation for the exercise Encouraged to attend Cardiac Rehabilitation for the exercise Encouraged to attend Cardiac Rehabilitation for the exercise     Continue Psychosocial Services  No Follow up required No Follow up required No Follow up required              Psychosocial Discharge (Final Psychosocial Re-Evaluation):  Psychosocial Re-Evaluation - 12/29/22 1630       Psychosocial Re-Evaluation   Current issues with None Identified    Interventions Encouraged to attend Cardiac Rehabilitation for the exercise    Continue Psychosocial Services  No Follow up required             Vocational Rehabilitation: Provide vocational rehab assistance to qualifying candidates.   Vocational Rehab Evaluation & Intervention:  Vocational Rehab - 11/11/22 1354       Initial Vocational Rehab Evaluation & Intervention   Assessment shows need for Vocational Rehabilitation No   Bryan Hays is retired            Education: Education Goals: Education classes will be provided on a weekly basis, covering required topics. Participant  will state understanding/return demonstration of topics presented.    Education     Row Name 11/18/22 1300     Education   Cardiac Education Topics [employer]   Orthoptist   Educator Dietitian   Weekly Topic Tasty Appetizers and Snacks   Instruction Review Code 1- Verbalizes Understanding   Class Start Time 1150   Class Stop Time 1226   Class Time Calculation (min) 36 min    Row Name 11/20/22 1200     Education   Cardiac Education Topics [employer]   Licensed conveyancer Nutrition   Nutrition Other   Instruction Review Code 1- Verbalizes Understanding   Class Start Time 1150   Class Stop Time 1226   Class Time Calculation (min) 36 min    Row Name 11/23/22 1300     Education   Cardiac Education Topics [employer]   [employer] Workshops     Workshops   Educator Exercise Physiologist   Select Exercise   Exercise Workshop Exercise Basics: Building Your Action Plan   Instruction Review Code 1- Verbalizes Understanding   Class Start Time 1150   Class Stop Time 1235   Class Time Calculation (min) 45 min    Row Name 11/27/22 1300     Education   Cardiac Education Topics [employer]   Licensed conveyancer Nutrition   Nutrition Calorie Density   Instruction Review Code 1- Verbalizes Understanding   Class Start Time 1145   Class Stop Time 1225   Class Time Calculation (min) 40 min    Row Name 11/30/22 1400  Education   Cardiac Education Topics Pritikin   Licensed conveyancer Nutrition   Nutrition Nutrition Action Plan   Instruction Review Code 1- Verbalizes Understanding   Class Start Time 1145   Class Stop Time 1219   Class Time Calculation (min) 34 min    Row Name 12/02/22 1300     Education   Cardiac Education Topics Pritikin   Designer, multimedia    Weekly Topic Efficiency Cooking - Meals in a Snap   Instruction Review Code 1- Verbalizes Understanding   Class Start Time 1140   Class Stop Time 1220   Class Time Calculation (min) 40 min    Row Name 12/04/22 1400     Education   Cardiac Education Topics Pritikin   Psychologist, forensic Exercise Education   Exercise Education Move It!   Instruction Review Code 1- Verbalizes Understanding   Class Start Time 1150   Class Stop Time 1230   Class Time Calculation (min) 40 min    Row Name 12/07/22 1600     Education   Cardiac Education Topics Pritikin   Geographical information systems officer Psychosocial   Psychosocial Workshop Focused Goals, Sustainable Changes   Instruction Review Code 1- Verbalizes Understanding   Class Start Time 1150   Class Stop Time 1225   Class Time Calculation (min) 35 min    Row Name 12/09/22 1300     Education   Cardiac Education Topics Pritikin   Orthoptist   Educator Dietitian   Weekly Topic One-Pot Wonders   Instruction Review Code 1- Verbalizes Understanding   Class Start Time 1140   Class Stop Time 1212   Class Time Calculation (min) 32 min    Row Name 12/11/22 1400     Education   Cardiac Education Topics Pritikin   Hospital doctor Education   General Education Hypertension and Heart Disease   Instruction Review Code 1- Verbalizes Understanding   Class Start Time 1147   Class Stop Time 1220   Class Time Calculation (min) 33 min    Row Name 12/14/22 1300     Education   Cardiac Education Topics Pritikin   Select Core Videos     Core Videos   Educator Nurse   Select Exercise Education   Exercise Education Biomechanial Limitations   Instruction Review Code 1- Verbalizes Understanding   Class Start Time 1156   Class Stop Time 1232    Class Time Calculation (min) 36 min    Row Name 12/16/22 1300     Education   Cardiac Education Topics Pritikin   Customer service manager   Weekly Topic Comforting Weekend Breakfasts   Instruction Review Code 1- Verbalizes Understanding   Class Start Time 1135   Class Stop Time 1223   Class Time Calculation (min) 48 min    Row Name 12/18/22 1400     Education   Cardiac Education Topics Pritikin   Licensed conveyancer Nutrition  Nutrition Dining Out - Part 1   Instruction Review Code 1- Verbalizes Understanding   Class Start Time 1145   Class Stop Time 1222   Class Time Calculation (min) 37 min    Row Name 12/21/22 1400     Education   Cardiac Education Topics Pritikin   Select Core Videos     Core Videos   Educator Dietitian   Select Nutrition   Nutrition Facts on Fat   Instruction Review Code 1- Verbalizes Understanding   Class Start Time 1145   Class Stop Time 1225   Class Time Calculation (min) 40 min    Row Name 12/28/22 1700     Education   Cardiac Education Topics Pritikin   Geographical information systems officer Psychosocial   Psychosocial Workshop Healthy Sleep for a Healthy Heart   Instruction Review Code 1- Verbalizes Understanding   Class Start Time 1145   Class Stop Time 1233   Class Time Calculation (min) 48 min            Core Videos: Exercise    Move It!  Clinical staff conducted group or individual video education with verbal and written material and guidebook.  Patient learns the recommended Pritikin exercise program. Exercise with the goal of living a long, healthy life. Some of the health benefits of exercise include controlled diabetes, healthier blood pressure levels, improved cholesterol levels, improved heart and lung capacity, improved sleep, and better body composition. Everyone should speak with their  doctor before starting or changing an exercise routine.  Biomechanical Limitations Clinical staff conducted group or individual video education with verbal and written material and guidebook.  Patient learns how biomechanical limitations can impact exercise and how we can mitigate and possibly overcome limitations to have an impactful and balanced exercise routine.  Body Composition Clinical staff conducted group or individual video education with verbal and written material and guidebook.  Patient learns that body composition (ratio of muscle mass to fat mass) is a key component to assessing overall fitness, rather than body weight alone. Increased fat mass, especially visceral belly fat, can put [place] at increased risk for metabolic syndrome, type 2 diabetes, heart disease, and even death. It is recommended to combine diet and exercise (cardiovascular and resistance training) to improve your body composition. Seek guidance from your physician and exercise physiologist before implementing an exercise routine.  Exercise Action Plan Clinical staff conducted group or individual video education with verbal and written material and guidebook.  Patient learns the recommended strategies to achieve and enjoy long-term exercise adherence, including variety, self-motivation, self-efficacy, and positive decision making. Benefits of exercise include fitness, good health, weight management, more energy, better sleep, less stress, and overall well-being.  Medical   Heart Disease Risk Reduction Clinical staff conducted group or individual video education with verbal and written material and guidebook.  Patient learns our heart is our most vital organ as it circulates oxygen, nutrients, white blood cells, and hormones throughout the entire body, and carries waste away. Data supports a plant-based eating plan like the Pritikin Program for its effectiveness in slowing progression of and reversing heart disease. The  video provides a number of recommendations to address heart disease.   Metabolic Syndrome and Belly Fat  Clinical staff conducted group or individual video education with verbal and written material and guidebook.  Patient learns what metabolic syndrome is, how it leads to heart disease, and how one can reverse it  and keep it from coming back. You have metabolic syndrome if you have 3 of the following 5 criteria: abdominal obesity, high blood pressure, high triglycerides, low HDL cholesterol, and high blood sugar.  Hypertension and Heart Disease Clinical staff conducted group or individual video education with verbal and written material and guidebook.  Patient learns that high blood pressure, or hypertension, is very common in the [place]. Hypertension is largely due to excessive salt intake, but other important risk factors include being overweight, physical inactivity, drinking too much alcohol, smoking, and not eating enough potassium from fruits and vegetables. High blood pressure is a leading risk factor for heart attack, stroke, congestive heart failure, dementia, kidney failure, and premature death. Long-term effects of excessive salt intake include stiffening of the arteries and thickening of heart muscle and organ damage. Recommendations include ways to reduce hypertension and the risk of heart disease.  Diseases of Our Time - Focusing on Diabetes Clinical staff conducted group or individual video education with verbal and written material and guidebook.  Patient learns why the best way to stop diseases of our time is prevention, through food and other lifestyle changes. Medicine (such as prescription pills and surgeries) is often only a Band-Aid on the problem, not a long-term solution. Most common diseases of our time include obesity, type 2 diabetes, hypertension, heart disease, and cancer. The Pritikin Program is recommended and has been proven to help reduce, reverse, and/or prevent  the damaging effects of metabolic syndrome.  Nutrition   Overview of the Pritikin Eating Plan  Clinical staff conducted group or individual video education with verbal and written material and guidebook.  Patient learns about the Pritikin Eating Plan for disease risk reduction. The Pritikin Eating Plan emphasizes a wide variety of unrefined, minimally-processed carbohydrates, like fruits, vegetables, whole grains, and legumes. Go, Caution, and Stop food choices are explained. Plant-based and lean animal proteins are emphasized. Rationale provided for low sodium intake for blood pressure control, low added sugars for blood sugar stabilization, and low added fats and oils for coronary artery disease risk reduction and weight management.  Calorie Density  Clinical staff conducted group or individual video education with verbal and written material and guidebook.  Patient learns about calorie density and how it impacts the Pritikin Eating Plan. Knowing the characteristics of the food you choose will help you decide whether those foods will lead to weight gain or weight loss, and whether you want to consume more or less of them. Weight loss is usually a side effect of the Pritikin Eating Plan because of its focus on low calorie-dense foods.  Label Reading  Clinical staff conducted group or individual video education with verbal and written material and guidebook.  Patient learns about the Pritikin recommended label reading guidelines and corresponding recommendations regarding calorie density, added sugars, sodium content, and whole grains.  Dining Out - Part 1  Clinical staff conducted group or individual video education with verbal and written material and guidebook.  Patient learns that restaurant meals can be sabotaging because they can be so high in calories, fat, sodium, and/or sugar. Patient learns recommended strategies on how to positively address this and avoid unhealthy pitfalls.  Facts on  Fats  Clinical staff conducted group or individual video education with verbal and written material and guidebook.  Patient learns that lifestyle modifications can be just as effective, if not more so, as many medications for lowering your risk of heart disease. A Pritikin lifestyle can help to reduce your risk  of inflammation and atherosclerosis (cholesterol build-up, or plaque, in the artery walls). Lifestyle interventions such as dietary choices and physical activity address the cause of atherosclerosis. A review of the types of fats and their impact on blood cholesterol levels, along with dietary recommendations to reduce fat intake is also included.  Nutrition Action Plan  Clinical staff conducted group or individual video education with verbal and written material and guidebook.  Patient learns how to incorporate Pritikin recommendations into their lifestyle. Recommendations include planning and keeping personal health goals in mind as an important part of their success.  Healthy Mind-Set    Healthy Minds, Bodies, Hearts  Clinical staff conducted group or individual video education with verbal and written material and guidebook.  Patient learns how to identify when they are stressed. Video will discuss the impact of that stress, as well as the many benefits of stress management. Patient will also be introduced to stress management techniques. The way we think, act, and feel has an impact on our hearts.  How Our Thoughts Can Heal Our Hearts  Clinical staff conducted group or individual video education with verbal and written material and guidebook.  Patient learns that negative thoughts can cause depression and anxiety. This can result in negative lifestyle behavior and serious health problems. Cognitive behavioral therapy is an effective method to help control our thoughts in order to change and improve our emotional outlook.  Additional Videos:  Exercise    Improving Performance  Clinical  staff conducted group or individual video education with verbal and written material and guidebook.  Patient learns to use a non-linear approach by alternating intensity levels and lengths of time spent exercising to help burn more calories and lose more body fat. Cardiovascular exercise helps improve heart health, metabolism, hormonal balance, blood sugar control, and recovery from fatigue. Resistance training improves strength, endurance, balance, coordination, reaction time, metabolism, and muscle mass. Flexibility exercise improves circulation, posture, and balance. Seek guidance from your physician and exercise physiologist before implementing an exercise routine and learn your capabilities and proper form for all exercise.  Introduction to Yoga  Clinical staff conducted group or individual video education with verbal and written material and guidebook.  Patient learns about yoga, a discipline of the coming together of mind, breath, and body. The benefits of yoga include improved flexibility, improved range of motion, better posture and core strength, increased lung function, weight loss, and positive self-image. Yoga's heart health benefits include lowered blood pressure, healthier heart rate, decreased cholesterol and triglyceride levels, improved immune function, and reduced stress. Seek guidance from your physician and exercise physiologist before implementing an exercise routine and learn your capabilities and proper form for all exercise.  Medical   Aging: Enhancing Your Quality of Life  Clinical staff conducted group or individual video education with verbal and written material and guidebook.  Patient learns key strategies and recommendations to stay in good physical health and enhance quality of life, such as prevention strategies, having an advocate, securing a Health Care Proxy and Power of Attorney, and keeping a list of medications and system for tracking them. It also discusses how to  avoid risk for bone loss.  Biology of Weight Control  Clinical staff conducted group or individual video education with verbal and written material and guidebook.  Patient learns that weight gain occurs because we consume more calories than we burn (eating more, moving less). Even if your body weight is normal, you may have higher ratios of fat compared to muscle mass.  Too much body fat puts you at increased risk for cardiovascular disease, heart attack, stroke, type 2 diabetes, and obesity-related cancers. In addition to exercise, following the Pritikin Eating Plan can help reduce your risk.  Decoding Lab Results  Clinical staff conducted group or individual video education with verbal and written material and guidebook.  Patient learns that lab test reflects one measurement whose values change over time and are influenced by many factors, including medication, stress, sleep, exercise, food, hydration, pre-existing medical conditions, and more. It is recommended to use the knowledge from this video to become more involved with your lab results and evaluate your numbers to speak with your doctor.   Diseases of Our Time - Overview  Clinical staff conducted group or individual video education with verbal and written material and guidebook.  Patient learns that according to the CDC, 50% to 70% of chronic diseases (such as obesity, type 2 diabetes, elevated lipids, hypertension, and heart disease) are avoidable through lifestyle improvements including healthier food choices, listening to satiety cues, and increased physical activity.  Sleep Disorders Clinical staff conducted group or individual video education with verbal and written material and guidebook.  Patient learns how good quality and duration of sleep are important to overall health and well-being. Patient also learns about sleep disorders and how they impact health along with recommendations to address them, including discussing with a  physician.  Nutrition  Dining Out - Part 2 Clinical staff conducted group or individual video education with verbal and written material and guidebook.  Patient learns how to plan ahead and communicate in order to maximize their dining experience in a healthy and nutritious manner. Included are recommended food choices based on the type of restaurant the patient is visiting.   Fueling a Banker conducted group or individual video education with verbal and written material and guidebook.  There is a strong connection between our food choices and our health. Diseases like obesity and type 2 diabetes are very prevalent and are in large-part due to lifestyle choices. The Pritikin Eating Plan provides plenty of food and hunger-curbing satisfaction. It is easy to follow, affordable, and helps reduce health risks.  Menu Workshop  Clinical staff conducted group or individual video education with verbal and written material and guidebook.  Patient learns that restaurant meals can sabotage health goals because they are often packed with calories, fat, sodium, and sugar. Recommendations include strategies to plan ahead and to communicate with the manager, chef, or server to help order a healthier meal.  Planning Your Eating Strategy  Clinical staff conducted group or individual video education with verbal and written material and guidebook.  Patient learns about the Pritikin Eating Plan and its benefit of reducing the risk of disease. The Pritikin Eating Plan does not focus on calories. Instead, it emphasizes high-quality, nutrient-rich foods. By knowing the characteristics of the foods, we choose, we can determine their calorie density and make informed decisions.  Targeting Your Nutrition Priorities  Clinical staff conducted group or individual video education with verbal and written material and guidebook.  Patient learns that lifestyle habits have a tremendous impact on disease  risk and progression. This video provides eating and physical activity recommendations based on your personal health goals, such as reducing LDL cholesterol, losing weight, preventing or controlling type 2 diabetes, and reducing high blood pressure.  Vitamins and Minerals  Clinical staff conducted group or individual video education with verbal and written material and guidebook.  Patient learns different  ways to obtain key vitamins and minerals, including through a recommended healthy diet. It is important to discuss all supplements you take with your doctor.   Healthy Mind-Set    Smoking Cessation  Clinical staff conducted group or individual video education with verbal and written material and guidebook.  Patient learns that cigarette smoking and tobacco addiction pose a serious health risk which affects millions of people. Stopping smoking will significantly reduce the risk of heart disease, lung disease, and many forms of cancer. Recommended strategies for quitting are covered, including working with your doctor to develop a successful plan.  Culinary   Becoming a Set designer conducted group or individual video education with verbal and written material and guidebook.  Patient learns that cooking at home can be healthy, cost-effective, quick, and puts them in control. Keys to cooking healthy recipes will include looking at your recipe, assessing your equipment needs, planning ahead, making it simple, choosing cost-effective seasonal ingredients, and limiting the use of added fats, salts, and sugars.  Cooking - Breakfast and Snacks  Clinical staff conducted group or individual video education with verbal and written material and guidebook.  Patient learns how important breakfast is to satiety and nutrition through the entire day. Recommendations include key foods to eat during breakfast to help stabilize blood sugar levels and to prevent overeating at meals later in the day.  Planning ahead is also a key component.  Cooking - Educational psychologist conducted group or individual video education with verbal and written material and guidebook.  Patient learns eating strategies to improve overall health, including an approach to cook more at home. Recommendations include thinking of animal protein as a side on your plate rather than center stage and focusing instead on lower calorie dense options like vegetables, fruits, whole grains, and plant-based proteins, such as beans. Making sauces in large quantities to freeze for later and leaving the skin on your vegetables are also recommended to maximize your experience.  Cooking - Healthy Salads and Dressing Clinical staff conducted group or individual video education with verbal and written material and guidebook.  Patient learns that vegetables, fruits, whole grains, and legumes are the foundations of the Pritikin Eating Plan. Recommendations include how to incorporate each of these in flavorful and healthy salads, and how to create homemade salad dressings. Proper handling of ingredients is also covered. Cooking - Soups and [employer] - Soups and Desserts Clinical staff conducted group or individual video education with verbal and written material and guidebook.  Patient learns that Pritikin soups and desserts make for easy, nutritious, and delicious snacks and meal components that are low in sodium, fat, sugar, and calorie density, while high in vitamins, minerals, and filling fiber. Recommendations include simple and healthy ideas for soups and desserts.   Overview     The Pritikin Solution Program Overview Clinical staff conducted group or individual video education with verbal and written material and guidebook.  Patient learns that the results of the Pritikin Program have been documented in more than 100 articles published in peer-reviewed journals, and the benefits include reducing risk factors for  (and, in some cases, even reversing) high cholesterol, high blood pressure, type 2 diabetes, obesity, and more! An overview of the three key pillars of the Pritikin Program will be covered: eating well, doing regular exercise, and having a healthy mind-set.  WORKSHOPS  Exercise: Exercise Basics: Building Your Action Plan Clinical staff led group instruction and group discussion with  PowerPoint presentation and patient guidebook. To enhance the learning environment the use of posters, models and videos may be added. At the conclusion of this workshop, patients will comprehend the difference between physical activity and exercise, as well as the benefits of incorporating both, into their routine. Patients will understand the FITT (Frequency, Intensity, Time, and Type) principle and how to use it to build an exercise action plan. In addition, safety concerns and other considerations for exercise and cardiac rehab will be addressed by the presenter. The purpose of this lesson is to promote a comprehensive and effective weekly exercise routine in order to improve patients' overall level of fitness.   Managing Heart Disease: Your Path to a Healthier Heart Clinical staff led group instruction and group discussion with PowerPoint presentation and patient guidebook. To enhance the learning environment the use of posters, models and videos may be added.At the conclusion of this workshop, patients will understand the anatomy and physiology of the heart. Additionally, they will understand how Pritikin's three pillars impact the risk factors, the progression, and the management of heart disease.  The purpose of this lesson is to provide a high-level overview of the heart, heart disease, and how the Pritikin lifestyle positively impacts risk factors.  Exercise Biomechanics Clinical staff led group instruction and group discussion with PowerPoint presentation and patient guidebook. To enhance the learning  environment the use of posters, models and videos may be added. Patients will learn how the structural parts of their bodies function and how these functions impact their daily activities, movement, and exercise. Patients will learn how to promote a neutral spine, learn how to manage pain, and identify ways to improve their physical movement in order to promote healthy living. The purpose of this lesson is to expose patients to common physical limitations that impact physical activity. Participants will learn practical ways to adapt and manage aches and pains, and to minimize their effect on regular exercise. Patients will learn how to maintain good posture while sitting, walking, and lifting.  Balance Training and Fall Prevention  Clinical staff led group instruction and group discussion with PowerPoint presentation and patient guidebook. To enhance the learning environment the use of posters, models and videos may be added. At the conclusion of this workshop, patients will understand the importance of their sensorimotor skills (vision, proprioception, and the vestibular system) in maintaining their ability to balance as they age. Patients will apply a variety of balancing exercises that are appropriate for their current level of function. Patients will understand the common causes for poor balance, possible solutions to these problems, and ways to modify their physical environment in order to minimize their fall risk. The purpose of this lesson is to teach patients about the importance of maintaining balance as they age and ways to minimize their risk of falling.  WORKSHOPS   Nutrition:  Fueling a Ship broker led group instruction and group discussion with PowerPoint presentation and patient guidebook. To enhance the learning environment the use of posters, models and videos may be added. Patients will review the foundational principles of the Pritikin Eating Plan and understand  what constitutes a serving size in each of the food groups. Patients will also learn Pritikin-friendly foods that are better choices when away from home and review make-ahead meal and snack options. Calorie density will be reviewed and applied to three nutrition priorities: weight maintenance, weight loss, and weight gain. The purpose of this lesson is to reinforce (in a group setting) the key concepts  around what patients are recommended to eat and how to apply these guidelines when away from home by planning and selecting Pritikin-friendly options. Patients will understand how calorie density may be adjusted for different weight management goals.  Mindful Eating  Clinical staff led group instruction and group discussion with PowerPoint presentation and patient guidebook. To enhance the learning environment the use of posters, models and videos may be added. Patients will briefly review the concepts of the Pritikin Eating Plan and the importance of low-calorie dense foods. The concept of mindful eating will be introduced as well as the importance of paying attention to internal hunger signals. Triggers for non-hunger eating and techniques for dealing with triggers will be explored. The purpose of this lesson is to provide patients with the opportunity to review the basic principles of the Pritikin Eating Plan, discuss the value of eating mindfully and how to measure internal cues of hunger and fullness using the Hunger Scale. Patients will also discuss reasons for non-hunger eating and learn strategies to use for controlling emotional eating.  Targeting Your Nutrition Priorities Clinical staff led group instruction and group discussion with PowerPoint presentation and patient guidebook. To enhance the learning environment the use of posters, models and videos may be added. Patients will learn how to determine their genetic susceptibility to disease by reviewing their family history. Patients will gain  insight into the importance of diet as part of an overall healthy lifestyle in mitigating the impact of genetics and other environmental insults. The purpose of this lesson is to provide patients with the opportunity to assess their personal nutrition priorities by looking at their family history, their own health history and current risk factors. Patients will also be able to discuss ways of prioritizing and modifying the Pritikin Eating Plan for their highest risk areas  Menu  Clinical staff led group instruction and group discussion with PowerPoint presentation and patient guidebook. To enhance the learning environment the use of posters, models and videos may be added. Using menus brought in from [employer], or printed from [employer], patients will apply the Pritikin dining out guidelines that were presented in the [employer] video. Patients will also be able to practice these guidelines in a variety of provided scenarios. The purpose of this lesson is to provide patients with the opportunity to practice hands-on learning of the Pritikin Dining Out guidelines with actual menus and practice scenarios.  Label Reading Clinical staff led group instruction and group discussion with PowerPoint presentation and patient guidebook. To enhance the learning environment the use of posters, models and videos may be added. Patients will review and discuss the Pritikin label reading guidelines presented in Pritikin's Label Reading Educational series video. Using fool labels brought in from local grocery stores and markets, patients will apply the label reading guidelines and determine if the packaged food meet the Pritikin guidelines. The purpose of this lesson is to provide patients with the opportunity to review, discuss, and practice hands-on learning of the Pritikin Label Reading guidelines with actual packaged food labels. Cooking School  Pritikin's [employer] are  designed to teach patients ways to prepare quick, simple, and affordable recipes at home. The importance of nutrition's role in chronic disease risk reduction is reflected in its emphasis in the overall Pritikin program. By learning how to prepare essential core Pritikin Eating Plan recipes, patients will increase control over what they eat; be able to customize the flavor of foods without the use of added salt, sugar,  or fat; and improve the quality of the food they consume. By learning a set of core recipes which are easily assembled, quickly prepared, and affordable, patients are more likely to prepare more healthy foods at home. These workshops focus on convenient breakfasts, simple entres, side dishes, and desserts which can be prepared with minimal effort and are consistent with nutrition recommendations for cardiovascular risk reduction. Cooking [employer] are taught by a Armed forces logistics/support/administrative officer (RD) who has been trained by the [employer]. The chef or RD has a clear understanding of the importance of minimizing - if not completely eliminating - added fat, sugar, and sodium in recipes. Throughout the series of Cooking School Workshop sessions, patients will learn about healthy ingredients and efficient methods of cooking to build confidence in their capability to prepare    Cooking School weekly topics:  Adding Flavor- Sodium-Free  Fast and Healthy Breakfasts  Powerhouse Plant-Based Proteins  Satisfying Salads and Dressings  Simple Sides and Sauces  International Cuisine-Spotlight on the [employer] Zones  Delicious Desserts  Savory Soups  [employer] - Meals in a Astronomer Appetizers and Snacks  Comforting Weekend Breakfasts  One-Pot Wonders   Fast Evening Meals  Landscape architect Your Pritikin Plate  WORKSHOPS   Healthy Mindset (Psychosocial):  Focused Goals, Sustainable Changes Clinical staff led group instruction and group discussion  with PowerPoint presentation and patient guidebook. To enhance the learning environment the use of posters, models and videos may be added. Patients will be able to apply effective goal setting strategies to establish at least one personal goal, and then take consistent, meaningful action toward that goal. They will learn to identify common barriers to achieving personal goals and develop strategies to overcome them. Patients will also gain an understanding of how our mind-set can impact our ability to achieve goals and the importance of cultivating a positive and growth-oriented mind-set. The purpose of this lesson is to provide patients with a deeper understanding of how to set and achieve personal goals, as well as the tools and strategies needed to overcome common obstacles which may arise along the way.  From Head to Heart: The Power of a Healthy Outlook  Clinical staff led group instruction and group discussion with PowerPoint presentation and patient guidebook. To enhance the learning environment the use of posters, models and videos may be added. Patients will be able to recognize and describe the impact of emotions and mood on physical health. They will discover the importance of self-care and explore self-care practices which may work for them. Patients will also learn how to utilize the 4 C's to cultivate a healthier outlook and better manage stress and challenges. The purpose of this lesson is to demonstrate to patients how a healthy outlook is an essential part of maintaining good health, especially as they continue their cardiac rehab journey.  Healthy Sleep for a Healthy Heart Clinical staff led group instruction and group discussion with PowerPoint presentation and patient guidebook. To enhance the learning environment the use of posters, models and videos may be added. At the conclusion of this workshop, patients will be able to demonstrate knowledge of the importance of sleep to overall  health, well-being, and quality of life. They will understand the symptoms of, and treatments for, common sleep disorders. Patients will also be able to identify daytime and nighttime behaviors which impact sleep, and they will be able to apply these tools to help manage sleep-related challenges. The purpose of this  lesson is to provide patients with a general overview of sleep and outline the importance of quality sleep. Patients will learn about a few of the most common sleep disorders. Patients will also be introduced to the concept of "sleep hygiene," and discover ways to self-manage certain sleeping problems through simple daily behavior changes. Finally, the workshop will motivate patients by clarifying the links between quality sleep and their goals of heart-healthy living.   Recognizing and Reducing Stress Clinical staff led group instruction and group discussion with PowerPoint presentation and patient guidebook. To enhance the learning environment the use of posters, models and videos may be added. At the conclusion of this workshop, patients will be able to understand the types of stress reactions, differentiate between acute and chronic stress, and recognize the impact that chronic stress has on their health. They will also be able to apply different coping mechanisms, such as reframing negative self-talk. Patients will have the opportunity to practice a variety of stress management techniques, such as deep abdominal breathing, progressive muscle relaxation, and/or guided imagery.  The purpose of this lesson is to educate patients on the role of stress in their lives and to provide healthy techniques for coping with it.  Learning Barriers/Preferences:  Learning Barriers/Preferences - 11/11/22 1352       Learning Barriers/Preferences   Learning Barriers Sight   pt wears glasses   Learning Preferences Computer/Internet;Group Instruction;Individual Instruction;Pictoral;Skilled  Demonstration;Verbal Instruction;Video;Written Material             Education Topics:  Knowledge Questionnaire Score:  Knowledge Questionnaire Score - 11/11/22 1353       Knowledge Questionnaire Score   Pre Score 19/24             Core Components/Risk Factors/Patient Goals at Admission:  Personal Goals and Risk Factors at Admission - 11/11/22 1354       Core Components/Risk Factors/Patient Goals on Admission    Weight Management Yes;Obesity;Weight Loss    Intervention Weight Management: Provide education and appropriate resources to help participant work on and attain dietary goals.;Weight Management: Develop a combined nutrition and exercise program designed to reach desired caloric intake, while maintaining appropriate intake of nutrient and fiber, sodium and fats, and appropriate energy expenditure required for the weight goal.;Weight Management/Obesity: Establish reasonable short term and long term weight goals.;Obesity: Provide education and appropriate resources to help participant work on and attain dietary goals.    Admit Weight 225 lb 1.4 oz (102.1 kg)    Goal Weight: Short Term 215 lb (97.5 kg)    Goal Weight: Long Term 205 lb (93 kg)    Expected Outcomes Short Term: Continue to assess and modify interventions until short term weight is achieved;Long Term: Adherence to nutrition and physical activity/exercise program aimed toward attainment of established weight goal;Understanding recommendations for meals to include 15-35% energy as protein, 25-35% energy from fat, 35-60% energy from carbohydrates, less than 200mg  of dietary cholesterol, 20-35 gm of total fiber daily;Understanding of distribution of calorie intake throughout the day with the consumption of 4-5 meals/snacks;Weight Loss: Understanding of general recommendations for a balanced deficit meal plan, which promotes 1-2 lb weight loss per week and includes a negative energy balance of [phone] kcal/d     Hypertension Yes    Intervention Provide education on lifestyle modifcations including regular physical activity/exercise, weight management, moderate sodium restriction and increased consumption of fresh fruit, vegetables, and low fat dairy, alcohol moderation, and smoking cessation.;Monitor prescription use compliance.    Expected Outcomes Short Term: Continued  assessment and intervention until BP is < 140/65mm HG in hypertensive participants. < 130/64mm HG in hypertensive participants with diabetes, heart failure or chronic kidney disease.;Long Term: Maintenance of blood pressure at goal levels.    Lipids Yes    Intervention Provide education and support for participant on nutrition & aerobic/resistive exercise along with prescribed medications to achieve LDL 70mg , HDL >40mg .    Expected Outcomes Short Term: Participant states understanding of desired cholesterol values and is compliant with medications prescribed. Participant is following exercise prescription and nutrition guidelines.;Long Term: Cholesterol controlled with medications as prescribed, with individualized exercise RX and with personalized nutrition plan. Value goals: LDL < 70mg , HDL > 40 mg.             Core Components/Risk Factors/Patient Goals Review:   Goals and Risk Factor Review     Row Name 11/20/22 0953 12/03/22 1639 12/29/22 1633         Core Components/Risk Factors/Patient Goals Review   Personal Goals Review Weight Management/Obesity;Hypertension;Lipids Weight Management/Obesity;Hypertension;Lipids Weight Management/Obesity;Hypertension;Lipids     Review Bryan Hays started intensive cardiac rehab on 11/18/22 and did well with exercise, Vital signs were stable Bryan Hays is doing well with exercise at  intensive cardiac rehab, Vital signs have been stable Bryan Hays continues to do well with exercise at  intensive cardiac rehab, Vital signs remain been stable     Expected Outcomes Bryan Hays will continue to participate in intensive  cardiac rehab for exercise, nutritiohn and lifestyle modifications Bryan Hays will continue to participate in intensive cardiac rehab for exercise, nutritiohn and lifestyle modifications Bryan Hays will continue to participate in intensive cardiac rehab for exercise, nutritiohn and lifestyle modifications              Core Components/Risk Factors/Patient Goals at Discharge (Final Review):   Goals and Risk Factor Review - 12/29/22 1633       Core Components/Risk Factors/Patient Goals Review   Personal Goals Review Weight Management/Obesity;Hypertension;Lipids    Review Bryan Hays continues to do well with exercise at  intensive cardiac rehab, Vital signs remain been stable    Expected Outcomes Bryan Hays will continue to participate in intensive cardiac rehab for exercise, nutritiohn and lifestyle modifications             ITP Comments:  ITP Comments     Row Name 11/11/22 1014 11/20/22 0949 12/03/22 1637 12/29/22 1629     ITP Comments Dr. Armanda Magic medical director. Introduction to pritikin education/ intensive cardiac rehab. Initial orientation packet reviewed with patient. 30 Day ITP Review. Bryan Hays started intensive cardiac rehab on 11/18/22 and did well with exercise 30 Day ITP Review. Bryan Hays has good attendance and participation in  intensive cardiac rehab 30 Day ITP Review. Bryan Hays continues to have  good attendance and participation in  intensive cardiac rehab             Comments: See ITP comments.

## 2022-12-30 ENCOUNTER — Encounter (HOSPITAL_COMMUNITY): Payer: Medicare HMO

## 2023-01-01 ENCOUNTER — Encounter (HOSPITAL_COMMUNITY)
Admission: RE | Admit: 2023-01-01 | Discharge: 2023-01-01 | Disposition: A | Discharge status: Home / Self Care | Payer: Medicare HMO | Source: Ambulatory Visit | Attending: Cardiology | Admitting: Cardiology

## 2023-01-01 DIAGNOSIS — Z955 Presence of coronary angioplasty implant and graft: Secondary | ICD-10-CM

## 2023-01-04 ENCOUNTER — Encounter (HOSPITAL_COMMUNITY)
Admission: RE | Admit: 2023-01-04 | Discharge: 2023-01-04 | Disposition: A | Discharge status: Home / Self Care | Payer: Medicare HMO | Source: Ambulatory Visit | Attending: Cardiology | Admitting: Cardiology

## 2023-01-04 DIAGNOSIS — Z955 Presence of coronary angioplasty implant and graft: Secondary | ICD-10-CM

## 2023-01-05 NOTE — Progress Notes (Signed)
HPI: Follow-up coronary artery disease. Exercise treadmill November 2023-patient exercised for 3 minutes and 22 seconds achieving a maximum heart rate of 153, no chest pain, normal blood pressure response, no ST changes.  Calcium score January 2024 3834 which was 96 percentile; left main 280, LAD 1264, RCA 2136 and circumflex 154.  Cardiac catheterization February 2024 showed 60% mid LAD, 80% first diagonal, 80% RPAV followed by 90% lesion, 60% first marginal.  Patient had PCI of his LAD.  Diagonal and RPAV felt to be small and obtuse marginal disease moderate.  Medical therapy recommended.  Since last seen he has dyspnea on exertion has improved since his stent was placed.  There is no chest pain, palpitations or syncope.  Current Outpatient Medications  Medication Sig Dispense Refill   acetaminophen (TYLENOL) 650 MG CR tablet Take 650 mg by mouth every 8 (eight) hours as needed for pain.     aspirin 81 MG EC tablet Take 81 mg by mouth daily.       cholecalciferol (VITAMIN D3) 25 MCG (1000 UNIT) tablet Take 1,000 Units by mouth daily.     clopidogrel (PLAVIX) 75 MG tablet Take 1 tablet (75 mg total) by mouth daily with breakfast. 90 tablet 3   cyanocobalamin (VITAMIN B12) 1000 MCG tablet Take 1,000 mcg by mouth daily.     Evolocumab (REPATHA SURECLICK) 140 MG/ML SOAJ Inject 140 mg into the skin every 14 (fourteen) days. 6 mL 3   gabapentin (NEURONTIN) 300 MG capsule Take 300 mg by mouth at bedtime.     lisinopril (PRINIVIL,ZESTRIL) 5 MG tablet Take 5 mg by mouth daily.  0   metoprolol succinate (TOPROL XL) 25 MG 24 hr tablet Take 1 tablet (25 mg total) by mouth daily. 90 tablet 3   neomycin-polymyxin-dexamethasone (MAXITROL) 0.1 % ophthalmic suspension Place 1 drop into both eyes daily as needed (irritation).     nitroGLYCERIN (NITROSTAT) 0.4 MG SL tablet Place 1 tablet (0.4 mg total) under the tongue every 5 (five) minutes as needed for chest pain. 25 tablet 3   pantoprazole (PROTONIX) 40 MG  tablet Take 1 tablet (40 mg total) by mouth daily. 90 tablet 3   Phenylephrine-APAP-guaiFENesin (MUCINEX FAST-MAX) 10-650-400 MG/20ML LIQD Take 20 mLs by mouth daily as needed (congestion).     rosuvastatin (CRESTOR) 40 MG tablet Take 1 tablet (40 mg total) by mouth daily. TAKE 1 TABLET BY MOUTH EVERY DAY 90 tablet 3   No current facility-administered medications for this visit.     Past Medical History:  Diagnosis Date   Allergic rhinitis due to pollen    CAD (coronary artery disease)    Degeneration of lumbar or lumbosacral intervertebral disc    GERD (gastroesophageal reflux disease)    Hyperlipidemia    Hypertension    Nephrolithiasis    Personal history of urinary calculi    Tendinitis     Past Surgical History:  Procedure Laterality Date   back surgery     CORONARY ATHERECTOMY N/A 10/15/2022   Procedure: CORONARY ATHERECTOMY;  Surgeon: Orbie Pyo, MD;  Location: [facility];  Service: Cardiovascular;  Laterality: N/A;   CORONARY IMAGING/OCT N/A 10/15/2022   Procedure: INTRAVASCULAR IMAGING/OCT;  Surgeon: Orbie Pyo, MD;  Location: [facility];  Service: Cardiovascular;  Laterality: N/A;   CORONARY LITHOTRIPSY N/A 10/15/2022   Procedure: CORONARY LITHOTRIPSY;  Surgeon: Orbie Pyo, MD;  Location: [facility];  Service: Cardiovascular;  Laterality: N/A;   CORONARY  PRESSURE/FFR STUDY N/A 10/15/2022   Procedure: INTRAVASCULAR PRESSURE WIRE/FFR STUDY;  Surgeon: Orbie Pyo, MD;  Location: [facility];  Service: Cardiovascular;  Laterality: N/A;   CORONARY STENT INTERVENTION N/A 10/15/2022   Procedure: CORONARY STENT INTERVENTION;  Surgeon: Orbie Pyo, MD;  Location: [facility];  Service: Cardiovascular;  Laterality: N/A;   FOOT SURGERY     Hx plantar fasciitis   LEFT HEART CATH AND CORONARY ANGIOGRAPHY N/A 10/15/2022   Procedure: LEFT HEART CATH AND CORONARY ANGIOGRAPHY;  Surgeon: Orbie Pyo, MD;  Location: [facility];  Service: Cardiovascular;  Laterality: N/A;   SHOULDER ARTHROSCOPY  08/24/2010   right shoulder    TONSILLECTOMY      Social History   Socioeconomic History   Marital status: Married    Spouse name: Not on file   Number of children: 2   Years of education: 16   Highest education level: Not on file  Occupational History   Occupation: [employer]    Employer: RETIRED    Comment: fiberoptic switches, 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 [employer] less than a year. 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  Married '74--7 years divorced; married '84. 1 son -'80, daughter -'15; 2 grandsons, 3 step grandchildren. Retired from  [employer]. Enjoying his retirement. 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 End of Life care: provided packet.    Social Determinants of Health   Financial Resource Strain: Not on file  Food Insecurity: No Food Insecurity (10/16/2022)   Hunger Vital Sign    Worried About Running Out of Food in the Last Year: Never true    Ran Out of Food in the Last Year: Never true  Transportation Needs: No Transportation Needs (10/16/2022)   PRAPARE - Administrator, Civil Service (Medical): No    Lack of Transportation (Non-Medical): No  Physical Activity: Not on file  Stress: Not on file  Social Connections: Not on file  Intimate Partner Violence: Not At Risk (10/16/2022)   Humiliation, Afraid, Rape, and Kick questionnaire    Fear of Current or Ex-Partner: No    Emotionally Abused: No    Physically Abused: No    Sexually Abused: No    Family History  Problem Relation Age of Onset   Heart disease Mother         cardiovascular disease   Alzheimer's disease Mother    Diabetes Mother    Heart attack Father    COPD Father    Coronary artery disease Father    Heart disease Father        chf   Hypertension Father    Diabetes Father    Other Father        Cdiff   Cancer Neg Hx        colon or prostate    ROS: no fevers or chills, productive cough, hemoptysis, dysphasia, odynophagia, melena, hematochezia, dysuria, hematuria, rash, seizure activity, orthopnea, PND, pedal edema, claudication. Remaining systems  are negative.  Physical Exam: Well-developed well-nourished in no acute distress.  Skin is warm and dry.  HEENT is normal.  Neck is supple.  Chest is clear to auscultation with normal expansion.  Cardiovascular exam is regular rate and rhythm.  Abdominal exam nontender or distended. No masses palpated. Extremities show no edema. neuro grossly intact   A/P  1 coronary artery disease-patient is status post PCI of the LAD.  Continue aspirin and Plavix.  Continue Toprol.  Continue statin.  2 hyperlipidemia-continue Crestor and Repatha.  3 hypertension-patient's blood pressure is controlled.  Continue present medications.  Olga Millers, MD

## 2023-01-06 ENCOUNTER — Encounter (HOSPITAL_COMMUNITY)
Admission: RE | Admit: 2023-01-06 | Discharge: 2023-01-06 | Disposition: A | Discharge status: Home / Self Care | Payer: Medicare HMO | Source: Ambulatory Visit | Attending: Cardiology | Admitting: Cardiology

## 2023-01-06 DIAGNOSIS — Z955 Presence of coronary angioplasty implant and graft: Secondary | ICD-10-CM | POA: Diagnosis not present

## 2023-01-08 ENCOUNTER — Encounter (HOSPITAL_COMMUNITY)
Admission: RE | Admit: 2023-01-08 | Discharge: 2023-01-08 | Disposition: A | Discharge status: Home / Self Care | Payer: Medicare HMO | Source: Ambulatory Visit | Attending: Cardiology | Admitting: Cardiology

## 2023-01-08 DIAGNOSIS — Z955 Presence of coronary angioplasty implant and graft: Secondary | ICD-10-CM | POA: Diagnosis not present

## 2023-01-11 ENCOUNTER — Encounter (HOSPITAL_COMMUNITY)
Admission: RE | Admit: 2023-01-11 | Discharge: 2023-01-11 | Disposition: A | Discharge status: Home / Self Care | Payer: Medicare HMO | Source: Ambulatory Visit | Attending: Cardiology | Admitting: Cardiology

## 2023-01-11 DIAGNOSIS — Z955 Presence of coronary angioplasty implant and graft: Secondary | ICD-10-CM | POA: Diagnosis not present

## 2023-01-12 ENCOUNTER — Ambulatory Visit: Payer: Medicare HMO | Attending: Cardiology | Admitting: Cardiology

## 2023-01-12 ENCOUNTER — Encounter: Payer: Self-pay | Admitting: Cardiology

## 2023-01-12 VITALS — BP 118/60 | HR 60 | Ht 66.5 in | Wt 220.0 lb

## 2023-01-12 DIAGNOSIS — I2583 Coronary atherosclerosis due to lipid rich plaque: Secondary | ICD-10-CM

## 2023-01-12 DIAGNOSIS — I2510 Atherosclerotic heart disease of native coronary artery without angina pectoris: Secondary | ICD-10-CM | POA: Diagnosis not present

## 2023-01-12 DIAGNOSIS — E785 Hyperlipidemia, unspecified: Secondary | ICD-10-CM

## 2023-01-12 DIAGNOSIS — I10 Essential (primary) hypertension: Secondary | ICD-10-CM

## 2023-01-12 MED ORDER — PANTOPRAZOLE SODIUM 40 MG PO TBEC: 40.0000 mg | DELAYED_RELEASE_TABLET | Freq: Every day | ORAL | 3 refills | Status: DC

## 2023-01-12 MED ORDER — CLOPIDOGREL BISULFATE 75 MG PO TABS: 75.0000 mg | ORAL_TABLET | Freq: Every day | ORAL | 3 refills | Status: DC

## 2023-01-12 NOTE — Patient Instructions (Signed)
    Follow-Up: At [facility], you and your health needs are our priority.  As part of our continuing mission to provide you with exceptional heart care, we have created designated Provider Care Teams.  These Care Teams include your primary Cardiologist (physician) and Advanced Practice Providers (APPs -  Physician Assistants and Nurse Practitioners) who all work together to provide you with the care you need, when you need it.  We recommend signing up for the patient portal called "MyChart".  Sign up information is provided on this After Visit Summary.  MyChart is used to connect with patients for Virtual Visits (Telemedicine).  Patients are able to view lab/test results, encounter notes, upcoming appointments, etc.  Non-urgent messages can be sent to your provider as well.   To learn more about what you can do with MyChart, go to [email or web address].    Your next appointment:   6 month(s)  Provider:   Brian Crenshaw, MD      

## 2023-01-13 ENCOUNTER — Encounter (HOSPITAL_COMMUNITY)
Admission: RE | Admit: 2023-01-13 | Discharge: 2023-01-13 | Disposition: A | Discharge status: Home / Self Care | Payer: Medicare HMO | Source: Ambulatory Visit | Attending: Cardiology | Admitting: Cardiology

## 2023-01-13 DIAGNOSIS — Z955 Presence of coronary angioplasty implant and graft: Secondary | ICD-10-CM | POA: Diagnosis not present

## 2023-01-15 ENCOUNTER — Encounter (HOSPITAL_COMMUNITY)
Admission: RE | Admit: 2023-01-15 | Discharge: 2023-01-15 | Disposition: A | Discharge status: Home / Self Care | Payer: Medicare HMO | Source: Ambulatory Visit | Attending: Cardiology | Admitting: Cardiology

## 2023-01-15 VITALS — Ht 66.5 in | Wt 219.8 lb

## 2023-01-15 DIAGNOSIS — Z955 Presence of coronary angioplasty implant and graft: Secondary | ICD-10-CM

## 2023-01-19 ENCOUNTER — Telehealth (HOSPITAL_COMMUNITY): Payer: Self-pay

## 2023-01-20 ENCOUNTER — Encounter (HOSPITAL_COMMUNITY): Payer: Medicare HMO

## 2023-01-22 ENCOUNTER — Encounter (HOSPITAL_COMMUNITY): Payer: Medicare HMO

## 2023-01-28 DIAGNOSIS — T1512XA Foreign body in conjunctival sac, left eye, initial encounter: Secondary | ICD-10-CM | POA: Diagnosis not present

## 2023-01-28 DIAGNOSIS — H10412 Chronic giant papillary conjunctivitis, left eye: Secondary | ICD-10-CM | POA: Diagnosis not present

## 2023-01-29 NOTE — Progress Notes (Signed)
Discharge Progress Report  Patient Details  Name: Bryan Hays MRN: [id number] Date of Birth: [birth year] Referring Provider:   Flowsheet Row INTENSIVE CARDIAC REHAB ORIENT from 11/11/2022 in [facility]  Referring Provider Olga Millers, MD        Number of Visits: 92  Reason for Discharge:  Patient reached a stable level of exercise. Patient independent in their exercise. Patient has met program and personal goals.  Smoking History:  Social History   Tobacco Use  Smoking Status Never  Smokeless Tobacco Never    Diagnosis:  S/P drug eluting coronary stent placement  ADL [facility]:   Initial Exercise Prescription:  Initial Exercise Prescription - 11/11/22 1300       Date of Initial Exercise RX and Referring Provider   Date 11/11/22    Referring Provider Olga Millers, MD    Expected Discharge Date 01/22/23      Bike   Level 1.5    Watts 20    Minutes 15    METs 2.5      Recumbant Elliptical   Level 2    RPM 50    Watts 80    Minutes 15    METs 2.5      Prescription Details   Frequency (times per week) 3    Duration Progress to 30 minutes of continuous aerobic without signs/symptoms of physical distress      Intensity   THRR 40-80% of Max Heartrate 57-115    Ratings of Perceived Exertion 11-13    Perceived Dyspnea 0-4      Progression   Progression Continue progressive overload as per policy without signs/symptoms or physical distress.      Resistance Training   Training Prescription Yes    Weight 3    Reps 10-15             Discharge Exercise Prescription (Final Exercise Prescription Changes):  Exercise Prescription Changes - 01/15/23 1630       Response to Exercise   Blood Pressure (Admit) 125/71    Blood Pressure (Exercise) 148/72    Blood Pressure (Exit) 104/66    Heart Rate (Admit) 50 bpm    Heart Rate (Exercise) 97 bpm    Heart Rate (Exit) 61 bpm    Rating of Perceived  Exertion (Exercise) 12    Symptoms None    Comments Pt's last day of exercise in the CRP2 program    Duration Continue with 30 min of aerobic exercise without signs/symptoms of physical distress.    Intensity THRR unchanged      Progression   Progression Continue to progress workloads to maintain intensity without signs/symptoms of physical distress.    Average METs 3.35      Resistance Training   Training Prescription Yes    Weight 5 lbs    Reps 10-15    Time 10 Minutes      Interval Training   Interval Training No      Bike   Level 2    Minutes 15    METs 3      Recumbant Elliptical   Level 2    RPM 69    Watts 95    Minutes 15    METs 3.7      Home Exercise Plan   Plans to continue exercise at Home (comment)    Frequency Add 4 additional days to program exercise sessions.    Initial Home Exercises Provided 12/21/22  Functional Capacity:  6 Minute Walk     Row Name 11/11/22 1343 01/15/23 1252       6 Minute Walk   Phase Initial Discharge    Distance 1724 feet 1591 feet    Distance % Change -- -7.71 %    Distance Feet Change -- -133 ft    Walk Time 6 minutes 6 minutes    # of Rest Breaks 0 0    MPH 3.27 3    METS 2.89 2.74    RPE 12 11.5    Perceived Dyspnea  1 1    VO2 Peak 10.14 9.6    Symptoms Yes (comment) Yes (comment)    Comments mild SOB, resolved with rest. no pain Mild SOB, RPD = 1    Resting HR 62 bpm 50 bpm    Resting BP 106/62 125/71    Resting Oxygen Saturation  96 % --    Exercise Oxygen Saturation  during 6 min walk 98 % --    Max Ex. HR 108 bpm 97 bpm    Max Ex. BP 130/62 148/72    2 Minute Post BP 104/66 --             Psychological, QOL, Others - Outcomes: PHQ 2/9:    11/11/2022    1:42 PM 12/13/2017    2:31 PM 09/21/2015    8:18 AM  Depression screen PHQ 2/9  Decreased Interest 0 0 0  Down, Depressed, Hopeless 0 0 0  PHQ - 2 Score 0 0 0  Altered sleeping 0    Tired, decreased energy 1    Change in  appetite 0    Feeling bad or failure about yourself  0    Trouble concentrating 0    Moving slowly or fidgety/restless 0    Suicidal thoughts 0    PHQ-9 Score 1    Difficult doing work/chores Somewhat difficult      Quality of Life:  Quality of Life - 01/13/23 1600       Quality of Life Scores   Health/Function Pre 28.63 %    Health/Function Post 26.5 %    Health/Function % Change -7.44 %    Socioeconomic Pre 27.58 %    Socioeconomic Post 25.21 %    Socioeconomic % Change  -8.59 %    Psych/Spiritual Pre 30 %    Psych/Spiritual Post 25.71 %    Psych/Spiritual % Change -14.3 %    Family Pre 30 %    Family Post 26.4 %    Family % Change -12 %    GLOBAL Pre 28.94 %    GLOBAL Post 26.06 %    GLOBAL % Change -9.95 %             Personal Goals: Goals established at orientation with interventions provided to work toward goal.  Personal Goals and Risk Factors at Admission - 11/11/22 1354       Core Components/Risk Factors/Patient Goals on Admission    Weight Management Yes;Obesity;Weight Loss    Intervention Weight Management: Provide education and appropriate resources to help participant work on and attain dietary goals.;Weight Management: Develop a combined nutrition and exercise program designed to reach desired caloric intake, while maintaining appropriate intake of nutrient and fiber, sodium and fats, and appropriate energy expenditure required for the weight goal.;Weight Management/Obesity: Establish reasonable short term and long term weight goals.;Obesity: Provide education and appropriate resources to help participant work on and attain dietary goals.    Admit  Weight 225 lb 1.4 oz (102.1 kg)    Goal Weight: Short Term 215 lb (97.5 kg)    Goal Weight: Long Term 205 lb (93 kg)    Expected Outcomes Short Term: Continue to assess and modify interventions until short term weight is achieved;Long Term: Adherence to nutrition and physical activity/exercise program aimed  toward attainment of established weight goal;Understanding recommendations for meals to include 15-35% energy as protein, 25-35% energy from fat, 35-60% energy from carbohydrates, less than 200mg  of dietary cholesterol, 20-35 gm of total fiber daily;Understanding of distribution of calorie intake throughout the day with the consumption of 4-5 meals/snacks;Weight Loss: Understanding of general recommendations for a balanced deficit meal plan, which promotes 1-2 lb weight loss per week and includes a negative energy balance of [phone] kcal/d    Hypertension Yes    Intervention Provide education on lifestyle modifcations including regular physical activity/exercise, weight management, moderate sodium restriction and increased consumption of fresh fruit, vegetables, and low fat dairy, alcohol moderation, and smoking cessation.;Monitor prescription use compliance.    Expected Outcomes Short Term: Continued assessment and intervention until BP is < 140/17mm HG in hypertensive participants. < 130/62mm HG in hypertensive participants with diabetes, heart failure or chronic kidney disease.;Long Term: Maintenance of blood pressure at goal levels.    Lipids Yes    Intervention Provide education and support for participant on nutrition & aerobic/resistive exercise along with prescribed medications to achieve LDL 70mg , HDL >40mg .    Expected Outcomes Short Term: Participant states understanding of desired cholesterol values and is compliant with medications prescribed. Participant is following exercise prescription and nutrition guidelines.;Long Term: Cholesterol controlled with medications as prescribed, with individualized exercise RX and with personalized nutrition plan. Value goals: LDL < 70mg , HDL > 40 mg.              Personal Goals Discharge:  Goals and Risk Factor Review     Row Name 11/20/22 0953 12/03/22 1639 12/29/22 1633         Core Components/Risk Factors/Patient Goals Review   Personal  Goals Review Weight Management/Obesity;Hypertension;Lipids Weight Management/Obesity;Hypertension;Lipids Weight Management/Obesity;Hypertension;Lipids     Review Raiford Noble started intensive cardiac rehab on 11/18/22 and did well with exercise, Vital signs were stable Raiford Noble is doing well with exercise at  intensive cardiac rehab, Vital signs have been stable Raiford Noble continues to do well with exercise at  intensive cardiac rehab, Vital signs remain been stable     Expected Outcomes Raiford Noble will continue to participate in intensive cardiac rehab for exercise, nutritiohn and lifestyle modifications Raiford Noble will continue to participate in intensive cardiac rehab for exercise, nutritiohn and lifestyle modifications Raiford Noble will continue to participate in intensive cardiac rehab for exercise, nutritiohn and lifestyle modifications              Exercise Goals and Review:  Exercise Goals     Row Name 11/11/22 1350             Exercise Goals   Increase Physical Activity Yes       Intervention Provide advice, education, support and counseling about physical activity/exercise needs.;Develop an individualized exercise prescription for aerobic and resistive training based on initial evaluation findings, risk stratification, comorbidities and participant's personal goals.       Expected Outcomes Short Term: Attend rehab on a regular basis to increase amount of physical activity.;Long Term: Exercising regularly at least 3-5 days a week.;Long Term: Add in home exercise to make exercise part of routine and to increase amount of physical  activity.       Increase Strength and Stamina Yes       Intervention Provide advice, education, support and counseling about physical activity/exercise needs.;Develop an individualized exercise prescription for aerobic and resistive training based on initial evaluation findings, risk stratification, comorbidities and participant's personal goals.       Expected Outcomes Short Term: Perform  resistance training exercises routinely during rehab and add in resistance training at home;Short Term: Increase workloads from initial exercise prescription for resistance, speed, and METs.;Long Term: Improve cardiorespiratory fitness, muscular endurance and strength as measured by increased METs and functional capacity ( )       Able to understand and use rate of perceived exertion (RPE) scale Yes       Intervention Provide education and explanation on how to use RPE scale       Expected Outcomes Short Term: Able to use RPE daily in rehab to express subjective intensity level;Long Term:  Able to use RPE to guide intensity level when exercising independently       Able to understand and use Dyspnea scale Yes       Intervention Provide education and explanation on how to use Dyspnea scale       Expected Outcomes Short Term: Able to use Dyspnea scale daily in rehab to express subjective sense of shortness of breath during exertion;Long Term: Able to use Dyspnea scale to guide intensity level when exercising independently       Knowledge and understanding of Target Heart Rate Range (THRR) Yes       Intervention Provide education and explanation of THRR including how the numbers were predicted and where they are located for reference       Expected Outcomes Long Term: Able to use THRR to govern intensity when exercising independently;Short Term: Able to state/look up THRR;Short Term: Able to use daily as guideline for intensity in rehab       Understanding of Exercise Prescription Yes       Intervention Provide education, explanation, and written materials on patient's individual exercise prescription       Expected Outcomes Short Term: Able to explain program exercise prescription;Long Term: Able to explain home exercise prescription to exercise independently                Exercise Goals Re-Evaluation:  Exercise Goals Re-Evaluation     Row Name 11/18/22 1419 12/09/22 1500 01/04/23 1427          Exercise Goal Re-Evaluation   Exercise Goals Review Increase Physical Activity;Increase Strength and Stamina;Able to understand and use rate of perceived exertion (RPE) scale;Knowledge and understanding of Target Heart Rate Range (THRR);Understanding of Exercise Prescription Increase Physical Activity;Increase Strength and Stamina;Able to understand and use rate of perceived exertion (RPE) scale;Knowledge and understanding of Target Heart Rate Range (THRR);Understanding of Exercise Prescription Increase Physical Activity;Increase Strength and Stamina;Able to understand and use rate of perceived exertion (RPE) scale;Knowledge and understanding of Target Heart Rate Range (THRR);Understanding of Exercise Prescription     Comments Pt first day in program, pt exercised for 30 minutes averging 2.9 Mets w/o abnormal s/s. Pt was educated on RPE scale and will continue to educate pt on his ExRx, THRR, and RPE scale while increasing his WL as tolerated. Reviewed METs and goals. Pt making slow progress. Pt voices he has seen some increase in his strength and stamina. He alos states that he sees that his blood pressure has improved. Reviewed METs and goals. Pt continues to make slow porgress.  Voices he has less SOB. He voices increase in strength and stamina.     Expected Outcomes Pt is very motivated to reach his goals, will continue to educate pt on RPE, THRR, ExRx, and importance of exercise. Will continue to monitor patient and progress exercise workloads as tolerated. Will continue to monitor patient and progress exercise workloads as tolerated.              Nutrition & Weight - Outcomes:  Pre Biometrics - 11/11/22 1340       Pre Biometrics   Waist Circumference 49 inches    Hip Circumference 47.5 inches    Waist to Hip Ratio 1.03 %    Triceps Skinfold 35 mm    % Body Fat 38.9 %    Grip Strength 28 kg    Flexibility 0 in   could not reach   Single Leg Stand 5.12 seconds             Post  Biometrics - 01/15/23 1300        Post  Biometrics   Height 5' 6.5" (1.689 m)    Weight 99.7 kg    Waist Circumference 48 inches    Hip Circumference 47 inches    Waist to Hip Ratio 1.02 %    BMI (Calculated) 34.95    Triceps Skinfold 34 mm    % Body Fat 38 %    Grip Strength 34 kg    Flexibility 0 in    Single Leg Stand 5.08 seconds             Nutrition:  Nutrition Therapy & Goals - 01/15/23 1336       Nutrition Therapy   Diet Heart Healthy/Carbohydrate Consistent Diet    Drug/Food Interactions Statins/Certain Fruits      Personal Nutrition Goals   Nutrition Goal Patient to identify strategies for reducing cardiovascular risk by attending the [employer] education and nutrition series weekly.    Personal Goal #2 Patient to improve diet quality by using the plate method as a guide for meal planning to include lean protein/plant protein, fruits, vegetables, whole grains, nonfat dairy as part of a well-balanced diet.    Personal Goal #3 Patient to limit to 1500mg  of sodium per day    Personal Goal #4 Patient to identify food sources of saturated fat, trans fat, sodium, and refined carbohydrates.    Comments Goals in action. Raiford Noble continues to attend the [employer] and nutrition series. He and his wife have started making many dietary changes. He was recently diagnosed with DM2 at PCP appointment on 11/05/2022; no diabetes medications were added. He is down 5.3# since starting with our program. Raiford Noble will benefit from participation in intensive cardiac rehab for nutrition, exercise, and lifestyle modification.      Intervention Plan   Intervention Prescribe, educate and counsel regarding individualized specific dietary modifications aiming towards targeted core components such as weight, hypertension, lipid management, diabetes, heart failure and other comorbidities.;Nutrition handout(s) given to patient.    Expected Outcomes Short Term Goal: Understand basic principles of  dietary content, such as calories, fat, sodium, cholesterol and nutrients.;Long Term Goal: Adherence to prescribed nutrition plan.             Nutrition Discharge:  Nutrition Assessments - 01/28/23 0933       Rate Your Plate Scores   Post Score 66             Education Questionnaire Score:  Knowledge Questionnaire Score - 01/13/23  1600       Knowledge Questionnaire Score   Post Score 22/24             Goals reviewed with patient; copy given to patient. Pt graduates from  Intensive/Traditional cardiac rehab program 01/15/23  with completion of  46 exercise and education sessions. Pt maintained good attendance and progressed nicely during their participation in rehab as evidenced by increased MET level.  Pt has made significant lifestyle changes and should be commended for their success. Raiford Noble  achieved their goals during cardiac rehab. Raiford Noble lost 2.4 kg while participating in cardiac rehab.Thayer Headings RN BSN

## 2023-02-19 ENCOUNTER — Other Ambulatory Visit: Payer: Self-pay

## 2023-02-19 DIAGNOSIS — E785 Hyperlipidemia, unspecified: Secondary | ICD-10-CM

## 2023-02-19 DIAGNOSIS — I2510 Atherosclerotic heart disease of native coronary artery without angina pectoris: Secondary | ICD-10-CM | POA: Diagnosis not present

## 2023-02-19 DIAGNOSIS — I2583 Coronary atherosclerosis due to lipid rich plaque: Secondary | ICD-10-CM | POA: Diagnosis not present

## 2023-02-20 LAB — LIPID PANEL
Chol/HDL Ratio: 1.6 ratio (ref 0.0–5.0)
Cholesterol, Total: 91 mg/dL — ABNORMAL LOW (ref 100–199)
HDL: 56 mg/dL (ref >39)
LDL Chol Calc (NIH): 18 mg/dL (ref 0–99)
Triglycerides: 83 mg/dL (ref 0–149)
VLDL Cholesterol Cal: 17 mg/dL (ref 5–40)

## 2023-04-09 DIAGNOSIS — H5203 Hypermetropia, bilateral: Secondary | ICD-10-CM | POA: Diagnosis not present

## 2023-04-09 DIAGNOSIS — H02831 Dermatochalasis of right upper eyelid: Secondary | ICD-10-CM | POA: Diagnosis not present

## 2023-04-09 DIAGNOSIS — H31002 Unspecified chorioretinal scars, left eye: Secondary | ICD-10-CM | POA: Diagnosis not present

## 2023-04-09 DIAGNOSIS — H02834 Dermatochalasis of left upper eyelid: Secondary | ICD-10-CM | POA: Diagnosis not present

## 2023-04-09 DIAGNOSIS — D3132 Benign neoplasm of left choroid: Secondary | ICD-10-CM | POA: Diagnosis not present

## 2023-05-11 ENCOUNTER — Telehealth: Payer: Self-pay

## 2023-05-11 NOTE — Telephone Encounter (Signed)
Pre-operative Risk Assessment    Patient Name: Bryan Hays  DOB: Mar 22, 1946 MRN: [id number]   LAST OFFICE VISIT: 01/12/23 WITH DR. CRENSHAW NEXT OFFICE VISIT:  07/12/23 WITH CALLIE GOODWICH, PA   Request for Surgical Clearance    Procedure:  Dental Extraction - Amount of Teeth to be Pulled:  1  Date of Surgery:  Clearance TBD                                 Surgeon:  Dr. Kris Mouton Surgeon's Group or Practice Name:  [facility]  Phone number:  [phone] Fax number:  [phone]   Type of Clearance Requested:   - Pharmacy:  Hold Aspirin and Clopidogrel (Plavix) WHEN TO HOLD   Type of Anesthesia:   Lidocaine    Additional requests/questions:    Signed, Michaelle Copas   05/11/2023, 1:09 PM

## 2023-05-11 NOTE — Telephone Encounter (Signed)
Patient Name: Bryan Hays  DOB: 24-Feb-1946 MRN: [id number]  Primary Cardiologist: Olga Millers, MD  Chart reviewed as part of pre-operative protocol coverage.   IF SIMPLE EXTRACTION/CLEANINGS: Simple dental extractions (i.e. 1-2 teeth) are considered low risk procedures per guidelines and generally do not require any specific cardiac clearance. It is also generally accepted that for simple extractions and dental cleanings, there is no need to interrupt blood thinner therapy.  SBE prophylaxis is not required for the patient from a cardiac standpoint.  I will route this recommendation to the requesting party via Epic fax function and remove from pre-op pool.  Please call with questions.  Sharlene Dory, PA-C 05/11/2023, 2:01 PM

## 2023-06-02 LAB — HEMOGLOBIN A1C: A1c: 6.3

## 2023-06-03 LAB — BASIC METABOLIC PANEL: EGFR: 94

## 2023-06-29 NOTE — Progress Notes (Addendum)
Cardiology Office Note:    Date:  07/12/2023   ID:  Bryan Hays, DOB [birth year], MRN [id number]  PCP:  Emilio Aspen, MD  Cardiologist:  Olga Millers, MD     Referring MD: Emilio Aspen, *   Chief Complaint: follow-up of CAD  History of Present Illness:    Bryan Hays is a 77 y.o. male with a history of CAD s/p DES to LAD in 09/2022, hypertension, hyperlipidemia, GERD, and nephrolithiasis who is followed by Dr. Jens Hays and presents today for routine follow-up of CAD.   Patient was referred to Dr. Jens Hays in 09/2022 for further evaluation of an elevated calcium score and for pre-op evaluation for upcoming trigger finger release. Prior ETT in 06/2022 was negative for ischemia. Coronary calcium score in 08/2022 was severely elevated at 3,834 (96th percentile for age and sex) involving the left main, LAD< RCA, and LCX. At that initial visit with Dr. Jens Hays, patient reported progressive dyspnea on exertion over the last year as well as chest tightness at peak activities. Cardiac catheterization was recommended and showed 60% stenosis of proximal to mid LAD (with RFR of 0.89), 80% stenosis of 1st Diag, tandem 80% and 90% stenoses of RPAV, and 60% stenosis of OM1. He underwent successful PCI with orbital atherectomy, shockwave lithotripsy, and DES to LAD. 1st Diag and RPAV branches were small vessels and medical therapy was recommended for these lesions.  He was last seen by Dr. Genice Rouge in 12/2022 at which time  he reported improvement in his dyspnea on exertion since PCI and no chest pain.   Patient presents today for follow-up. Here alone. He continues to have some shortness of breath but only with more strenuous activities. Overall, shortness of breath is much improved. No dyspnea with routine activities. He denies any chest pain. No orthopnea, PND, or edema. No palpitations, lightheadedness, dizziness, or syncope. He mentions that he is likely going need multiple procedures  in the future including removal of a bunion, trigger finer release, repair of torn meniscus in knee, and possible hip surgery. He is able to complete >4.0 METs without any chest pain or significant shortness of breath.   EKGs/Labs/Other Studies Reviewed:    The following studies were reviewed:  Left Cardiac Catheterization 10/15/2022:   Prox LAD to Mid LAD lesion is 60% stenosed.   1st Diag lesion is 80% stenosed.   RPAV-1 lesion is 80% stenosed.   RPAV-2 lesion is 90% stenosed.   1st Mrg lesion is 60% stenosed.   A stent was successfully placed.   Post intervention, there is a 0% residual stenosis.   1.  High-grade proximal to mid LAD lesion with associated RFR of 0.89.  Given the patient's dyspnea despite medical therapy intervention was pursued with OCT guidance, orbital atherectomy, shockwave lithotripsy, and implantation of 1 drug-eluting stent. 2.  High-grade disease of small first diagonal and distal R PLV branch; these in conjunction subtenon a small myocardial area. 3.  Moderate obtuse marginal disease. 4.  LVEDP of 9 mmHg.   Recommendation: Dual antiplatelet therapy for preferably 6 months with aspirin and Plavix and then Plavix monotherapy indefinitely.  Diagnostic Dominance: Right  Intervention       EKG:  EKG not ordered today.   Recent Labs: 10/16/2022: BUN 11; Creatinine, Ser 0.69; Hemoglobin 13.4; Platelets 190; Potassium 3.8; Sodium 137  Recent Lipid Panel    Component Value Date/Time   CHOL 91 (L) 02/19/2023 0827   TRIG 83 02/19/2023 0827   HDL  56 02/19/2023 0827   CHOLHDL 1.6 02/19/2023 0827   CHOLHDL 4 03/16/2013 1112   VLDL 17.0 03/16/2013 1112   LDLCALC 18 02/19/2023 0827    Physical Exam:    Vital Signs: BP 130/64 (BP Location: Left Arm, Patient Position: Sitting, Cuff Size: Normal)   Pulse 64   Ht 5\' 6"  (1.676 m)   Wt 215 lb 12.8 oz (97.9 kg)   SpO2 96%   BMI 34.83 kg/m     Wt Readings from Last 3 Encounters:  07/12/23 215 lb 12.8 oz  (97.9 kg)  01/15/23 219 lb 12.8 oz (99.7 kg)  01/12/23 220 lb (99.8 kg)     General: 77 y.o. Caucasian male in no acute distress. HEENT: Normocephalic and atraumatic. Sclera clear.  Neck: Supple. No carotid bruits. No JVD. Heart:  RRR. Distinct S1 and S2. No murmurs, gallops, or rubs.  Lungs: No increased work of breathing. Clear to ausculation bilaterally. No wheezes, rhonchi, or rales.  Abdomen: Soft, non-distended, and non-tender to palpation.  Extremities: No lower extremity edema.  Skin: Warm and dry. Neuro: No focal deficits. Psych: Normal affect. Responds appropriately.   Assessment:    1. Coronary artery disease involving native coronary artery of native heart without angina pectoris   2. Primary hypertension   3. Hyperlipidemia, unspecified hyperlipidemia type   4. Pre-op evaluation     Plan:    CAD S/p orbital atherectomy, shockwave lithotripsy, and DES to mid LAD in 09/2022. LHC at that time also showed significant disease in small 1st Diag and RPAV which were treated medically.  - No chest pain. He continues to have some dyspnea with more strenuous exercise but this is much improved since PCI.  - Currently on DAPT with Aspirin 81mg  daily and Plavix 75mg  daily. He is almost 9 months out from PCI; therefore, will discuss with MD about whether Aspirin or Plavix can be stopped.  - Continue beta-blocker and statin.   Hypertension BP well controlled.  - Continue current medications: Lisinopril 5mg  daily and Toprol-XL 25mg  daily.   Hyperlipidemia Lipid panel in 01/2023: Total Cholesterol 80, Triglycerides 63, HDL 50, LDL 15. LDL goal <70 given CAD.  - Continue Crestor 40mg  daily and Repatha.  - Patient expressed concern that he is about to enter donut hole with Repatha. He asks about patient assistance. Will reach out to our Pharmacy team about this.   Pre-Op Evaluation Patient mentions need for multiple potential procedures in the future including bunion removal,  trigger finger release, repair of meniscus tear, and possible hip surgery. He is doing well from a cardiac standpoint and is almost 9 months out from elective PCI. He is able to complete >4.0 METs of physical activity. He would be okay to proceed with any of these procedures at this time. However, asked him to have clearance forms sent to [place].   Disposition: Follow up in 6 months.   Signed, Corrin Parker, PA-C  07/12/2023 11:49 PM    [facility]  ADDENDUM 07/19/2023: Dr. Lynnette Caffey, who performed cardiac catheterization, recommended DAPT for 6 months after recent PCI in 09/2022 and then Plavix monotherapy indefinitely. Discussed this with Dr. Jens Hays who agreed with this. Notify patient of this recommendation via MyChart. Will remove Aspirin from medication list.  Corrin Parker, PA-C 07/19/2023 7:39 PM

## 2023-07-08 DIAGNOSIS — E882 Lipomatosis, not elsewhere classified: Secondary | ICD-10-CM | POA: Diagnosis not present

## 2023-07-08 DIAGNOSIS — L578 Other skin changes due to chronic exposure to nonionizing radiation: Secondary | ICD-10-CM | POA: Diagnosis not present

## 2023-07-08 DIAGNOSIS — L820 Inflamed seborrheic keratosis: Secondary | ICD-10-CM | POA: Diagnosis not present

## 2023-07-08 DIAGNOSIS — D225 Melanocytic nevi of trunk: Secondary | ICD-10-CM | POA: Diagnosis not present

## 2023-07-08 DIAGNOSIS — L814 Other melanin hyperpigmentation: Secondary | ICD-10-CM | POA: Diagnosis not present

## 2023-07-08 DIAGNOSIS — L821 Other seborrheic keratosis: Secondary | ICD-10-CM | POA: Diagnosis not present

## 2023-07-12 ENCOUNTER — Ambulatory Visit: Payer: Medicare HMO | Attending: Student | Admitting: Student

## 2023-07-12 ENCOUNTER — Encounter: Payer: Self-pay | Admitting: Student

## 2023-07-12 VITALS — BP 130/64 | HR 64 | Ht 66.0 in | Wt 215.8 lb

## 2023-07-12 DIAGNOSIS — I10 Essential (primary) hypertension: Secondary | ICD-10-CM | POA: Diagnosis not present

## 2023-07-12 DIAGNOSIS — E785 Hyperlipidemia, unspecified: Secondary | ICD-10-CM

## 2023-07-12 DIAGNOSIS — I2510 Atherosclerotic heart disease of native coronary artery without angina pectoris: Secondary | ICD-10-CM | POA: Diagnosis not present

## 2023-07-12 DIAGNOSIS — Z01818 Encounter for other preprocedural examination: Secondary | ICD-10-CM | POA: Diagnosis not present

## 2023-07-12 NOTE — Patient Instructions (Signed)
Medication Instructions:  No changes at this time. *If you need a refill on your cardiac medications before your next appointment, please call your pharmacy*   Lab Work: No labs   Testing/Procedures: No testing   Follow-Up: At [facility], you and your health needs are our priority.  As part of our continuing mission to provide you with exceptional heart care, we have created designated Provider Care Teams.  These Care Teams include your primary Cardiologist (physician) and Advanced Practice Providers (APPs -  Physician Assistants and Nurse Practitioners) who all work together to provide you with the care you need, when you need it.  We recommend signing up for the patient portal called "MyChart".  Sign up information is provided on this After Visit Summary.  MyChart is used to connect with patients for Virtual Visits (Telemedicine).  Patients are able to view lab/test results, encounter notes, upcoming appointments, etc.  Non-urgent messages can be sent to your provider as well.   To learn more about what you can do with MyChart, go to [email or web address].    Your next appointment:   6 month(s)  Provider:   Olga Millers, MD  or Marjie Skiff, PA-C

## 2023-07-19 NOTE — Addendum Note (Signed)
Addended by: Marjie Skiff on: 07/19/2023 07:40 PM   Modules accepted: Orders

## 2023-07-20 NOTE — Telephone Encounter (Signed)
Called and spoke with pt/wife; pt states that he "makes too much money" and manufacturer will not pay for any of his expense. So I will not do pt assistance at this time. Pt will "let me know" if/when he will need this.

## 2023-09-17 ENCOUNTER — Other Ambulatory Visit: Payer: Self-pay | Admitting: Cardiology

## 2023-09-17 DIAGNOSIS — R072 Precordial pain: Secondary | ICD-10-CM

## 2023-09-17 DIAGNOSIS — E7800 Pure hypercholesterolemia, unspecified: Secondary | ICD-10-CM

## 2023-09-26 ENCOUNTER — Encounter (HOSPITAL_BASED_OUTPATIENT_CLINIC_OR_DEPARTMENT_OTHER): Payer: Self-pay

## 2023-09-26 ENCOUNTER — Other Ambulatory Visit: Payer: Self-pay

## 2023-09-26 ENCOUNTER — Emergency Department (HOSPITAL_BASED_OUTPATIENT_CLINIC_OR_DEPARTMENT_OTHER)
Admission: EM | Admit: 2023-09-26 | Discharge: 2023-09-26 | Disposition: A | Discharge status: Home / Self Care | Payer: Medicare HMO | Attending: Emergency Medicine | Admitting: Emergency Medicine

## 2023-09-26 DIAGNOSIS — I2510 Atherosclerotic heart disease of native coronary artery without angina pectoris: Secondary | ICD-10-CM | POA: Diagnosis not present

## 2023-09-26 DIAGNOSIS — Z7902 Long term (current) use of antithrombotics/antiplatelets: Secondary | ICD-10-CM | POA: Diagnosis not present

## 2023-09-26 DIAGNOSIS — R509 Fever, unspecified: Secondary | ICD-10-CM | POA: Diagnosis present

## 2023-09-26 DIAGNOSIS — J101 Influenza due to other identified influenza virus with other respiratory manifestations: Secondary | ICD-10-CM | POA: Insufficient documentation

## 2023-09-26 DIAGNOSIS — Z20822 Contact with and (suspected) exposure to covid-19: Secondary | ICD-10-CM | POA: Insufficient documentation

## 2023-09-26 LAB — CBC
HCT: 42.2 % (ref 39.0–52.0)
Hemoglobin: 14.3 g/dL (ref 13.0–17.0)
MCH: 30.8 pg (ref 26.0–34.0)
MCHC: 33.9 g/dL (ref 30.0–36.0)
MCV: 90.8 fL (ref 80.0–100.0)
Platelets: 170 10*3/uL (ref 150–400)
RBC: 4.65 MIL/uL (ref 4.22–5.81)
RDW: 14.1 % (ref 11.5–15.5)
WBC: 6.7 10*3/uL (ref 4.0–10.5)
nRBC: 0 % (ref 0.0–0.2)

## 2023-09-26 LAB — COMPREHENSIVE METABOLIC PANEL
ALT: 25 U/L (ref 0–44)
AST: 31 U/L (ref 15–41)
Albumin: 3.6 g/dL (ref 3.5–5.0)
Alkaline Phosphatase: 57 U/L (ref 38–126)
Anion gap: 13 (ref 5–15)
BUN: 16 mg/dL (ref 8–23)
CO2: 21 mmol/L — ABNORMAL LOW (ref 22–32)
Calcium: 8.6 mg/dL — ABNORMAL LOW (ref 8.9–10.3)
Chloride: 102 mmol/L (ref 98–111)
Creatinine, Ser: 0.83 mg/dL (ref 0.61–1.24)
GFR, Estimated: >60 mL/min (ref >60)
Glucose, Bld: 154 mg/dL — ABNORMAL HIGH (ref 70–99)
Potassium: 3.6 mmol/L (ref 3.5–5.1)
Sodium: 136 mmol/L (ref 135–145)
Total Bilirubin: 1.6 mg/dL — ABNORMAL HIGH (ref 0.0–1.2)
Total Protein: 7.3 g/dL (ref 6.5–8.1)

## 2023-09-26 LAB — RESP PANEL BY RT-PCR (RSV, FLU A&B, COVID)  RVPGX2
Influenza A by PCR: POSITIVE — AB
Influenza B by PCR: NEGATIVE
Resp Syncytial Virus by PCR: NEGATIVE
SARS Coronavirus 2 by RT PCR: NEGATIVE

## 2023-09-26 LAB — LIPASE, BLOOD: Lipase: 22 U/L (ref 11–51)

## 2023-09-26 MED ORDER — ONDANSETRON 8 MG PO TBDP: ORAL_TABLET | ORAL | 0 refills | Status: DC

## 2023-09-26 MED ORDER — ONDANSETRON 4 MG PO TBDP
4.0000 mg | ORAL_TABLET | Freq: Once | ORAL | Status: AC
Start: 2023-09-26 — End: 2023-09-26
  Administered 2023-09-26: 4 mg via ORAL
  Filled 2023-09-26: qty 1

## 2023-09-26 NOTE — Discharge Instructions (Addendum)
Begin taking Zofran as prescribed as needed for nausea.  Drink plenty of fluids and get plenty of rest.  Take Tylenol 1000 mg rotated with Motrin 600 mg every 4 hours as needed for pain or fever.  Take over-the-counter medications as needed for relief of symptoms.

## 2023-09-26 NOTE — ED Triage Notes (Addendum)
Patient arrives POV from home c/o cough and fever that started on Thursday; patient reports nausea and vomiting started yesterday. Reports no known exposure to illness. Wife states highest fever 101 F. Patient a&o x4; patient reports decreased appetite; vomited x1 in triage.

## 2023-09-26 NOTE — ED Provider Notes (Signed)
[place] [facility] AT [facility] Provider Note   CSN: [id number] Arrival date & time: 09/26/23  0037     History  Chief Complaint  Patient presents with   Cough   Emesis    Bryan Hays is a 78 y.o. male.  Patient is a 78 year old male with past medical history of coronary artery disease with stent.  Patient presenting today complaining of 5 days of bodyaches, fever, cough, and feeling generally unwell.  This evening, he had several episodes of vomiting, but no diarrhea.  He denies any abdominal pain.  He denies any ill contacts.  He was given a Phenergan by a family member which did seem to help.    The history is provided by the patient.       Home Medications Prior to Admission medications   Medication Sig Start Date End Date Taking? Authorizing Provider  acetaminophen (TYLENOL) 650 MG CR tablet Take 650 mg by mouth every 8 (eight) hours as needed for pain.    [provider]  cholecalciferol (VITAMIN D3) 25 MCG (1000 UNIT) tablet Take 1,000 Units by mouth daily.    [provider]  clopidogrel (PLAVIX) 75 MG tablet Take 1 tablet (75 mg total) by mouth daily with breakfast. 01/12/23   Lewayne Bunting, MD  cyanocobalamin (VITAMIN B12) 1000 MCG tablet Take 1,000 mcg by mouth daily.    [provider]  Evolocumab (REPATHA SURECLICK) 140 MG/ML SOAJ Inject 140 mg into the skin every 14 (fourteen) days. 12/02/22   Pavero, Cristal Deer, RPH  gabapentin (NEURONTIN) 300 MG capsule Take 300 mg by mouth at bedtime. 11/05/22   [provider]  lisinopril (PRINIVIL,ZESTRIL) 5 MG tablet Take 5 mg by mouth daily. 07/01/16   [provider]  metoprolol succinate (TOPROL-XL) 25 MG 24 hr tablet TAKE ONE TABLET BY MOUTH ONE TIME DAILY 09/17/23   Corrin Parker, PA-C  neomycin-polymyxin-dexamethasone (MAXITROL) 0.1 % ophthalmic suspension Place 1 drop into both eyes daily as needed (irritation).    [provider]   nitroGLYCERIN (NITROSTAT) 0.4 MG SL tablet Place 1 tablet (0.4 mg total) under the tongue every 5 (five) minutes as needed for chest pain. 10/16/22 10/16/23  Marcelino Duster, PA  pantoprazole (PROTONIX) 40 MG tablet Take 1 tablet (40 mg total) by mouth daily. 01/12/23 01/12/24  Lewayne Bunting, MD  Phenylephrine-APAP-guaiFENesin (MUCINEX FAST-MAX) [phone] MG/20ML LIQD Take 20 mLs by mouth daily as needed (congestion).    [provider]  rosuvastatin (CRESTOR) 40 MG tablet TAKE ONE TABLET BY MOUTH ONE TIME DAILY 09/17/23   Marjie Skiff E, PA-C      Allergies    Banana and Sulfonamide derivatives    Review of Systems   Review of Systems  All other systems reviewed and are negative.   Physical Exam Updated Vital Signs BP 134/71 (BP Location: Right Arm)   Pulse 92   Temp 98.7 F (37.1 C) (Oral)   Resp 17   Ht 5' 6.5" (1.689 m)   Wt 98.9 kg   SpO2 95%   BMI 34.66 kg/m  Physical Exam Vitals and nursing note reviewed.  Constitutional:      General: He is not in acute distress.    Appearance: He is well-developed. He is not diaphoretic.  HENT:     Head: Normocephalic and atraumatic.  Cardiovascular:     Rate and Rhythm: Normal rate and regular rhythm.     Heart sounds: No murmur heard.  No friction rub.  Pulmonary:     Effort: Pulmonary effort is normal. No respiratory distress.     Breath sounds: Normal breath sounds. No wheezing or rales.  Abdominal:     General: Bowel sounds are normal. There is no distension.     Palpations: Abdomen is soft.     Tenderness: There is no abdominal tenderness.  Musculoskeletal:        General: Normal range of motion.     Cervical back: Normal range of motion and neck supple.  Skin:    General: Skin is warm and dry.  Neurological:     Mental Status: He is alert and oriented to person, place, and time.     Coordination: Coordination normal.     ED Results / Procedures / Treatments   Labs (all labs ordered are  listed, but only abnormal results are displayed) Labs Reviewed  RESP PANEL BY RT-PCR (RSV, FLU A&B, COVID)  RVPGX2 - Abnormal; Notable for the following components:      Result Value   Influenza A by PCR POSITIVE (*)    All other components within normal limits  COMPREHENSIVE METABOLIC PANEL - Abnormal; Notable for the following components:   CO2 21 (*)    Glucose, Bld 154 (*)    Calcium 8.6 (*)    Total Bilirubin 1.6 (*)    All other components within normal limits  LIPASE, BLOOD  CBC  URINALYSIS, ROUTINE W REFLEX MICROSCOPIC    EKG None  Radiology No results found.  Procedures Procedures    Medications Ordered in ED Medications  ondansetron (ZOFRAN-ODT) disintegrating tablet 4 mg (4 mg Oral Given 09/26/23 0105)    ED Course/ Medical Decision Making/ A&P  Patient is a 78 year old male presenting with fever, body aches, cough, vomiting as described in the HPI.  Patient arrives with stable vital signs and is afebrile.  There is no hypoxia.  Abdominal exam is benign.  Laboratory studies obtained including CBC, CMP, and lipase, all of which are basically unremarkable.  Influenza A test is positive.  Patient to be discharged with ODT Zofran, Tylenol/Motrin, and return as needed.  Final Clinical Impression(s) / ED Diagnoses Final diagnoses:  None    Rx / DC Orders ED Discharge Orders     None         Geoffery Lyons, MD 09/26/23 0430

## 2023-10-08 ENCOUNTER — Telehealth: Payer: Self-pay | Admitting: Pharmacist

## 2023-10-08 NOTE — Telephone Encounter (Signed)
Patient left VM that copay on Repatha went up. Called patient back. Enrolled in [facility] grant and sent info over [employer]

## 2023-11-09 DIAGNOSIS — Z0000 Encounter for general adult medical examination without abnormal findings: Secondary | ICD-10-CM | POA: Diagnosis not present

## 2023-11-09 DIAGNOSIS — Z1331 Encounter for screening for depression: Secondary | ICD-10-CM | POA: Diagnosis not present

## 2023-12-16 ENCOUNTER — Other Ambulatory Visit: Payer: Self-pay | Admitting: Cardiology

## 2023-12-16 ENCOUNTER — Telehealth: Payer: Self-pay | Admitting: Cardiology

## 2023-12-16 MED ORDER — CLOPIDOGREL BISULFATE 75 MG PO TABS: 75.0000 mg | ORAL_TABLET | Freq: Every day | ORAL | 3 refills | Status: AC

## 2023-12-16 NOTE — Telephone Encounter (Signed)
 Pt c/o medication issue:  1. Name of Medication:   clopidogrel  (PLAVIX ) 75 MG tablet    2. How are you currently taking this medication (dosage and times per day)? As written   3. Are you having a reaction (difficulty breathing--STAT)? No   4. What is your medication issue? Pharmacy called in stating they received this med but they need to clarify why 390 tablets were sent. Please advise.

## 2023-12-16 NOTE — Telephone Encounter (Signed)
 Called and spoke to patient and Melissa, Pharmacy tech at [facility] and clarify number of tablets and refills .Made pharmacy aware that Plavix  should be  90 tablets with 3 refills. Understanding verbalized.

## 2024-01-07 DIAGNOSIS — M4317 Spondylolisthesis, lumbosacral region: Secondary | ICD-10-CM | POA: Diagnosis not present

## 2024-01-07 DIAGNOSIS — M47816 Spondylosis without myelopathy or radiculopathy, lumbar region: Secondary | ICD-10-CM | POA: Diagnosis not present

## 2024-01-07 DIAGNOSIS — Z6834 Body mass index (BMI) 34.0-34.9, adult: Secondary | ICD-10-CM | POA: Diagnosis not present

## 2024-01-10 ENCOUNTER — Other Ambulatory Visit: Payer: Self-pay | Admitting: Pharmacist

## 2024-01-10 DIAGNOSIS — I2510 Atherosclerotic heart disease of native coronary artery without angina pectoris: Secondary | ICD-10-CM

## 2024-01-10 DIAGNOSIS — E785 Hyperlipidemia, unspecified: Secondary | ICD-10-CM

## 2024-01-12 NOTE — Progress Notes (Signed)
 Cardiology Office Note:    Date:  01/24/2024   ID:  DUPREE GIVLER, DOB 1946-02-24, MRN [id number]  PCP:  Benedetta Bradley, MD  Cardiologist:  Alexandria Angel, MD     Referring MD: Benedetta Bradley, *   Chief Complaint: follow-up of CAD  History of Present Illness:    Bryan Hays is a 78 y.o. male with a history of CAD s/p DES to LAD in 09/2022, hypertension, hyperlipidemia, prediabetes, GERD, and nephrolithiasis who is followed by Dr. Audery Blazing and presents today for routine follow-up of CAD.   Patient was referred to Dr. Audery Blazing in 09/2022 for further evaluation of an elevated calcium  score and for pre-op evaluation for upcoming trigger finger release. Prior ETT in 06/2022 was negative for ischemia. Coronary calcium  score in 08/2022 was severely elevated at 3,834 (96th percentile for age and sex) involving the left main, LAD< RCA, and LCX. At that initial visit with Dr. Audery Blazing, patient reported progressive dyspnea on exertion over the last year as well as chest tightness at peak activities. Cardiac catheterization was recommended and showed 60% stenosis of proximal to mid LAD (with RFR of 0.89), 80% stenosis of 1st Diag, tandem 80% and 90% stenoses of RPAV, and 60% stenosis of OM1. He underwent successful PCI with orbital atherectomy, shockwave lithotripsy, and DES to LAD. 1st Diag and RPAV branches were small vessels and medical therapy was recommended for these lesions.   He was last seen by me in 06/2023 at which time he reported continued shortness of breath with more strenuous activities but stated this had overall improved. He was otherwise, doing well form a cardiac standpoint.   Patient presents today for follow-up. He has been doing well since last visit. He continues to have shortness of breath with more strenuous exercise but this is unchanged since last visit. No shortness of breath with routine activities or at rest. Overall, his shortness of breath has improved since his  PCI. No chest pain, orthopnea, PND, edema, palpitations, lightheadedness, dizziness, or syncope. No abnormal bleeding on Plavix .  He is on Repatha  and has had significant improvement in his LDL on this.  Last lipid panel in 3/25 showed his LDL was 0.  However, the last time he went to go get his Repatha  filled at the pharmacy, he was told that our office had denied this.  I do not see any documentation of this.  Will reach out to pharmacy team for assistance.  His PCP started him on Ozempic in March.  He was initially started on the 0.25 mg dosing.  He was increased to the 0.5 mg dosing after about a month but was not able to tolerate this due to GI symptoms.  PCP recommended continuing the 0.25 mg dosing for a little longer and then trying the higher dose again.  EKGs/Labs/Other Studies Reviewed:    The following studies were reviewed:  Left Cardiac Catheterization 10/15/2022:   Prox LAD to Mid LAD lesion is 60% stenosed.   1st Diag lesion is 80% stenosed.   RPAV-1 lesion is 80% stenosed.   RPAV-2 lesion is 90% stenosed.   1st Mrg lesion is 60% stenosed.   A stent was successfully placed.   Post intervention, there is a 0% residual stenosis.   1.  High-grade proximal to mid LAD lesion with associated RFR of 0.89.  Given the patient's dyspnea despite medical therapy intervention was pursued with OCT guidance, orbital atherectomy, shockwave lithotripsy, and implantation of 1 drug-eluting stent. 2.  High-grade disease of small first diagonal and distal R PLV branch; these in conjunction subtenon a small myocardial area. 3.  Moderate obtuse marginal disease. 4.  LVEDP of 9 mmHg.   Recommendation: Dual antiplatelet therapy for preferably 6 months with aspirin  and Plavix  and then Plavix  monotherapy indefinitely.   Diagnostic Dominance: Right  Intervention         EKG:  EKG ordered today.  EKG Interpretation Date/Time:  Monday January 24 2024 09:15:26 EDT Ventricular Rate:  59 PR  Interval:  152 QRS Duration:  84 QT Interval:  422 QTC Calculation: 417 R Axis:   -22  Text Interpretation: Sinus bradycardia When compared with ECG of 16-Oct-2022 05:46, No significant change was found Confirmed by Sharren Decree [phone]) on 01/24/2024 9:29:22 AM     Recent Labs: 09/26/2023: ALT 25; BUN 16; Creatinine, Ser 0.83; Hemoglobin 14.3; Platelets 170; Potassium 3.6; Sodium 136  Recent Lipid Panel    Component Value Date/Time   CHOL 91 (L) 02/19/2023 0827   TRIG 83 02/19/2023 0827   HDL 56 02/19/2023 0827   CHOLHDL 1.6 02/19/2023 0827   CHOLHDL 4 03/16/2013 1112   VLDL 17.0 03/16/2013 1112   LDLCALC 18 02/19/2023 0827    Physical Exam:    Vital Signs: BP 124/70 (BP Location: Left Arm, Patient Position: Sitting)   Pulse (!) 58   Ht 5' 6.5" (1.689 m)   Wt 216 lb (98 kg)   SpO2 98%   BMI 34.34 kg/m     Wt Readings from Last 3 Encounters:  01/24/24 216 lb (98 kg)  09/26/23 218 lb (98.9 kg)  07/12/23 215 lb 12.8 oz (97.9 kg)     General: 78 y.o. Caucasian male in no acute distress. HEENT: Normocephalic and atraumatic. Sclera clear.  Neck: Supple. No carotid bruits. No JVD. Heart: RRR. Distinct S1 and S2. No murmurs, gallops, or rubs.  Lungs: No increased work of breathing. Clear to ausculation bilaterally. No wheezes, rhonchi, or rales.  Extremities: No lower extremity edema.  Skin: Warm and dry. Neuro: No focal deficits. Psych: Normal affect. Responds appropriately.  Assessment:    1. Coronary artery disease involving native coronary artery of native heart without angina pectoris   2. Hypertension, unspecified type   3. Hyperlipidemia LDL goal <70   4. Prediabetes     Plan:    CAD S/p orbital atherectomy, shockwave lithotripsy, and DES to mid LAD in 09/2022. LHC at that time also showed significant disease in small 1st Diag and RPAV which were treated medically.  - No chest pain. He continues to have some dyspnea with more strenuous exercise but this is  much improved since PCI and is stable since last visit. - Continue Plavix  monotherapy and statin.    Hypertension BP well controlled.  - Continue current medications: Lisinopril  5mg  daily and Toprol -XL 25mg  daily.    Hyperlipidemia Lipid panel in 10/2023: Total Cholesterol 79, Triglycerides 89, HDL 62, LDL 0. LDL goal <70 given CAD.  - Continue Crestor  40mg  daily and Repatha .  - He went to get his Repatha  refilled at his outpatient pharmacy a couple weeks ago and was told that our office had "denied" a refill of this.  I do not see any documentation of this.  I suspect this was just a misunderstanding.  I will reach out to Chris Pavero, PharmD, for assistance.   Prediabetes Hemoglobin A1c 6.4% in 10/2023. - Recently started on Ozempic by PCP.  Disposition: Follow up in 6 months.    Signed,  Maudene Stotler E Helma Argyle, PA-C  01/24/2024 10:30 AM    [facility]

## 2024-01-24 ENCOUNTER — Ambulatory Visit: Attending: Student | Admitting: Student

## 2024-01-24 ENCOUNTER — Encounter: Payer: Self-pay | Admitting: Student

## 2024-01-24 VITALS — BP 124/70 | HR 58 | Ht 66.5 in | Wt 216.0 lb

## 2024-01-24 DIAGNOSIS — R7303 Prediabetes: Secondary | ICD-10-CM

## 2024-01-24 DIAGNOSIS — E785 Hyperlipidemia, unspecified: Secondary | ICD-10-CM

## 2024-01-24 DIAGNOSIS — I2510 Atherosclerotic heart disease of native coronary artery without angina pectoris: Secondary | ICD-10-CM

## 2024-01-24 DIAGNOSIS — I10 Essential (primary) hypertension: Secondary | ICD-10-CM

## 2024-01-24 DIAGNOSIS — I2583 Coronary atherosclerosis due to lipid rich plaque: Secondary | ICD-10-CM

## 2024-01-24 MED ORDER — REPATHA SURECLICK 140 MG/ML ~~LOC~~ SOAJ: 1.0000 mL | SUBCUTANEOUS | 3 refills | Status: DC

## 2024-01-24 MED ORDER — NITROGLYCERIN 0.4 MG SL SUBL
0.4000 mg | SUBLINGUAL_TABLET | SUBLINGUAL | 3 refills | Status: AC | PRN
Start: 2024-01-24 — End: 2025-01-23

## 2024-01-24 NOTE — Addendum Note (Signed)
 Addended by: Sunny English on: 01/24/2024 11:28 AM   Modules accepted: Orders

## 2024-01-24 NOTE — Patient Instructions (Signed)
 Medication Instructions:  Refilled nitroglycerin   *If you need a refill on your cardiac medications before your next appointment, please call your pharmacy*  Lab Work: NONE If you have labs (blood work) drawn today and your tests are completely normal, you will receive your results only by: MyChart Message (if you have MyChart) OR A paper copy in the mail If you have any lab test that is abnormal or we need to change your treatment, we will call you to review the results.  Testing/Procedures: NONE  Follow-Up: At [facility], you and your health needs are our priority.  As part of our continuing mission to provide you with exceptional heart care, our providers are all part of one team.  This team includes your primary Cardiologist (physician) and Advanced Practice Providers or APPs (Physician Assistants and Nurse Practitioners) who all work together to provide you with the care you need, when you need it.  Your next appointment:   6 month(s)  Provider:   Alexandria Angel, MD    We recommend signing up for the patient portal called "MyChart".  Sign up information is provided on this After Visit Summary.  MyChart is used to connect with patients for Virtual Visits (Telemedicine).  Patients are able to view lab/test results, encounter notes, upcoming appointments, etc.  Non-urgent messages can be sent to your provider as well.   To learn more about what you can do with MyChart, go to [email or web address].   Other Instructions

## 2024-01-25 DIAGNOSIS — M48061 Spinal stenosis, lumbar region without neurogenic claudication: Secondary | ICD-10-CM | POA: Diagnosis not present

## 2024-01-25 DIAGNOSIS — M4317 Spondylolisthesis, lumbosacral region: Secondary | ICD-10-CM | POA: Diagnosis not present

## 2024-01-25 DIAGNOSIS — M47816 Spondylosis without myelopathy or radiculopathy, lumbar region: Secondary | ICD-10-CM | POA: Diagnosis not present

## 2024-01-25 DIAGNOSIS — M5126 Other intervertebral disc displacement, lumbar region: Secondary | ICD-10-CM | POA: Diagnosis not present

## 2024-01-25 DIAGNOSIS — M5135 Other intervertebral disc degeneration, thoracolumbar region: Secondary | ICD-10-CM | POA: Diagnosis not present

## 2024-02-16 DIAGNOSIS — M4317 Spondylolisthesis, lumbosacral region: Secondary | ICD-10-CM | POA: Diagnosis not present

## 2024-02-16 DIAGNOSIS — Z6834 Body mass index (BMI) 34.0-34.9, adult: Secondary | ICD-10-CM | POA: Diagnosis not present

## 2024-03-14 ENCOUNTER — Other Ambulatory Visit: Payer: Self-pay | Admitting: Cardiology

## 2024-04-10 DIAGNOSIS — H5203 Hypermetropia, bilateral: Secondary | ICD-10-CM | POA: Diagnosis not present

## 2024-04-10 DIAGNOSIS — H2513 Age-related nuclear cataract, bilateral: Secondary | ICD-10-CM | POA: Diagnosis not present

## 2024-04-12 DIAGNOSIS — H2513 Age-related nuclear cataract, bilateral: Secondary | ICD-10-CM | POA: Diagnosis not present

## 2024-05-11 DIAGNOSIS — Z961 Presence of intraocular lens: Secondary | ICD-10-CM | POA: Diagnosis not present

## 2024-05-11 DIAGNOSIS — H2512 Age-related nuclear cataract, left eye: Secondary | ICD-10-CM | POA: Diagnosis not present

## 2024-05-11 DIAGNOSIS — H25812 Combined forms of age-related cataract, left eye: Secondary | ICD-10-CM | POA: Diagnosis not present

## 2024-05-18 DIAGNOSIS — Z961 Presence of intraocular lens: Secondary | ICD-10-CM | POA: Diagnosis not present

## 2024-05-18 DIAGNOSIS — H2511 Age-related nuclear cataract, right eye: Secondary | ICD-10-CM | POA: Diagnosis not present

## 2024-05-18 DIAGNOSIS — H25811 Combined forms of age-related cataract, right eye: Secondary | ICD-10-CM | POA: Diagnosis not present

## 2024-06-16 DIAGNOSIS — D3132 Benign neoplasm of left choroid: Secondary | ICD-10-CM | POA: Diagnosis not present

## 2024-06-16 DIAGNOSIS — Z008 Encounter for other general examination: Secondary | ICD-10-CM | POA: Diagnosis not present

## 2024-06-21 DIAGNOSIS — M7062 Trochanteric bursitis, left hip: Secondary | ICD-10-CM | POA: Diagnosis not present

## 2024-07-04 DIAGNOSIS — L820 Inflamed seborrheic keratosis: Secondary | ICD-10-CM | POA: Diagnosis not present

## 2024-07-04 DIAGNOSIS — L578 Other skin changes due to chronic exposure to nonionizing radiation: Secondary | ICD-10-CM | POA: Diagnosis not present

## 2024-07-04 DIAGNOSIS — L821 Other seborrheic keratosis: Secondary | ICD-10-CM | POA: Diagnosis not present

## 2024-07-04 DIAGNOSIS — D225 Melanocytic nevi of trunk: Secondary | ICD-10-CM | POA: Diagnosis not present

## 2024-07-04 DIAGNOSIS — L570 Actinic keratosis: Secondary | ICD-10-CM | POA: Diagnosis not present

## 2024-07-04 DIAGNOSIS — L814 Other melanin hyperpigmentation: Secondary | ICD-10-CM | POA: Diagnosis not present

## 2024-07-04 DIAGNOSIS — D485 Neoplasm of uncertain behavior of skin: Secondary | ICD-10-CM | POA: Diagnosis not present

## 2024-07-28 DIAGNOSIS — M7062 Trochanteric bursitis, left hip: Secondary | ICD-10-CM | POA: Diagnosis not present

## 2024-07-28 DIAGNOSIS — M545 Low back pain, unspecified: Secondary | ICD-10-CM | POA: Diagnosis not present

## 2024-08-04 DIAGNOSIS — M7062 Trochanteric bursitis, left hip: Secondary | ICD-10-CM | POA: Diagnosis not present

## 2024-08-04 DIAGNOSIS — M25552 Pain in left hip: Secondary | ICD-10-CM | POA: Diagnosis not present

## 2024-08-07 ENCOUNTER — Other Ambulatory Visit (HOSPITAL_COMMUNITY): Payer: Self-pay

## 2024-08-07 ENCOUNTER — Telehealth: Payer: Self-pay | Admitting: Cardiology

## 2024-08-07 ENCOUNTER — Telehealth: Payer: Self-pay | Admitting: Pharmacy Technician

## 2024-08-07 NOTE — Telephone Encounter (Signed)
 Waiting on diagnosis

## 2024-08-07 NOTE — Telephone Encounter (Signed)
 Patient would like to know if he needs to reapply for another grant for repatha .

## 2024-08-07 NOTE — Telephone Encounter (Signed)
 Pt c/o medication issue:  1. Name of Medication:   Evolocumab  (REPATHA  SURECLICK) 140 MG/ML SOAJ    2. How are you currently taking this medication (dosage and times per day)? As written   3. Are you having a reaction (difficulty breathing--STAT)? No   4. What is your medication issue? Pt would like c/b from Pharmacist Medford P.to discuss medication  Please advise

## 2024-08-08 DIAGNOSIS — M7062 Trochanteric bursitis, left hip: Secondary | ICD-10-CM | POA: Diagnosis not present

## 2024-08-08 DIAGNOSIS — M25552 Pain in left hip: Secondary | ICD-10-CM | POA: Diagnosis not present

## 2024-08-10 DIAGNOSIS — M25552 Pain in left hip: Secondary | ICD-10-CM | POA: Diagnosis not present

## 2024-08-10 DIAGNOSIS — M7062 Trochanteric bursitis, left hip: Secondary | ICD-10-CM | POA: Diagnosis not present

## 2024-08-15 DIAGNOSIS — M25552 Pain in left hip: Secondary | ICD-10-CM | POA: Diagnosis not present

## 2024-08-15 DIAGNOSIS — M7062 Trochanteric bursitis, left hip: Secondary | ICD-10-CM | POA: Diagnosis not present

## 2024-08-21 DIAGNOSIS — M7062 Trochanteric bursitis, left hip: Secondary | ICD-10-CM | POA: Diagnosis not present

## 2024-08-21 DIAGNOSIS — M25552 Pain in left hip: Secondary | ICD-10-CM | POA: Diagnosis not present

## 2024-08-22 ENCOUNTER — Other Ambulatory Visit: Payer: Self-pay | Admitting: Student

## 2024-08-22 DIAGNOSIS — R072 Precordial pain: Secondary | ICD-10-CM

## 2024-09-12 ENCOUNTER — Telehealth: Payer: Self-pay | Admitting: Cardiology

## 2024-09-12 DIAGNOSIS — E785 Hyperlipidemia, unspecified: Secondary | ICD-10-CM

## 2024-09-12 DIAGNOSIS — I2510 Atherosclerotic heart disease of native coronary artery without angina pectoris: Secondary | ICD-10-CM

## 2024-09-12 NOTE — Telephone Encounter (Signed)
 Pt c/o medication issue:  1. Name of Medication:   Evolocumab  (REPATHA  SURECLICK) 140 MG/ML SOAJ    2. How are you currently taking this medication (dosage and times per day)? As written  3. Are you having a reaction (difficulty breathing--STAT)? no  4. What is your medication issue? Pt said he spoke to someone in our office and said he was approved for grant for 2026. He went to [facility] to pick up medication and was told it was over $700. Please advise.

## 2024-09-13 ENCOUNTER — Other Ambulatory Visit (HOSPITAL_COMMUNITY): Payer: Self-pay

## 2024-09-13 MED ORDER — REPATHA SURECLICK 140 MG/ML ~~LOC~~ SOAJ
1.0000 mL | SUBCUTANEOUS | 3 refills | Status: AC
  Filled 2024-09-13: qty 6, 84d supply, fill #0

## 2024-09-13 NOTE — Telephone Encounter (Signed)
 Added information to [facility] and let patient know
